# Patient Record
Sex: Female | Born: 1990 | Race: White | Hispanic: No | Marital: Married | Smoking: Current every day smoker
Health system: Southern US, Community
[De-identification: ages and names within clinical notes are randomized; demographics above are authoritative.]

## PROBLEM LIST (undated history)

## (undated) ENCOUNTER — Inpatient Hospital Stay: Payer: Self-pay

## (undated) DIAGNOSIS — F329 Major depressive disorder, single episode, unspecified: Secondary | ICD-10-CM

## (undated) DIAGNOSIS — E669 Obesity, unspecified: Secondary | ICD-10-CM

## (undated) DIAGNOSIS — F419 Anxiety disorder, unspecified: Secondary | ICD-10-CM

## (undated) DIAGNOSIS — E785 Hyperlipidemia, unspecified: Secondary | ICD-10-CM

## (undated) DIAGNOSIS — T8859XA Other complications of anesthesia, initial encounter: Secondary | ICD-10-CM

## (undated) DIAGNOSIS — F32A Depression, unspecified: Secondary | ICD-10-CM

## (undated) DIAGNOSIS — T4145XA Adverse effect of unspecified anesthetic, initial encounter: Secondary | ICD-10-CM

## (undated) HISTORY — DX: Depression, unspecified: F32.A

## (undated) HISTORY — DX: Obesity, unspecified: E66.9

## (undated) HISTORY — DX: Major depressive disorder, single episode, unspecified: F32.9

## (undated) HISTORY — DX: Hyperlipidemia, unspecified: E78.5

## (undated) HISTORY — DX: Anxiety disorder, unspecified: F41.9

## (undated) HISTORY — PX: ANKLE SURGERY: SHX546

## (undated) HISTORY — PX: TONSILLECTOMY: SUR1361

## (undated) HISTORY — PX: FRACTURE SURGERY: SHX138

---
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
## 2012-01-23 ENCOUNTER — Emergency Department: Payer: Self-pay | Admitting: Emergency Medicine

## 2012-01-23 LAB — CBC WITH DIFFERENTIAL/PLATELET
Basophil #: 0 10*3/uL (ref 0.0–0.1)
Eosinophil %: 1.7 %
HCT: 37 % (ref 35.0–47.0)
Lymphocyte #: 2.4 10*3/uL (ref 1.0–3.6)
Lymphocyte %: 28.6 %
Monocyte %: 5.2 %
Neutrophil %: 64.1 %
Platelet: 189 10*3/uL (ref 150–440)
RBC: 4.44 10*6/uL (ref 3.80–5.20)
RDW: 14 % (ref 11.5–14.5)
WBC: 8.4 10*3/uL (ref 3.6–11.0)

## 2012-01-23 LAB — URINALYSIS, COMPLETE
Bacteria: NONE SEEN
Bilirubin,UR: NEGATIVE
Blood: NEGATIVE
RBC,UR: 1 /HPF (ref 0–5)
Specific Gravity: 1.02 (ref 1.003–1.030)
Squamous Epithelial: 11

## 2012-01-23 LAB — BASIC METABOLIC PANEL
Anion Gap: 15 (ref 7–16)
BUN: 9 mg/dL (ref 7–18)
Calcium, Total: 9 mg/dL (ref 8.5–10.1)
Chloride: 103 mmol/L (ref 98–107)
Co2: 24 mmol/L (ref 21–32)
Creatinine: 0.67 mg/dL (ref 0.60–1.30)
EGFR (Non-African Amer.): >60
Glucose: 108 mg/dL — ABNORMAL HIGH (ref 65–99)
Osmolality: 282 (ref 275–301)

## 2012-01-23 LAB — TROPONIN I: Troponin-I: <0.02 ng/mL

## 2013-02-26 IMAGING — CT CT CHEST W/ CM
2 series · 15 of 31 positions shown, 19 images · IV contrast (APPLIED)
Comparison: none

REASON FOR EXAM: sudden onset pleuritiic cp in smoker on bcp
COMMENTS:

[Series 4: soft tissue · axial · 0.69mm/px · z∈[-325,-283]mm · 2 of 94 slices shown]
[im 8/94  mediastinal]
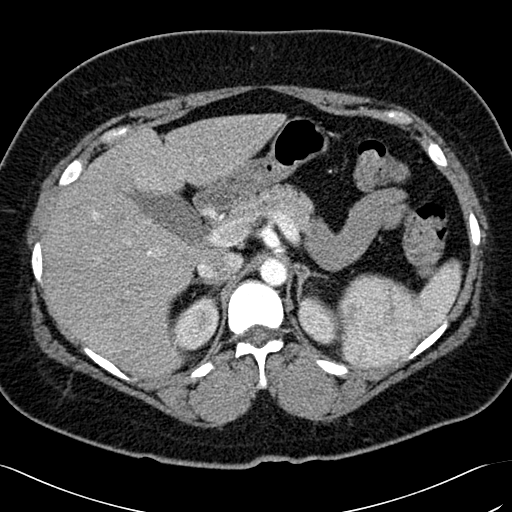
[im 22/94  mediastinal]
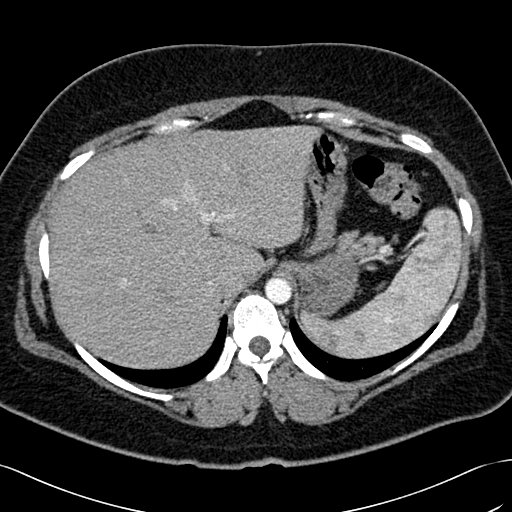

[Series 5: lung windows · axial · 0.69mm/px · z∈[-319,-88]mm · 13 of 92 slices shown, 17 images]
[im 8/92  mediastinal]
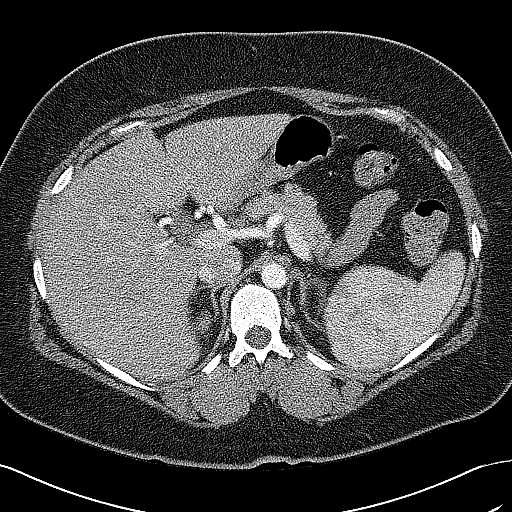
[im 8/92  lung]
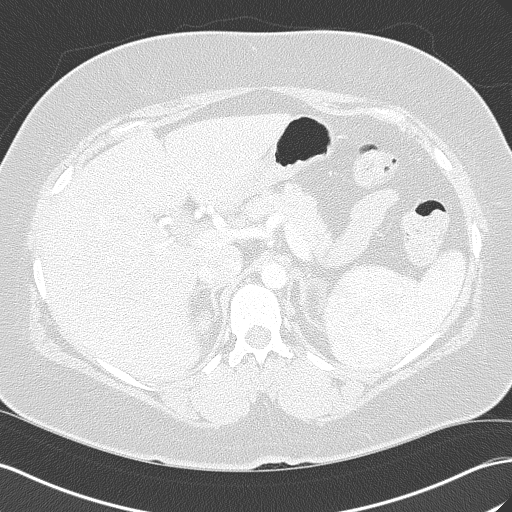
[im 15/92  lung]
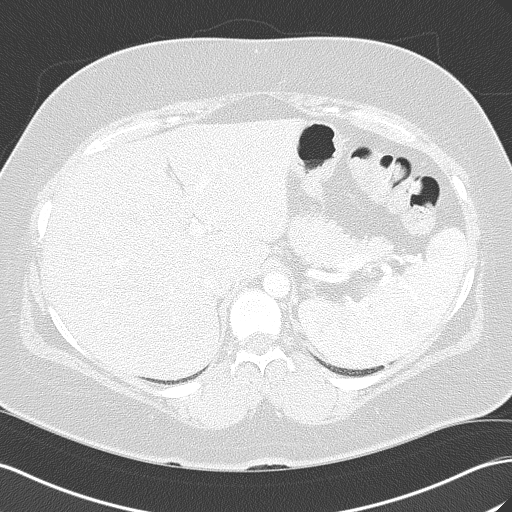
[im 22/92  lung]
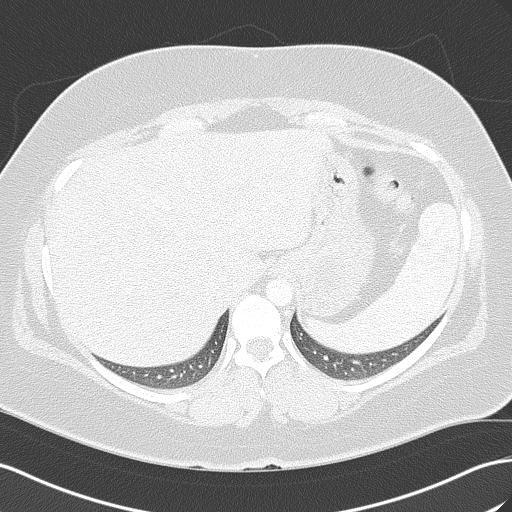
[im 29/92  lung]
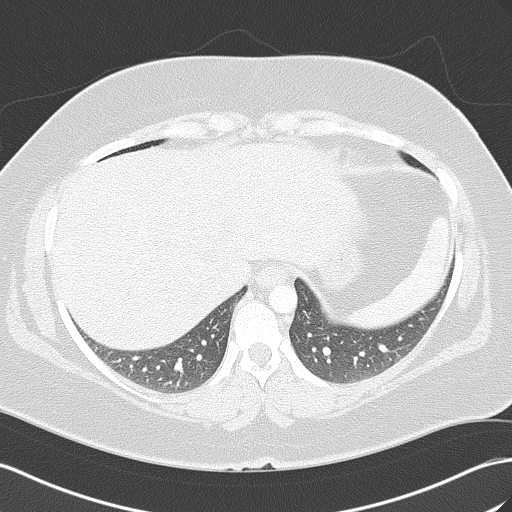
[im 36/92  mediastinal]
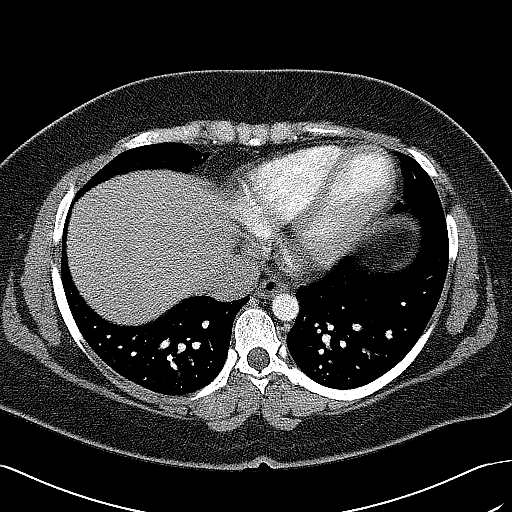
[im 36/92  lung]
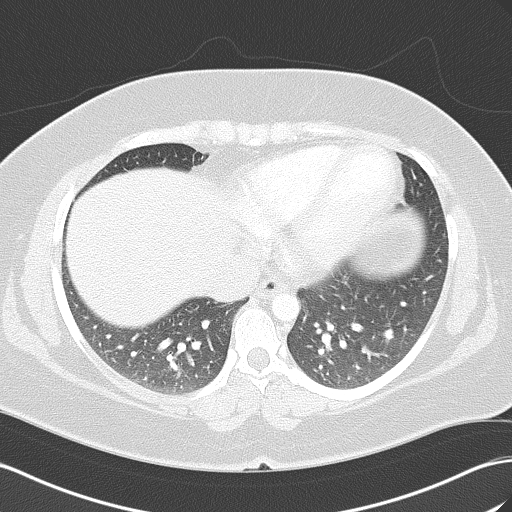
[im 43/92  lung]
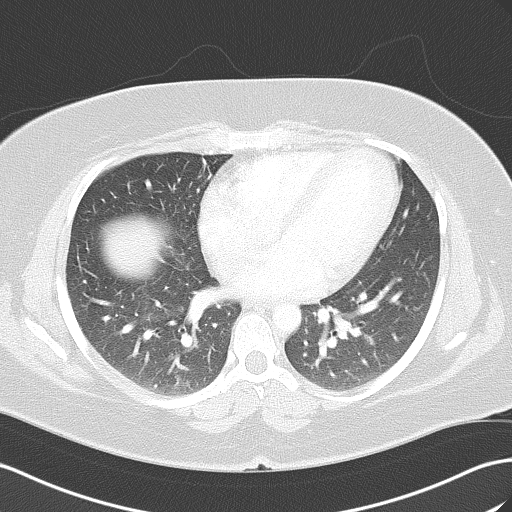
[im 46/92  lung]
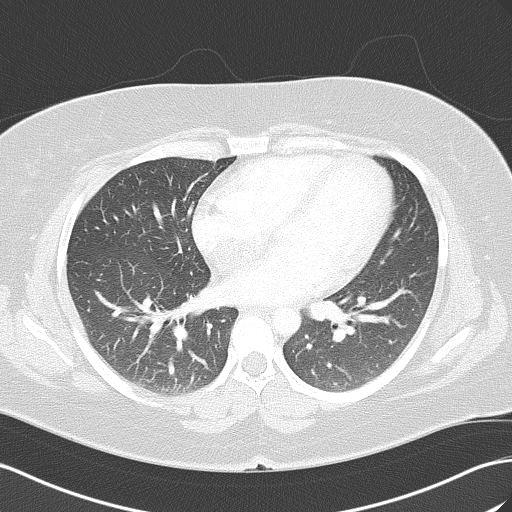
[im 50/92  lung]
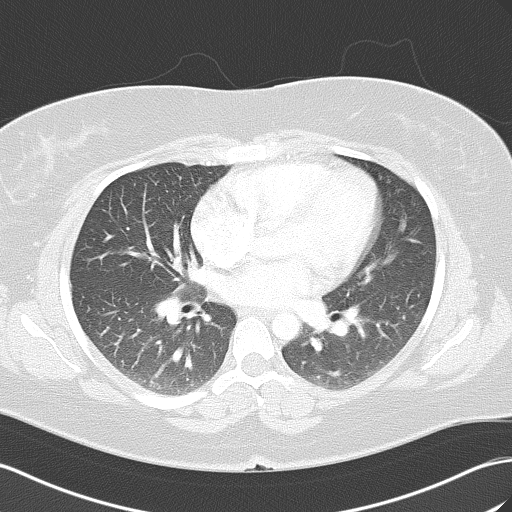
[im 57/92  mediastinal]
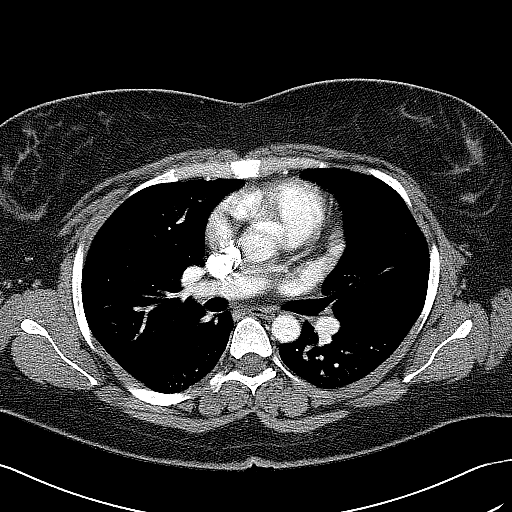
[im 57/92  lung]
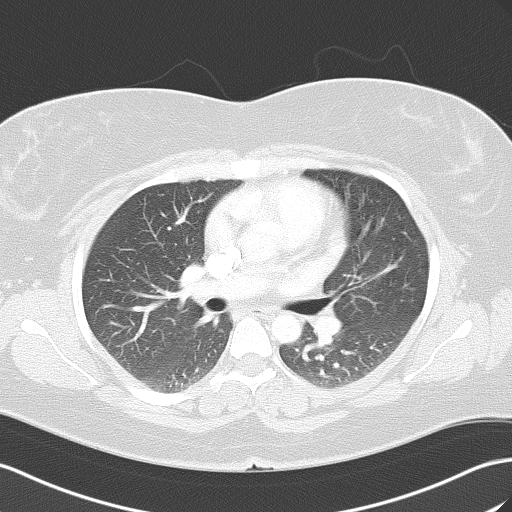
[im 64/92  lung]
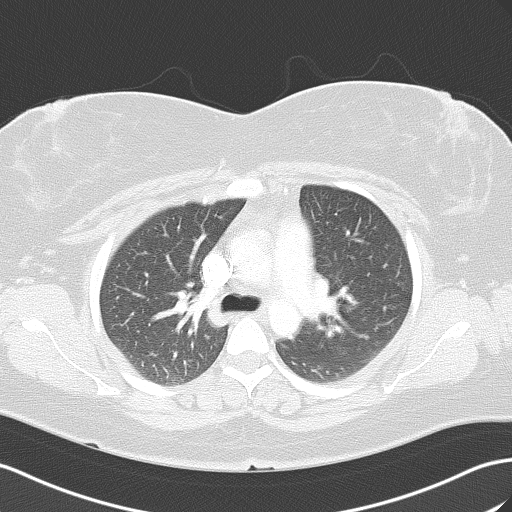
[im 71/92  lung]
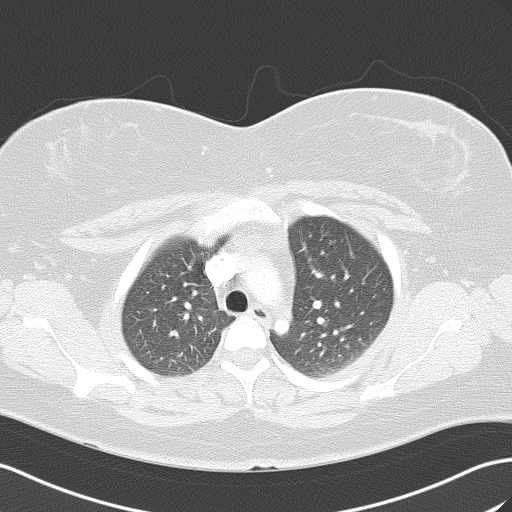
[im 78/92  lung]
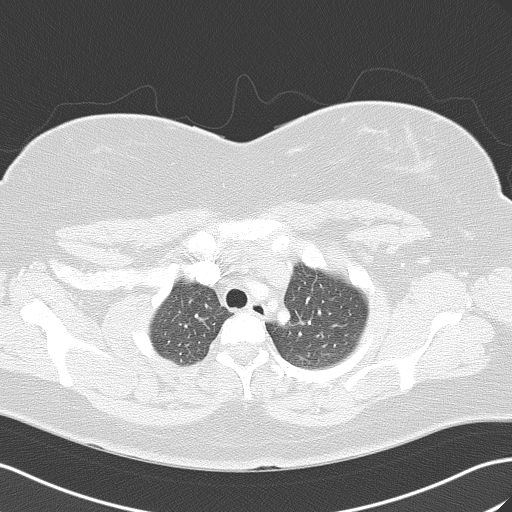
[im 85/92  mediastinal]
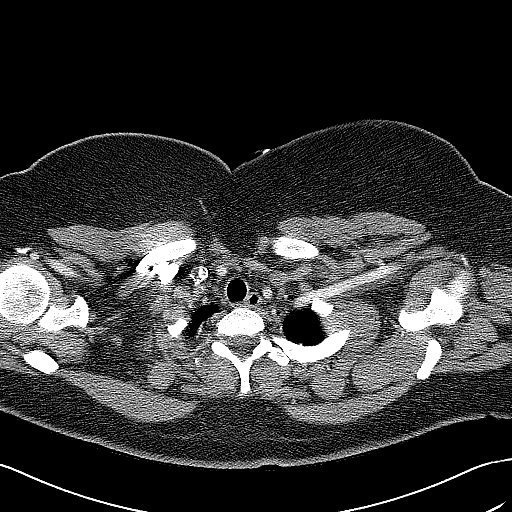
[im 85/92  lung]
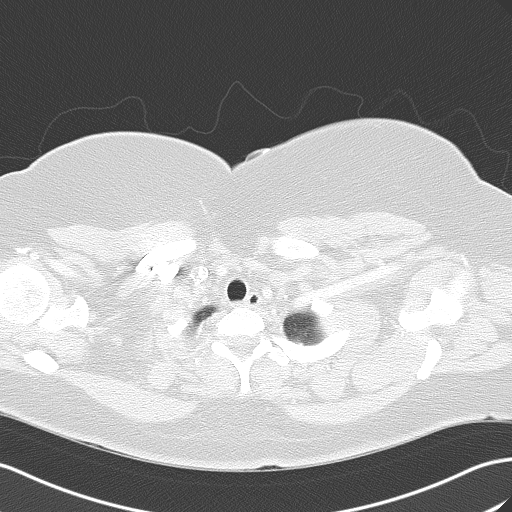

[15 of 31 positions shown; findings below may reference images not displayed]

PROCEDURE:     CT  - CT CHEST (FOR PE) W  - January 23, 2012  [DATE]

RESULT:     Axial CT scanning was performed through the chest with
reconstructions at 3 mm intervals and slice thicknesses following
intravenous administration of 100 cc of Jsovue-BH4. Review of multiplanar
reconstructed images separately on the VIA monitor.

The cardiac chambers are enlarged. The caliber of the thoracic aorta is
normal. Contrast within the pulmonary arterial tree is normal in appearance.
I see no pleural nor pericardial effusion. There is no pneumothorax nor
pneumomediastinum.

At lung window settings I see no interstitial nor alveolar infiltrate. There
is a 3 mm diameter subpleural nodule in the right upper lobe anteriorly on
image 34. The thoracic vertebral bodies are preserved in height. Within the
upper abdomen the observed portions of the liver and spleen are normal. I
see no adrenal masses.
IMPRESSION: 1. I do not see evidence of acute pulmonary embolism.
2. There is mild enlargement of the cardiac chambers without evidence of CHF.

A preliminary report was sent to the [HOSPITAL] the conclusion
of the study.

## 2015-08-03 ENCOUNTER — Ambulatory Visit (INDEPENDENT_AMBULATORY_CARE_PROVIDER_SITE_OTHER): Payer: 59 | Admitting: Primary Care

## 2015-08-03 ENCOUNTER — Encounter: Payer: Self-pay | Admitting: Primary Care

## 2015-08-03 ENCOUNTER — Encounter: Payer: Self-pay | Admitting: Radiology

## 2015-08-03 ENCOUNTER — Encounter (INDEPENDENT_AMBULATORY_CARE_PROVIDER_SITE_OTHER): Payer: Self-pay

## 2015-08-03 VITALS — BP 116/72 | HR 90 | Temp 97.7°F | Ht 68.0 in | Wt 244.8 lb

## 2015-08-03 DIAGNOSIS — R05 Cough: Secondary | ICD-10-CM

## 2015-08-03 DIAGNOSIS — F418 Other specified anxiety disorders: Secondary | ICD-10-CM

## 2015-08-03 DIAGNOSIS — F419 Anxiety disorder, unspecified: Secondary | ICD-10-CM

## 2015-08-03 DIAGNOSIS — R059 Cough, unspecified: Secondary | ICD-10-CM

## 2015-08-03 DIAGNOSIS — F329 Major depressive disorder, single episode, unspecified: Secondary | ICD-10-CM | POA: Insufficient documentation

## 2015-08-03 MED ORDER — SERTRALINE HCL 50 MG PO TABS: 50.0000 mg | ORAL_TABLET | Freq: Every day | ORAL | Status: DC

## 2015-08-03 MED ORDER — ALPRAZOLAM 0.5 MG PO TABS: 0.5000 mg | ORAL_TABLET | Freq: Two times a day (BID) | ORAL | Status: DC | PRN

## 2015-08-03 MED ORDER — BENZONATATE 200 MG PO CAPS: 200.0000 mg | ORAL_CAPSULE | Freq: Three times a day (TID) | ORAL | Status: DC | PRN

## 2015-08-03 NOTE — Patient Instructions (Addendum)
Start Sertraline (Zoloft) tablets for depression and anxiety. Take 1/2 tablet by mouth once daily for 6 days, then take 1 full tablet thereafter.  Use alprazolam sparingly as needed for anxiety. We will send this to mail order. The goal is to wean you back from using this medication.  You may take the Benzonatate capsules for cough. Take 1 capsule by mouth three times daily as needed for cough.  Follow up in 6 weeks for re-evaluation of depression and anxiety.  It was a pleasure to meet you today! Please don't hesitate to call me with any questions. Welcome to [facility]!

## 2015-08-03 NOTE — Assessment & Plan Note (Signed)
History of both.  Once treated on citalopram and wellbutrin with improvement, has not been on these meds in years. She is currently managed on alprazolam once to twice daily. The goal is to wean off alprazolam. Start Zoloft 50 mg for anxiety and depression. Refill provided for xanax, UDS and controlled substance contract obtained. Discussed potential side effects of Zoloft, She is to follow up in 6 weeks for re-evaluation.

## 2015-08-03 NOTE — Progress Notes (Signed)
Pre visit review using our clinic review tool, if applicable. No additional management support is needed unless otherwise documented below in the visit note. 

## 2015-08-03 NOTE — Progress Notes (Signed)
Subjective:    Patient ID: Terry Zhang, female    DOB: 1991/02/26, 24 y.o.   MRN: [id number]  HPI  Terry Zhang is a 24 year old female who presents today to establish care and discuss the problems mentioned below. Will obtain old records.  1) Generalized Anxiety Disorder: She has daily worry, difficulty controlling the worry, will get muscle tension, and difficulty sleeping at night. She will take alprazolam 0.5 mg once in the morning and again at night, but mostly will take it once daily. She was once managed on citalopram and wellbutrin with improvement in depression and anxiety. PHQ-9 score of 14 in office. Denies SI/HI  2) Cough: Started 3 weeks ago. Was seen at work clinic and prescribed Augmentin and prednisone. She's starting to feel better overall but continues to cough. She was not provided anything for cough during her prior visit at work clinic.  Review of Systems  Constitutional: Negative for fever and chills.  HENT: Negative for congestion, ear pain, sinus pressure and sore throat.   Respiratory: Positive for cough and chest tightness. Negative for shortness of breath.   Cardiovascular: Negative for chest pain.  Gastrointestinal: Negative for diarrhea and constipation.  Genitourinary: Negative for difficulty urinating.       Regular periods  Musculoskeletal: Negative for myalgias and arthralgias.  Skin: Negative for rash.  Neurological: Negative for dizziness and headaches.  Psychiatric/Behavioral: Negative for suicidal ideas. The patient is nervous/anxious.        See HPI       Past Medical History  Diagnosis Date  . Depression   . Hyperlipidemia     History   Social History  . Marital Status: Unknown    Spouse Name: N/A  . Number of Children: N/A  . Years of Education: N/A   Occupational History  . Not on file.   Social History Main Topics  . Smoking status: Never Smoker   . Smokeless tobacco: Not on file  . Alcohol Use: No  . Drug Use: Not  on file  . Sexual Activity: Not on file   Other Topics Concern  . Not on file   Social History Narrative   Married.   1 daughter.   Works as a Writer at [employer].   Once enjoyed playing sports, going to [employer], swimming.    Past Surgical History  Procedure Laterality Date  . Breast biopsy  2003  . Appendectomy  2008    Family History  Problem Relation Age of Onset  . Arthritis Mother   . Cancer Mother     ovary  . Hyperlipidemia Mother   . Mental illness Mother   . Diabetes Mother   . Hyperlipidemia Father   . Hypertension Father   . Mental illness Father   . Mental illness Maternal Grandfather   . Diabetes Maternal Grandfather     No Known Allergies  No current outpatient prescriptions on file prior to visit.   No current facility-administered medications on file prior to visit.    BP 116/72 mmHg  Pulse 90  Temp(Src) 97.7 F (36.5 C) (Oral)  Ht  (1.727 m)  Wt 244 lb 12.8 oz (111.041 kg)  BMI 37.23 kg/m2  SpO2 95%  LMP 07/29/2015    Objective:   Physical Exam  Constitutional: She is oriented to person, place, and time. She appears well-nourished.  Cough present on exam  HENT:  Right Ear: Tympanic membrane and ear canal normal.  Left Ear: Tympanic membrane  and ear canal normal.  Nose: Right sinus exhibits no maxillary sinus tenderness and no frontal sinus tenderness. Left sinus exhibits no maxillary sinus tenderness and no frontal sinus tenderness.  Mouth/Throat: Oropharynx is clear and moist.  Eyes: Conjunctivae are normal. Pupils are equal, round, and reactive to light.  Neck: Neck supple.  Cardiovascular: Normal rate and regular rhythm.   Pulmonary/Chest: Effort normal. She has rhonchi in the left upper field.  Lymphadenopathy:    She has no cervical adenopathy.  Neurological: She is alert and oriented to person, place, and time.  Skin: Skin is warm and dry.  Psychiatric: She has a normal mood and affect.            Assessment & Plan:  Cough:  Present for 3 weeks. Currently being treated with Augmentin and prednisone. Will send RX for tessalon pearls for daytime cough as it interferes with work. Push fluids. Follow up PRN

## 2015-09-15 ENCOUNTER — Ambulatory Visit: Payer: 59 | Admitting: Primary Care

## 2015-09-28 ENCOUNTER — Ambulatory Visit (INDEPENDENT_AMBULATORY_CARE_PROVIDER_SITE_OTHER): Payer: 59 | Admitting: Primary Care

## 2015-09-28 ENCOUNTER — Encounter (INDEPENDENT_AMBULATORY_CARE_PROVIDER_SITE_OTHER): Payer: Self-pay

## 2015-09-28 ENCOUNTER — Encounter: Payer: Self-pay | Admitting: Primary Care

## 2015-09-28 VITALS — BP 114/70 | HR 101 | Temp 98.0°F | Ht 68.0 in | Wt 247.1 lb

## 2015-09-28 DIAGNOSIS — F419 Anxiety disorder, unspecified: Principal | ICD-10-CM

## 2015-09-28 DIAGNOSIS — F329 Major depressive disorder, single episode, unspecified: Secondary | ICD-10-CM

## 2015-09-28 DIAGNOSIS — F418 Other specified anxiety disorders: Secondary | ICD-10-CM | POA: Diagnosis not present

## 2015-09-28 MED ORDER — SERTRALINE HCL 100 MG PO TABS: 100.0000 mg | ORAL_TABLET | Freq: Every day | ORAL | Status: DC

## 2015-09-28 NOTE — Patient Instructions (Signed)
Start taking Sertraline (Zoloft) 100 mg daily. You may take 2 of your 50 mg tablets until your bottle is empty.  Continue to use Xanax as needed with a goal of weaning off.  Please schedule a follow up appointment in 1 month.  It was a pleasure to see you today!

## 2015-09-28 NOTE — Progress Notes (Signed)
Pre visit review using our clinic review tool, if applicable. No additional management support is needed unless otherwise documented below in the visit note. 

## 2015-09-28 NOTE — Progress Notes (Signed)
Subjective:    Patient ID: Terry Zhang, female    DOB: Dec 28, 1991, 24 y.o.   MRN: [id number]  HPI  Terry Zhang is a 24 year old female who presents today for follow up of anxiety and depression. She was evaluated as a new patient in early August 2016 with reports of anxiety and sadness. She had a PHQ 9 score of 14 and also met criteria for GAD. She was initiated on Zoloft 50 mg with a refill provided for Xanax (previously taking).  Since her last visit she feels improved, but continues to feel anxious daily. She's engaging in more activities and is able to reduce anxiety regarding "the little things". She's communicating more with her husband regarding things that bother her. Denies headaches, GI upset or nausea. She is still requiring Xanax once to twice daily on average. Overall she's noticed improvement but doesn't feel 100% better and managed.   Review of Systems  Respiratory: Negative for shortness of breath.   Cardiovascular: Negative for chest pain.  Gastrointestinal: Negative for nausea and abdominal pain.  Neurological: Negative for headaches.  Psychiatric/Behavioral: Negative for suicidal ideas. The patient is nervous/anxious.        Past Medical History  Diagnosis Date  . Depression   . Hyperlipidemia     Social History   Social History  . Marital Status: Unknown    Spouse Name: N/A  . Number of Children: N/A  . Years of Education: N/A   Occupational History  . Not on file.   Social History Main Topics  . Smoking status: Never Smoker   . Smokeless tobacco: Not on file  . Alcohol Use: No  . Drug Use: Not on file  . Sexual Activity: Not on file   Other Topics Concern  . Not on file   Social History Narrative   Married.   1 daughter.   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   Once enjoyed playing sports, going to [employer], swimming.    Past Surgical History  Procedure Laterality Date  . Breast biopsy  2003  . Appendectomy  2008     Family History  Problem Relation Age of Onset  . Arthritis Mother   . Cancer Mother     ovary  . Hyperlipidemia Mother   . Mental illness Mother   . Diabetes Mother   . Hyperlipidemia Father   . Hypertension Father   . Mental illness Father   . Mental illness Maternal Grandfather   . Diabetes Maternal Grandfather     No Known Allergies  Current Outpatient Prescriptions on File Prior to Visit  Medication Sig Dispense Refill  . ALPRAZolam (XANAX) 0.5 MG tablet Take 1 tablet (0.5 mg total) by mouth 2 (two) times daily as needed for anxiety or sleep. 180 tablet 0  . benzonatate (TESSALON) 200 MG capsule Take 1 capsule (200 mg total) by mouth 3 (three) times daily as needed. 21 capsule 0  . ibuprofen (ADVIL,MOTRIN) 200 MG tablet Take 200 mg by mouth.    . norgestimate-ethinyl estradiol (PREVIFEM) 0.25-35 MG-MCG tablet daily.     No current facility-administered medications on file prior to visit.    BP 114/70 mmHg  Pulse 101  Temp(Src) 98 F (36.7 C) (Oral)  Ht '5\' 8"'  (1.727 m)  Wt 247 lb 1.9 oz (112.093 kg)  BMI 37.58 kg/m2  SpO2 97%  LMP 09/24/2015    Objective:   Physical Exam  Constitutional: She appears well-nourished.  Cardiovascular: Normal rate and  regular rhythm.   Pulmonary/Chest: Effort normal and breath sounds normal.  Skin: Skin is warm and dry.  Psychiatric: She has a normal mood and affect.          Assessment & Plan:

## 2015-09-28 NOTE — Assessment & Plan Note (Signed)
Improved with Zoloft, but not completely at goal. Still requiring xanax once to twice daily. Denies GI upset, headaches, nausea. Increase to 100 mg today. Explained the goal is to wean off xanax over time. Follow up in 1 month.

## 2015-10-11 ENCOUNTER — Other Ambulatory Visit: Payer: Self-pay | Admitting: Primary Care

## 2015-10-11 DIAGNOSIS — F32A Depression, unspecified: Secondary | ICD-10-CM

## 2015-10-11 DIAGNOSIS — F329 Major depressive disorder, single episode, unspecified: Secondary | ICD-10-CM

## 2015-10-11 DIAGNOSIS — F419 Anxiety disorder, unspecified: Principal | ICD-10-CM

## 2015-10-11 MED ORDER — ALPRAZOLAM 0.5 MG PO TABS: 0.5000 mg | ORAL_TABLET | Freq: Two times a day (BID) | ORAL | Status: DC | PRN

## 2015-10-11 NOTE — Telephone Encounter (Signed)
Faxed Rx to [facility] at [phone]

## 2015-10-11 NOTE — Telephone Encounter (Signed)
Received faxed refill request from [facility] for   Alprazolam (XANAX) 0.5 MG tablet 08/03/2015   Take 1 tablet (0.5 mg total) by mouth 2 (two) times daily as needed for anxiety or sleep.  Dispense 180  Refill  0  Last prescribed on 08/03/2015. Last seen on 09/28/15. Follow up on 10/27/15.

## 2015-10-27 ENCOUNTER — Ambulatory Visit: Payer: 59 | Admitting: Primary Care

## 2015-11-07 ENCOUNTER — Encounter: Payer: Self-pay | Admitting: Primary Care

## 2015-11-07 ENCOUNTER — Ambulatory Visit (INDEPENDENT_AMBULATORY_CARE_PROVIDER_SITE_OTHER): Payer: 59 | Admitting: Primary Care

## 2015-11-07 VITALS — BP 116/74 | HR 107 | Temp 98.3°F | Ht 68.0 in | Wt 251.8 lb

## 2015-11-07 DIAGNOSIS — F418 Other specified anxiety disorders: Secondary | ICD-10-CM

## 2015-11-07 DIAGNOSIS — F419 Anxiety disorder, unspecified: Principal | ICD-10-CM

## 2015-11-07 DIAGNOSIS — F329 Major depressive disorder, single episode, unspecified: Secondary | ICD-10-CM

## 2015-11-07 NOTE — Progress Notes (Signed)
Pre visit review using our clinic review tool, if applicable. No additional management support is needed unless otherwise documented below in the visit note. 

## 2015-11-07 NOTE — Assessment & Plan Note (Signed)
Improved since increase in Zoloft to 100 mg. Participating in family activities, improved focus and participation at work. Continue Zoloft 100 mg and Xanax PRN. Follow up in 3 months.

## 2015-11-07 NOTE — Patient Instructions (Signed)
Continue Zoloft 100 mg tablets. Continue Xanax as needed for anxiety.  Follow up in 3 months for re-evaluation.  It was a pleasure to see you today!

## 2015-11-07 NOTE — Progress Notes (Signed)
Subjective:    Patient ID: Terry Zhang, female    DOB: 1991/04/14, 24 y.o.   MRN: [id number]  HPI  [name] is a 24 year old female who presents today for follow up of anxiety and depression. She was re-evaluated in late September 2016 as she was initiated on Zoloft 50 mg in early August. Her dose last visit was increased to 100 mg as she had noticed improvement overall, but was still requiring frequent use of her Xanax.   Since her last visit she's feeling much improved. She's engaging in activites with her family and friends. She is feeling calmer at work and is able to participate in meetings. 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 Denies GI upset, nausea, headaches, SI/HI. She's requiring her Xanax only at bedtime, and very rarely during the day.   Review of Systems  Gastrointestinal: Negative for nausea and abdominal pain.  Neurological: Negative for headaches.  Psychiatric/Behavioral: Negative for suicidal ideas and sleep disturbance. The patient is not nervous/anxious.        Past Medical History  Diagnosis Date  . Depression   . Hyperlipidemia     Social History   Social History  . Marital Status: Unknown    Spouse Name: N/A  . Number of Children: N/A  . Years of Education: N/A   Occupational History  . Not on file.   Social History Main Topics  . Smoking status: Never Smoker   . Smokeless tobacco: Not on file  . Alcohol Use: No  . Drug Use: Not on file  . Sexual Activity: Not on file   Other Topics Concern  . Not on file   Social History Narrative   Married.   1 daughter.   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   Once enjoyed playing sports, going to [employer], swimming.    Past Surgical History  Procedure Laterality Date  . Breast biopsy  2003  . Appendectomy  2008    Family History  Problem Relation Age of Onset  . Arthritis Mother   . Cancer Mother     ovary  . Hyperlipidemia Mother   . Mental  illness Mother   . Diabetes Mother   . Hyperlipidemia Father   . Hypertension Father   . Mental illness Father   . Mental illness Maternal Grandfather   . Diabetes Maternal Grandfather     No Known Allergies  Current Outpatient Prescriptions on File Prior to Visit  Medication Sig Dispense Refill  . ALPRAZolam (XANAX) 0.5 MG tablet Take 1 tablet (0.5 mg total) by mouth 2 (two) times daily as needed for anxiety or sleep. 180 tablet 0  . ibuprofen (ADVIL,MOTRIN) 200 MG tablet Take 200 mg by mouth.    . norgestimate-ethinyl estradiol (PREVIFEM) 0.25-35 MG-MCG tablet daily.    . sertraline (ZOLOFT) 100 MG tablet Take 1 tablet (100 mg total) by mouth daily. 30 tablet 5   No current facility-administered medications on file prior to visit.    BP 116/74 mmHg  Pulse 107  Temp(Src) 98.3 F (36.8 C) (Oral)  Ht 5\' 8"  (1.727 m)  Wt 251 lb 12.8 oz (114.216 kg)  BMI 38.30 kg/m2  SpO2 97%  LMP 11/01/2015    Objective:   Physical Exam  Constitutional: She appears well-nourished.  Cardiovascular: Normal rate and regular rhythm.   Pulmonary/Chest: Effort normal and breath sounds normal.  Skin: Skin is warm and dry.  Psychiatric: She has a normal mood  and affect.          Assessment & Plan:

## 2015-12-08 ENCOUNTER — Telehealth: Payer: Self-pay | Admitting: Primary Care

## 2015-12-08 ENCOUNTER — Other Ambulatory Visit: Payer: Self-pay

## 2015-12-08 DIAGNOSIS — F329 Major depressive disorder, single episode, unspecified: Secondary | ICD-10-CM

## 2015-12-08 DIAGNOSIS — F419 Anxiety disorder, unspecified: Principal | ICD-10-CM

## 2015-12-08 MED ORDER — SERTRALINE HCL 100 MG PO TABS: 100.0000 mg | ORAL_TABLET | Freq: Every day | ORAL | Status: DC

## 2015-12-08 NOTE — Telephone Encounter (Signed)
Please notify Terry Zhang that I can authorize 1 month refill at twice daily. Once she's out we will decrease to once daily to help wean down. Should I send this into a local pharmacy since it will not be a 3 month supply? If so, then where?

## 2015-12-08 NOTE — Telephone Encounter (Signed)
Pt left v/m requesting refill alprazolam(last refilled # 180 on 10/11/15) ? Too early and sertraline(last refilled #30 x 5 on 09/28/15) to [facility]. Pt request cb. Please advise.last seen 11/07/15.

## 2015-12-08 NOTE — Telephone Encounter (Signed)
Called and notified patient of Kate's comments. Patient verbalized understanding. Patient stated that she is taking it BID and would like refill of the Xanax.

## 2015-12-11 ENCOUNTER — Telehealth: Payer: Self-pay | Admitting: Primary Care

## 2015-12-11 DIAGNOSIS — F419 Anxiety disorder, unspecified: Principal | ICD-10-CM

## 2015-12-11 DIAGNOSIS — F329 Major depressive disorder, single episode, unspecified: Secondary | ICD-10-CM

## 2015-12-11 MED ORDER — ALPRAZOLAM 0.5 MG PO TABS: 0.5000 mg | ORAL_TABLET | Freq: Two times a day (BID) | ORAL | Status: DC | PRN

## 2015-12-11 NOTE — Addendum Note (Signed)
Addended by: Tawnya CrookSAMBATH, Wayburn Shaler on: 12/11/2015 01:28 PM   Modules accepted: Orders

## 2015-12-11 NOTE — Telephone Encounter (Signed)
Called and notified patient of Kate's comments. Patient verbalized understanding. [facility] [place]

## 2015-12-11 NOTE — Telephone Encounter (Signed)
Printed the Rx. [facility]

## 2015-12-11 NOTE — Telephone Encounter (Signed)
I spoke with Ms. Brandow and will authorize [facility] supply [facility]. We will follow up in February as scheduled. Discussed how to wean down on medication. Johny DrillingChan, will you send in 3 month suppy [facility]? Same directions, no refills.  Thanks.

## 2015-12-11 NOTE — Telephone Encounter (Signed)
Faxed Rx to [facility].

## 2015-12-11 NOTE — Addendum Note (Signed)
Addended by: Tawnya CrookSAMBATH, Siomara Burkel on: 12/11/2015 02:31 PM   Modules accepted: Orders

## 2015-12-19 NOTE — Telephone Encounter (Signed)
Melissa with [facility] left v/m requesting supervising physician for Mayra ReelKate Clark NP. Ref # [id number]. Called and spoke with Savane at [facility] and Dr Daphine DeutscherBedsole's DEA and NPI given.

## 2016-02-07 ENCOUNTER — Encounter: Payer: Self-pay | Admitting: *Deleted

## 2016-02-07 ENCOUNTER — Ambulatory Visit (INDEPENDENT_AMBULATORY_CARE_PROVIDER_SITE_OTHER): Payer: 59 | Admitting: Primary Care

## 2016-02-07 ENCOUNTER — Encounter: Payer: Self-pay | Admitting: Primary Care

## 2016-02-07 VITALS — BP 116/70 | HR 91 | Temp 98.1°F | Ht 68.0 in | Wt 256.0 lb

## 2016-02-07 DIAGNOSIS — F418 Other specified anxiety disorders: Secondary | ICD-10-CM | POA: Diagnosis not present

## 2016-02-07 DIAGNOSIS — F32A Depression, unspecified: Secondary | ICD-10-CM

## 2016-02-07 DIAGNOSIS — F329 Major depressive disorder, single episode, unspecified: Secondary | ICD-10-CM

## 2016-02-07 DIAGNOSIS — F419 Anxiety disorder, unspecified: Principal | ICD-10-CM

## 2016-02-07 MED ORDER — SERTRALINE HCL 50 MG PO TABS: 50.0000 mg | ORAL_TABLET | Freq: Every day | ORAL | Status: DC

## 2016-02-07 NOTE — Patient Instructions (Addendum)
Start taking Zoloft 150 mg for anxiety and depression. Take both tablets once every morning.   Please call me in 4 weeks with an update.  Try to wean back off of your Alprazolam as discussed. Try taking Melatonin 1 hour prior to bedtime to help with sleep.  Please schedule a follow up appointment in 3 months.  It was a pleasure to see you today!

## 2016-02-07 NOTE — Progress Notes (Signed)
Pre visit review using our clinic review tool, if applicable. No additional management support is needed unless otherwise documented below in the visit note. 

## 2016-02-07 NOTE — Progress Notes (Signed)
Subjective:    Patient ID: Terry Zhang, female    DOB: Apr 03, 1991, 25 y.o.   MRN: [id number]  HPI  Terry Zhang is a 25 year old female who presents today for follow up of anxiety and depression. Initiated on Zoloft 50 mg and short term supply of Alprazolam in early August 2016. She is currently managed on Zoloft 100 mg and Alprazolam PRN. We discussed last visit that the goal would be to wean down/off of the Alprazolam.  Since her last visit she's taken on a new job and is adjusting. She is also going through stress with her young daughter who likely has a diagnosis of ADHD. She has been taking the Xanax three times weekly during the day, and at bedtime a few times weekly. She feels improved on the Zoloft, but doesn't feel quite at goal. Denies SI/HI.   Review of Systems  Gastrointestinal: Negative for nausea.  Neurological: Negative for headaches.  Psychiatric/Behavioral: Positive for sleep disturbance. Negative for suicidal ideas. The patient is nervous/anxious.        Past Medical History  Diagnosis Date  . Depression   . Hyperlipidemia     Social History   Social History  . Marital Status: Unknown    Spouse Name: N/A  . Number of Children: N/A  . Years of Education: N/A   Occupational History  . Not on file.   Social History Main Topics  . Smoking status: Never Smoker   . Smokeless tobacco: Not on file  . Alcohol Use: No  . Drug Use: Not on file  . Sexual Activity: Not on file   Other Topics Concern  . Not on file   Social History Narrative   Married.   1 daughter.   Works as a Writer at [employer].   Once enjoyed playing sports, going to [employer], swimming.    Past Surgical History  Procedure Laterality Date  . Breast biopsy  2003  . Appendectomy  2008    Family History  Problem Relation Age of Onset  . Arthritis Mother   . Cancer Mother     ovary  . Hyperlipidemia Mother   . Mental illness Mother   . Diabetes Mother   .  Hyperlipidemia Father   . Hypertension Father   . Mental illness Father   . Mental illness Maternal Grandfather   . Diabetes Maternal Grandfather     No Known Allergies  Current Outpatient Prescriptions on File Prior to Visit  Medication Sig Dispense Refill  . ALPRAZolam (XANAX) 0.5 MG tablet Take 1 tablet (0.5 mg total) by mouth 2 (two) times daily as needed for anxiety or sleep. 180 tablet 0  . ibuprofen (ADVIL,MOTRIN) 200 MG tablet Take 200 mg by mouth.    . norgestimate-ethinyl estradiol (PREVIFEM) 0.25-35 MG-MCG tablet daily.    . sertraline (ZOLOFT) 100 MG tablet Take 1 tablet (100 mg total) by mouth daily. 90 tablet 1   No current facility-administered medications on file prior to visit.    BP 116/70 mmHg  Pulse 91  Temp(Src) 98.1 F (36.7 C) (Oral)  Ht  (1.727 m)  Wt 256 lb (116.121 kg)  BMI 38.93 kg/m2  SpO2 98%  LMP 01/14/2016    Objective:   Physical Exam  Constitutional: She appears well-nourished.  Cardiovascular: Normal rate and regular rhythm.   Pulmonary/Chest: Effort normal and breath sounds normal.  Skin: Skin is warm and dry.  Psychiatric: She has a normal mood and affect.  Assessment & Plan:

## 2016-02-07 NOTE — Assessment & Plan Note (Addendum)
Improved on Zoloft 100 and is working to wean off alprazolam, but doesn't seem quite at goal. Will increase Zoloft to 150 mg daily, continue to wean down on alprazolam. Next RX will be for once daily PRN dosing. She is agreeable. She is to update me in 1 month regarding Zoloft dose. Follow up in 3 months in office. UDS due today.

## 2016-02-23 ENCOUNTER — Encounter: Payer: Self-pay | Admitting: Primary Care

## 2016-02-23 DIAGNOSIS — E785 Hyperlipidemia, unspecified: Secondary | ICD-10-CM

## 2016-02-23 HISTORY — DX: Hyperlipidemia, unspecified: E78.5

## 2016-03-06 ENCOUNTER — Telehealth: Payer: Self-pay | Admitting: Primary Care

## 2016-03-06 DIAGNOSIS — F329 Major depressive disorder, single episode, unspecified: Secondary | ICD-10-CM

## 2016-03-06 DIAGNOSIS — F419 Anxiety disorder, unspecified: Principal | ICD-10-CM

## 2016-03-06 NOTE — Telephone Encounter (Signed)
Will you check on Terry Zhang? We increased her Zoloft to 150 mg last visit. How's she doing?

## 2016-03-06 NOTE — Telephone Encounter (Signed)
Message left for patient to return my call.  

## 2016-03-07 MED ORDER — ALPRAZOLAM 0.5 MG PO TABS: 0.5000 mg | ORAL_TABLET | Freq: Every day | ORAL | Status: DC | PRN

## 2016-03-07 NOTE — Telephone Encounter (Signed)
Called and spoken to patient regarding her medication. Patient stated that she doing good right now. Also patient would like to get her Xanax refill and taking 1 tablet daily.

## 2016-03-07 NOTE — Addendum Note (Signed)
Addended by: Tawnya CrookSAMBATH, Mahreen Schewe on: 03/07/2016 11:33 AM   Modules accepted: Orders

## 2016-03-18 ENCOUNTER — Other Ambulatory Visit: Payer: Self-pay | Admitting: Primary Care

## 2016-03-18 DIAGNOSIS — F419 Anxiety disorder, unspecified: Principal | ICD-10-CM

## 2016-03-18 DIAGNOSIS — F329 Major depressive disorder, single episode, unspecified: Secondary | ICD-10-CM

## 2016-03-18 NOTE — Telephone Encounter (Signed)
Electronically refill request for   sertraline (ZOLOFT) 50 MG tablet   Take 1 tablet (50 mg total) by mouth daily.  Dispense: 90 tablet   Refills: 0     Last prescribed on 02/07/2016. Last seen on 02/07/2016. Follow up on 05/14/2016.

## 2016-03-22 NOTE — Telephone Encounter (Signed)
I don't see any documentation that this was phoned in.  [facility] was probably [facility].  Do you happen to know?

## 2016-03-22 NOTE — Telephone Encounter (Signed)
Pt left v/m requesting cb; pt wants to know if xanax was called in 03/07/16.

## 2016-03-22 NOTE — Telephone Encounter (Signed)
I did not see documentation that Rx had been phoned in, but I did see that it had been refilled. Phoned into [facility] and patient notified.

## 2016-04-25 ENCOUNTER — Emergency Department: Payer: 59

## 2016-04-25 ENCOUNTER — Encounter: Payer: Self-pay | Admitting: Emergency Medicine

## 2016-04-25 ENCOUNTER — Emergency Department
Admission: EM | Admit: 2016-04-25 | Discharge: 2016-04-25 | Disposition: A | Discharge status: Home / Self Care | Payer: 59 | Attending: Emergency Medicine | Admitting: Emergency Medicine

## 2016-04-25 DIAGNOSIS — E785 Hyperlipidemia, unspecified: Secondary | ICD-10-CM | POA: Insufficient documentation

## 2016-04-25 DIAGNOSIS — N83209 Unspecified ovarian cyst, unspecified side: Secondary | ICD-10-CM

## 2016-04-25 DIAGNOSIS — R102 Pelvic and perineal pain: Secondary | ICD-10-CM

## 2016-04-25 DIAGNOSIS — R1030 Lower abdominal pain, unspecified: Secondary | ICD-10-CM | POA: Insufficient documentation

## 2016-04-25 DIAGNOSIS — F329 Major depressive disorder, single episode, unspecified: Secondary | ICD-10-CM | POA: Insufficient documentation

## 2016-04-25 LAB — URINALYSIS COMPLETE WITH MICROSCOPIC (ARMC ONLY)
BILIRUBIN URINE: NEGATIVE
GLUCOSE, UA: NEGATIVE mg/dL
HGB URINE DIPSTICK: NEGATIVE
KETONES UR: NEGATIVE mg/dL
NITRITE: NEGATIVE
Protein, ur: NEGATIVE mg/dL
Specific Gravity, Urine: 1.014 (ref 1.005–1.030)
pH: 5 (ref 5.0–8.0)

## 2016-04-25 LAB — BASIC METABOLIC PANEL
ANION GAP: 12 (ref 5–15)
BUN: 13 mg/dL (ref 6–20)
CHLORIDE: 107 mmol/L (ref 101–111)
CO2: 20 mmol/L — AB (ref 22–32)
CREATININE: 0.68 mg/dL (ref 0.44–1.00)
Calcium: 9.3 mg/dL (ref 8.9–10.3)
GFR calc non Af Amer: >60 mL/min (ref >60)
GLUCOSE: 114 mg/dL — AB (ref 65–99)
Potassium: 4 mmol/L (ref 3.5–5.1)
Sodium: 139 mmol/L (ref 135–145)

## 2016-04-25 LAB — CBC
HCT: 38.6 % (ref 35.0–47.0)
Hemoglobin: 13.3 g/dL (ref 12.0–16.0)
MCH: 29.1 pg (ref 26.0–34.0)
MCHC: 34.3 g/dL (ref 32.0–36.0)
MCV: 84.6 fL (ref 80.0–100.0)
PLATELETS: 217 10*3/uL (ref 150–440)
RBC: 4.56 MIL/uL (ref 3.80–5.20)
RDW: 13.3 % (ref 11.5–14.5)
WBC: 8.4 10*3/uL (ref 3.6–11.0)

## 2016-04-25 LAB — POCT PREGNANCY, URINE: PREG TEST UR: NEGATIVE

## 2016-04-25 MED ORDER — KETOROLAC TROMETHAMINE 30 MG/ML IJ SOLN
15.0000 mg | Freq: Once | INTRAMUSCULAR | Status: AC
  Administered 2016-04-25: 15 mg via INTRAVENOUS
  Filled 2016-04-25: qty 1

## 2016-04-25 MED ORDER — HYDROCODONE-ACETAMINOPHEN 5-325 MG PO TABS
1.0000 | ORAL_TABLET | ORAL | Status: AC
  Administered 2016-04-25: 1 via ORAL
  Filled 2016-04-25: qty 1

## 2016-04-25 MED ORDER — ONDANSETRON HCL 4 MG/2ML IJ SOLN
4.0000 mg | Freq: Once | INTRAMUSCULAR | Status: AC
  Administered 2016-04-25: 4 mg via INTRAVENOUS
  Filled 2016-04-25: qty 2

## 2016-04-25 MED ORDER — MORPHINE SULFATE (PF) 4 MG/ML IV SOLN
4.0000 mg | Freq: Once | INTRAVENOUS | Status: AC
Start: 2016-04-25 — End: 2016-04-25
  Administered 2016-04-25: 4 mg via INTRAVENOUS
  Filled 2016-04-25: qty 1

## 2016-04-25 MED ORDER — HYDROCODONE-ACETAMINOPHEN 5-325 MG PO TABS: 1.0000 | ORAL_TABLET | Freq: Four times a day (QID) | ORAL | Status: DC | PRN

## 2016-04-25 NOTE — Discharge Instructions (Signed)
You were seen in the emergency room for abdominal pain, and we suspect this is from a ovarian cyst on the right. It is important that you follow up closely with your primary care doctor or OBGYN in the next couple of days.  Please return to the emergency room right away if you are to develop a fever, severe nausea, your pain becomes severe or worsens, you are unable to keep food down, begin vomiting any dark or bloody fluid, you develop any dark or bloody stools, feel dehydrated, or other new concerns or symptoms arise.

## 2016-04-25 NOTE — ED Notes (Signed)
Patient ambulatory to triage with steady gait, without difficulty, appears uncomfortable; st awoke PTA with "cramping" pain to lower pain and pelvic region, no accomp symptoms

## 2016-04-25 NOTE — ED Notes (Signed)
Patient  Transported to [facility] 

## 2016-04-25 NOTE — ED Notes (Signed)
Pt reports an abrupt wake at 0400 this AM with severe lower pelvic pain radiating down in to genitalia region. Pt states she has had ovarian cyst in the past that have ruptured but the symptoms are not the same.

## 2016-04-25 NOTE — ED Provider Notes (Signed)
[facility] Emergency Department Provider Note  ____________________________________________  Time seen: Approximately 5:58 AM  I have reviewed the triage vital signs and the nursing notes.   HISTORY  Chief Complaint Back Pain and Abdominal Pain    HPI Terry Zhang is a 25 y.o. female with a past medical history of reportedly large ovarian cysts that have ruptured in the past presents with acute onset about 2 hours ago of severe lower abdominal/pelvic pain.  It awoke her from sleep and she states it is in the center of her lower abdomen and radiates into her genital region.  It does not feel similar to her prior ovarian cysts.  The pain is currently better than it was when it first started.  She describes the pain as sharp, aching, and cramping.She has had nausea but no vomiting.  She denies fever/chills, chest pain, shortness of breath, dysuria, hematuria, vaginal bleeding.  Pain is severe and nothing made it better or worse but it got better on its own over time.  She has some pain in her right flank as well.   Past Medical History  Diagnosis Date  . Depression   . Hyperlipidemia     Patient Active Problem List   Diagnosis Date Noted  . Anxiety and depression 08/03/2015    Past Surgical History  Procedure Laterality Date  . Breast biopsy  2003  . Appendectomy  2008  . Ankle surgery      Current Outpatient Rx  Name  Route  Sig  Dispense  Refill  . ALPRAZolam (XANAX) 0.5 MG tablet   Oral   Take 1 tablet (0.5 mg total) by mouth daily as needed for anxiety.   90 tablet   0   . ibuprofen (ADVIL,MOTRIN) 200 MG tablet   Oral   Take 200 mg by mouth.         . norgestimate-ethinyl estradiol (PREVIFEM) 0.25-35 MG-MCG tablet      daily.         . sertraline (ZOLOFT) 100 MG tablet   Oral   Take 1 tablet (100 mg total) by mouth daily.   90 tablet   1   . sertraline (ZOLOFT) 50 MG tablet      Take 1 tablet by mouth  daily   90  tablet   1     Allergies Review of patient's allergies indicates no known allergies.  Family History  Problem Relation Age of Onset  . Arthritis Mother   . Cancer Mother     ovary  . Hyperlipidemia Mother   . Mental illness Mother   . Diabetes Mother   . Hyperlipidemia Father   . Hypertension Father   . Mental illness Father   . Mental illness Maternal Grandfather   . Diabetes Maternal Grandfather     Social History Social History  Substance Use Topics  . Smoking status: Never Smoker   . Smokeless tobacco: None  . Alcohol Use: No    Review of Systems Constitutional: No fever/chills Eyes: No visual changes. ENT: No sore throat. Cardiovascular: Denies chest pain. Respiratory: Denies shortness of breath. Gastrointestinal: Lower abd vs pelvic pain (central).  Nausea, no vomiting.  Denies diarrhea. Genitourinary: Negative for dysuria, hematuria, vaginal bleeding. Musculoskeletal: some mild R flank pain Skin: Negative for rash. Neurological: Negative for headaches, focal weakness or numbness.  10-point ROS otherwise negative.  ____________________________________________   PHYSICAL EXAM:  VITAL SIGNS: ED Triage Vitals  Enc Vitals Group     BP  04/25/16 0524 122/76 mmHg     Pulse Rate 04/25/16 0524 113     Resp 04/25/16 0524 20     Temp 04/25/16 0524 97.7 F (36.5 C)     Temp Source 04/25/16 0524 Oral     SpO2 04/25/16 0524 98 %     Weight 04/25/16 0524 260 lb (117.935 kg)     Height 04/25/16 0524  (1.727 m)     Head Cir --      Peak Flow --      Pain Score 04/25/16 0523 10     Pain Loc --      Pain Edu? --      Excl. in GC? --     Constitutional: Alert and oriented. Well appearing but does appear uncomfortable Eyes: Conjunctivae are normal. PERRL. EOMI. Head: Atraumatic. Nose: No congestion/rhinnorhea. Mouth/Throat: Mucous membranes are moist.  Oropharynx non-erythematous. Neck: No stridor.  No meningeal signs.   Cardiovascular: Normal rate,  regular rhythm. Good peripheral circulation. Grossly normal heart sounds.   Respiratory: Normal respiratory effort.  No retractions. Lungs CTAB. Gastrointestinal: Soft with mild to moderate tenderness of lower abdomen without focal tenderness Genitourinary: Deferred in favor of emergent transvaginal U/S Musculoskeletal: No lower extremity tenderness nor edema. No gross deformities of extremities. Neurologic:  Normal speech and language. No gross focal neurologic deficits are appreciated.  Skin:  Skin is warm, dry and intact. No rash noted. Psychiatric: Mood and affect are normal. Speech and behavior are normal.  ____________________________________________   LABS (all labs ordered are listed, but only abnormal results are displayed)  Labs Reviewed  URINALYSIS COMPLETEWITH MICROSCOPIC ([facility] ONLY) - Abnormal; Notable for the following:    Color, Urine YELLOW (*)    APPearance CLOUDY (*)    Leukocytes, UA TRACE (*)    Bacteria, UA RARE (*)    Squamous Epithelial / LPF 6-30 (*)    All other components within normal limits  BASIC METABOLIC PANEL - Abnormal; Notable for the following:    CO2 20 (*)    Glucose, Bld 114 (*)    All other components within normal limits  URINE CULTURE  CBC  POCT PREGNANCY, URINE   ____________________________________________  EKG  None ____________________________________________  RADIOLOGY   No results found.  ____________________________________________   PROCEDURES  Procedure(s) performed: None  Critical Care performed: No ____________________________________________   INITIAL IMPRESSION / ASSESSMENT AND PLAN / ED COURSE  Pertinent labs & imaging results that were available during my care of the patient were reviewed by me and considered in my medical decision making (see chart for details).  Differential is broad but my main 2 items are kidney stone/renal colic versus ovarian torsion.  Her urinalysis came back while I was talking  to the patient and it is unremarkable except for some white blood cells and squamous cells.  I sent the urine to culture and him proceeding with an ultrasound evaluation of her ovaries including Dopplers.  Very low suspicion for PID, cervicitis, etc, given acute onset; minimal benefit to pelvic exam.  ----------------------------------------- 7:22 AM on 04/25/2016 -----------------------------------------  Transferring ED care to Dr. Fanny Bien to follow up ultrasound and reassess.  ____________________________________________  FINAL CLINICAL IMPRESSION(S) / ED DIAGNOSES  Final diagnoses:  Severe acute lower abdominal pain      NEW MEDICATIONS STARTED DURING THIS VISIT:  New Prescriptions   No medications on file      Note:  This document was prepared using Dragon voice recognition software and may include unintentional dictation  errors.   Loleta Roseory Dresean Beckel, MD 04/25/16 [id number]

## 2016-04-25 NOTE — ED Provider Notes (Signed)
-----------------------------------------   8:53 AM on 04/25/2016 -----------------------------------------  Patient reports her pain is improved, just starting to come back slightly.  She is awake alert no distress. On reexam she has minimal tenderness suprapubically and slightly in the right lower abdomen. No rebound or guarding. Labs reviewed, patient afebrile, and her symptoms as I discussed do not to be consistent with cause such as appendicitis but tender at present are rather abrupt onset of right lower pelvic pain.  Her transvaginal ultrasound demonstrates normal flow and no evidence of torsion to the right or left ovary.  Final result by Rad Results In Interface (04/25/16 07:47:49)   Narrative:   CLINICAL DATA: Pelvic pain for 1 day  EXAM: TRANSABDOMINAL ULTRASOUND OF PELVIS  DOPPLER ULTRASOUND OF OVARIES  TECHNIQUE: Transabdominal ultrasound examination of the pelvis was performed including evaluation of the uterus, ovaries, adnexal regions, and pelvic cul-de-sac.  Color and duplex Doppler ultrasound was utilized to evaluate blood flow to the ovaries.  COMPARISON: None.  FINDINGS: Uterus  Measurements: 8 x 3.6 x 4.4 cm. No fibroids or other mass visualized.  Endometrium  Thickness: 3.8 mm. No focal abnormality visualized.  Right ovary  Measurements: 5.4 x 2.9 x 4.2 cm. 3.9 x 2.6 x 3.5 cm avascular hypoechoic right ovarian mass with thin internal septations likely representing a hemorrhagic cyst.  Left ovary  Measurements: 3.2 x 1.7 x 2.3 cm. Normal appearance/no adnexal mass.  Pulsed Doppler evaluation demonstrates normal low-resistance arterial and venous waveforms in both ovaries.  Small amount of pelvic free fluid.  IMPRESSION: 1. No ovarian torsion. 2. 3.9 x 2.6 x 3.5 cm hypoechoic right ovarian mass with thin internal septations likely representing a hemorrhagic cyst.   Electronically Signed By: Elige KoHetal Patel On: 04/25/2016 07:47    Discussed with the patient, comfortable being discharged with careful return precautions and follow-up with her doctor and gynecologist. Discuss careful return precautions, as well as safe use of hydrocodone, no driving today, or while using this medication.  Patient and husband both very agreeable.  Return precautions and treatment recommendations and follow-up discussed with the patient who is agreeable with the plan.    Sharyn CreamerMark Fiorella Hanahan, MD 04/25/16 [id number]

## 2016-04-27 LAB — URINE CULTURE: SPECIAL REQUESTS: NORMAL

## 2016-05-06 ENCOUNTER — Telehealth: Payer: Self-pay

## 2016-05-06 NOTE — Telephone Encounter (Signed)
Pt request refill zoloft 100 mg to [facility]. Advised pt should have med thru mid June. Pt will ck with pharmacy and cb if needed.

## 2016-05-07 ENCOUNTER — Ambulatory Visit: Payer: 59 | Admitting: Primary Care

## 2016-05-14 ENCOUNTER — Ambulatory Visit (INDEPENDENT_AMBULATORY_CARE_PROVIDER_SITE_OTHER): Payer: 59 | Admitting: Primary Care

## 2016-05-14 ENCOUNTER — Encounter: Payer: Self-pay | Admitting: Primary Care

## 2016-05-14 VITALS — BP 110/68 | HR 103 | Temp 98.1°F | Ht 68.0 in | Wt 257.8 lb

## 2016-05-14 DIAGNOSIS — F418 Other specified anxiety disorders: Secondary | ICD-10-CM

## 2016-05-14 DIAGNOSIS — F419 Anxiety disorder, unspecified: Principal | ICD-10-CM

## 2016-05-14 DIAGNOSIS — F32A Depression, unspecified: Secondary | ICD-10-CM

## 2016-05-14 DIAGNOSIS — F329 Major depressive disorder, single episode, unspecified: Secondary | ICD-10-CM

## 2016-05-14 MED ORDER — SERTRALINE HCL 100 MG PO TABS: 100.0000 mg | ORAL_TABLET | Freq: Every day | ORAL | Status: DC

## 2016-05-14 NOTE — Assessment & Plan Note (Signed)
Improved with increase in Zoloft to 150 mg. Using alprazolam 1/2 tablet daily. Discussed importance to use PRN and goal will still be to wean off. Next refill will be for 0.25 mg PRN once daily. She verbalized understanding. Denies SI/HI. Follow up in 6 months.

## 2016-05-14 NOTE — Progress Notes (Signed)
   Subjective:    Patient ID: Terry Zhang, female    DOB: [birth year], 25 y.o.   MRN: [id number]  HPI  [name] is a 25 year old female who presents today for follow up of anxiety and depression. She is currently managed on Zoloft 150 mg and alprazolam 0.5 mg PRN. The goal is to wean her off of the alprazolam gradually.   Since her last visit she's feeling improved. She believes the increase in Zoloft to 150 mg has been beneficial. She's been working to reduce her alprazolam as she's been taking 1/2 tablet once daily, sometimes requiring 1 full tablet. She's also sleeping better as she's started taking Melatonin. Denies SI/HI, headaches, GI upset.  Review of Systems  Respiratory: Negative for shortness of breath.   Cardiovascular: Negative for chest pain.  Neurological: Negative for headaches.  Psychiatric/Behavioral: Negative for suicidal ideas and sleep disturbance. The patient is not nervous/anxious.        Past Medical History  Diagnosis Date  . Depression   . Hyperlipidemia      Social History   Social History  . Marital Status: Single    Spouse Name: N/A  . Number of Children: N/A  . Years of Education: N/A   Occupational History  . Not on file.   Social History Main Topics  . Smoking status: Never Smoker   . Smokeless tobacco: Not on file  . Alcohol Use: No  . Drug Use: Not on file  . Sexual Activity: Not on file   Other Topics Concern  . Not on file   Social History Narrative   Married.   1 daughter.   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   Once enjoyed playing sports, going to [employer] [place], swimming.    Past Surgical History  Procedure Laterality Date  . Breast biopsy  2003  . Appendectomy  2008  . Ankle surgery      Family History  Problem Relation Age of Onset  . Arthritis Mother   . Cancer Mother     ovary  . Hyperlipidemia Mother   . Mental illness Mother   . Diabetes Mother   . Hyperlipidemia Father   . Hypertension Father    . Mental illness Father   . Mental illness Maternal Grandfather   . Diabetes Maternal Grandfather     No Known Allergies  Current Outpatient Prescriptions on File Prior to Visit  Medication Sig Dispense Refill  . ALPRAZolam (XANAX) 0.5 MG tablet Take 1 tablet (0.5 mg total) by mouth daily as needed for anxiety. 90 tablet 0  . ibuprofen (ADVIL,MOTRIN) 200 MG tablet Take 200 mg by mouth.    . norgestimate-ethinyl estradiol (PREVIFEM) 0.25-35 MG-MCG tablet daily.    . sertraline (ZOLOFT) 50 MG tablet Take 1 tablet by mouth  daily 90 tablet 1   No current facility-administered medications on file prior to visit.    BP 110/68 mmHg  Pulse 103  Temp(Src) 98.1 F (36.7 C) (Oral)  Ht 5\' 8"  (1.727 m)  Wt 257 lb 12.8 oz (116.937 kg)  BMI 39.21 kg/m2  SpO2 97%  LMP 05/01/2016    Objective:   Physical Exam  Constitutional: She appears well-nourished.  Cardiovascular: Normal rate and regular rhythm.   Pulmonary/Chest: Effort normal and breath sounds normal.  Skin: Skin is warm and dry.  Psychiatric: She has a normal mood and affect.          Assessment & Plan:

## 2016-05-14 NOTE — Progress Notes (Signed)
Pre visit review using our clinic review tool, if applicable. No additional management support is needed unless otherwise documented below in the visit note. 

## 2016-05-14 NOTE — Patient Instructions (Signed)
I've sent in refills of Zoloft 100 mg to [facility]. Please e-mail me if you have an problems with future refills.  Continue to wean down on your alprazolam as discussed. Take this only as needed for anxiety to prevent addiction.  Follow up in 6 months for re-evaluation.  It was a pleasure to see you today!

## 2016-06-17 ENCOUNTER — Other Ambulatory Visit: Payer: Self-pay | Admitting: Primary Care

## 2016-06-17 DIAGNOSIS — F419 Anxiety disorder, unspecified: Principal | ICD-10-CM

## 2016-06-17 DIAGNOSIS — F32A Depression, unspecified: Secondary | ICD-10-CM

## 2016-06-17 DIAGNOSIS — F329 Major depressive disorder, single episode, unspecified: Secondary | ICD-10-CM

## 2016-06-17 NOTE — Telephone Encounter (Signed)
Electronically refill request for   ALPRAZolam (XANAX) 0.5 MG tablet   Take 1 tablet (0.5 mg total) by mouth daily as needed for anxiety.  Dispense: 90 tablet   Refills: 0     Last prescribed on 03/07/2016. Last seen on 05/14/2016. Follow up on 11/14/2016.

## 2016-06-18 ENCOUNTER — Telehealth: Payer: Self-pay | Admitting: Primary Care

## 2016-06-18 MED ORDER — ALPRAZOLAM 0.25 MG PO TABS: 0.2500 mg | ORAL_TABLET | Freq: Every day | ORAL | Status: DC | PRN

## 2016-06-18 NOTE — Telephone Encounter (Signed)
Please call in alprazolam 0.25 mg tablets to [facility]. Take 1 tablet by mouth once daily only as needed for breakthrough anxiety. Quantity 90, 0 refills. Please remind patient this is not to be used daily as discussed in May.

## 2016-06-18 NOTE — Telephone Encounter (Signed)
Called in [facility]anax to [facility] 

## 2016-06-19 NOTE — Telephone Encounter (Signed)
Pt called to ck on status of xanax refill; advised called in on 06/18/16. Pt will ck with pharmacy.

## 2016-06-19 NOTE — Telephone Encounter (Signed)
Per DPR, left detail message for patient of Kate's comments. 

## 2016-09-13 ENCOUNTER — Other Ambulatory Visit: Payer: Self-pay | Admitting: Primary Care

## 2016-09-13 DIAGNOSIS — F329 Major depressive disorder, single episode, unspecified: Secondary | ICD-10-CM

## 2016-09-13 DIAGNOSIS — F419 Anxiety disorder, unspecified: Principal | ICD-10-CM

## 2016-09-13 NOTE — Telephone Encounter (Signed)
Ok to refill? Electronically refill request for   sertraline (ZOLOFT) 100 MG tablet  Last prescribed and seen on 05/14/2016.

## 2016-11-14 ENCOUNTER — Telehealth: Payer: Self-pay | Admitting: Primary Care

## 2016-11-14 ENCOUNTER — Encounter: Payer: Self-pay | Admitting: Primary Care

## 2016-11-14 ENCOUNTER — Ambulatory Visit: Payer: 59 | Admitting: Primary Care

## 2016-11-14 NOTE — Telephone Encounter (Signed)
I'll have Johny Drillinghan call and check on patient. Johny Drillinghan, please check on patient, how's her anxiety? Is she still using Xanax? If so, then how often?

## 2016-11-14 NOTE — Telephone Encounter (Signed)
Spoken to patient and she stated that [place] recently [place]. She is going [place] new [place] in [place] now and she forgot about this appointment.

## 2016-11-14 NOTE — Telephone Encounter (Signed)
Patient did not come in for their appointment todayfor 6 month follow up  Please let me know if patient needs to be contacted immediately for follow up or no follow up needed. °

## 2016-11-14 NOTE — Telephone Encounter (Signed)
Noted. Please take her off of the schedule.

## 2017-01-10 ENCOUNTER — Other Ambulatory Visit: Payer: Self-pay | Admitting: Primary Care

## 2017-01-10 DIAGNOSIS — F419 Anxiety disorder, unspecified: Principal | ICD-10-CM

## 2017-01-10 DIAGNOSIS — F329 Major depressive disorder, single episode, unspecified: Secondary | ICD-10-CM

## 2017-01-10 DIAGNOSIS — F32A Depression, unspecified: Secondary | ICD-10-CM

## 2017-03-05 ENCOUNTER — Encounter: Payer: Self-pay | Admitting: Obstetrics and Gynecology

## 2017-03-05 ENCOUNTER — Ambulatory Visit (INDEPENDENT_AMBULATORY_CARE_PROVIDER_SITE_OTHER): Payer: 59 | Admitting: Obstetrics and Gynecology

## 2017-03-05 VITALS — BP 126/90 | HR 78 | Ht 68.0 in | Wt 264.0 lb

## 2017-03-05 DIAGNOSIS — Z3041 Encounter for surveillance of contraceptive pills: Secondary | ICD-10-CM

## 2017-03-05 DIAGNOSIS — Z124 Encounter for screening for malignant neoplasm of cervix: Secondary | ICD-10-CM | POA: Diagnosis not present

## 2017-03-05 DIAGNOSIS — N898 Other specified noninflammatory disorders of vagina: Secondary | ICD-10-CM | POA: Diagnosis not present

## 2017-03-05 DIAGNOSIS — Z01419 Encounter for gynecological examination (general) (routine) without abnormal findings: Secondary | ICD-10-CM

## 2017-03-05 MED ORDER — NORGESTIMATE-ETH ESTRADIOL 0.25-35 MG-MCG PO TABS: 1.0000 | ORAL_TABLET | Freq: Every day | ORAL | 12 refills | Status: DC

## 2017-03-05 NOTE — Progress Notes (Addendum)
HPI:      [name] is a 26 y.o. No obstetric history on file. who LMP was Patient's last menstrual period was 02/23/2017., presents today for her annual examination.  Her menses are regular every 28-30 days, lasting 5 day(s). Dysmenorrhea mild, she takes NSAIDs with relief.   She is sexually active. She uses OCPs. Last Pap: 02/23/16 Results were: no abnormalities   Hx of STDs: none She complains of vaginal pain after sex about 5 days ago. They used warming lubricant that has never caused problems before. Pt complains of vaginal burning now, even hurts to sit. No increased d/c or odor.  There is no FH of breast or ovarian cancer. The patient does/ not do self-breast exams.  Tobacco use: The patient currently smokes 1/4  packs of cigarettes per day for the past few years. Alcohol use: none Exercise: moderately active  She does get adequate calcium and Vitamin D in her diet.  She had elevated lipids last yr. She is being followed by PCP. Lipids improved recently and PCP is monitoring.  Active Ambulatory Problems    Diagnosis Date Noted  . Anxiety and depression 08/03/2015  . Hyperlipidemia 02/23/2016   Resolved Ambulatory Problems    Diagnosis Date Noted  . No Resolved Ambulatory Problems   Past Medical History:  Diagnosis Date  . Depression   . Hyperlipidemia 02/23/2016      Past Surgical History:  Procedure Laterality Date  . ANKLE SURGERY    . APPENDECTOMY  2008  . BREAST BIOPSY  2003    Family History  Problem Relation Age of Onset  . Arthritis Mother   . Cancer Mother [id number]    cervical  . Hyperlipidemia Mother   . Mental illness Mother   . Diabetes Mother   . Hyperlipidemia Father   . Hypertension Father   . Mental illness Father   . Mental illness Maternal Grandfather   . Diabetes Maternal Grandfather   . Pancreatic cancer Maternal Grandmother 73     ROS:  Review of Systems  Constitutional: Negative for fever, malaise/fatigue and weight  loss.  HENT: Negative for congestion, ear pain and sinus pain.   Respiratory: Negative for cough, shortness of breath and wheezing.   Cardiovascular: Negative for chest pain, orthopnea and leg swelling.  Gastrointestinal: Negative for constipation, diarrhea, nausea and vomiting.  Genitourinary: Negative for dysuria, frequency, hematuria and urgency.       Breast ROS: negative   Musculoskeletal: Negative for back pain, joint pain and myalgias.  Skin: Negative for itching and rash.  Neurological: Negative for dizziness, tingling, focal weakness and headaches.  Endo/Heme/Allergies: Negative for environmental allergies. Does not bruise/bleed easily.  Psychiatric/Behavioral: Negative for depression and suicidal ideas. The patient is not nervous/anxious and does not have insomnia.     Objective: BP 126/90 (Patient Position: Sitting)   Pulse 78   Ht 5\' 8"  (1.727 m)   Wt 264 lb (119.7 kg)   LMP 02/23/2017   BMI 40.14 kg/m    Physical Exam  Constitutional: She is oriented to person, place, and time. She appears well-developed and well-nourished.  Genitourinary: Vagina normal and uterus normal.  There is tenderness and lesion on the right labia. No erythema or tenderness in the vagina. No vaginal discharge found. Right adnexum does not display mass and does not display tenderness. Left adnexum does not display mass and does not display tenderness. Cervix does not exhibit motion tenderness or polyp. Uterus is not enlarged or tender.  Neck: Normal range of motion. No thyromegaly present.  Cardiovascular: Normal rate, regular rhythm and normal heart sounds.   No murmur heard. Pulmonary/Chest: Effort normal and breath sounds normal. Right breast exhibits no mass, no nipple discharge, no skin change and no tenderness. Left breast exhibits no mass, no nipple discharge, no skin change and no tenderness.  Abdominal: Soft. There is no tenderness. There is no guarding.  Musculoskeletal: Normal range of  motion.  Neurological: She is alert and oriented to person, place, and time. No cranial nerve deficit.  Psychiatric: She has a normal mood and affect. Her behavior is normal.  Vitals reviewed.    Assessment:  Encounter for annual routine gynecological examination - Plan: IGP, rfx Aptima HPV ASCU  Encounter for surveillance of contraceptive pills - OCP RF.   Screening for cervical cancer - Plan: IGP, rfx Aptima HPV ASCU  Vaginal lesion - Question HSV. One Swab culture done. Will call pt with results. Discussed HSV etiology, sx, partner testing. Too late to start valtrex. Sitz baths/NSAIDs prn.  - Plan: Other/Misc lab test     Plan:            GYN counsel use and side effects of OCP's, adequate intake of calcium and vitamin D, tobacco cessation.     F/U  Return in about 1 year (around 03/05/2018).  Terry Feldmeier B. Pauline Trainer, PA-C 03/05/2017 10:51 AM

## 2017-03-06 ENCOUNTER — Encounter: Payer: Self-pay | Admitting: Obstetrics and Gynecology

## 2017-03-07 LAB — IGP, RFX APTIMA HPV ASCU: PAP Smear Comment: 0

## 2017-03-13 ENCOUNTER — Other Ambulatory Visit: Payer: Self-pay | Admitting: Obstetrics and Gynecology

## 2017-03-13 DIAGNOSIS — N898 Other specified noninflammatory disorders of vagina: Secondary | ICD-10-CM

## 2017-03-13 NOTE — Progress Notes (Signed)
Pt aware of neg HSV culture results. Will check HSV 2 IgG to verify vag lesions were not type 2 herpes. Pt's sx have also since resolved.

## 2018-05-04 ENCOUNTER — Encounter: Payer: Self-pay | Admitting: Obstetrics & Gynecology

## 2018-05-06 ENCOUNTER — Encounter: Payer: Self-pay | Admitting: Obstetrics & Gynecology

## 2018-05-06 ENCOUNTER — Ambulatory Visit (INDEPENDENT_AMBULATORY_CARE_PROVIDER_SITE_OTHER): Payer: Managed Care, Other (non HMO) | Admitting: Obstetrics & Gynecology

## 2018-05-06 VITALS — BP 138/80 | Wt 278.0 lb

## 2018-05-06 DIAGNOSIS — Z3491 Encounter for supervision of normal pregnancy, unspecified, first trimester: Secondary | ICD-10-CM

## 2018-05-06 DIAGNOSIS — O0990 Supervision of high risk pregnancy, unspecified, unspecified trimester: Secondary | ICD-10-CM

## 2018-05-06 DIAGNOSIS — Z3A01 Less than 8 weeks gestation of pregnancy: Secondary | ICD-10-CM

## 2018-05-06 HISTORY — DX: Supervision of high risk pregnancy, unspecified, unspecified trimester: O09.90

## 2018-05-06 NOTE — Progress Notes (Signed)
05/06/2018   Chief Complaint: Missed period  Transfer of Care Patient: no  History of Present Illness: Terry Zhang is a 27 y.o. G2P0101 [redacted]w[redacted]d based on Patient's last menstrual period was 03/14/2018 (exact date). with an Estimated Date of Delivery: 12/19/18, with the above CC.   Her periods were: regular periods every 28 days She was using no method when she conceived.  She has Positive signs or symptoms of nausea/vomiting of pregnancy. She has Negative signs or symptoms of miscarriage or preterm labor She identifies Negative Zika risk factors for her and her partner On any different medications around the time she conceived/early pregnancy: Yes - Cymbalta and Xanax (now tapered off) History of varicella: Yes   ROS: A 12-point review of systems was performed and negative, except as stated in the above HPI.  OBGYN History: As per HPI. OB History  Gravida Para Term Preterm AB Living  2 1 0 1   1  SAB TAB Ectopic Multiple Live Births          1    # Outcome Date GA Lbr Len/2nd Weight Sex Delivery Anes PTL Lv  2 Current           1 Preterm 06/15/09 [redacted]w[redacted]d  6 lb 5 oz (2.863 kg) F Vag-Vacuum  Y LIV    Any issues with any prior pregnancies: PTD 36 4/7 weeks, active and stressful pregnancy 9 years ago Any prior children are healthy, doing well, without any problems or issues: yes History of pap smears: Yes. Last pap smear ubk. Abnormal: no  History of STIs: No   Past Medical History: Past Medical History:  Diagnosis Date  . Anxiety   . Depression   . Hyperlipidemia 02/23/2016  . Obesity     Past Surgical History: Past Surgical History:  Procedure Laterality Date  . ANKLE SURGERY    . APPENDECTOMY  2008  . BREAST BIOPSY  2003  . TONSILLECTOMY      Family History:  Family History  Problem Relation Age of Onset  . Arthritis Mother   . Cancer Mother 39       cervical  . Hyperlipidemia Mother   . Mental illness Mother   . Diabetes Mother   . Hyperlipidemia Father   .  Hypertension Father   . Mental illness Father   . Mental illness Maternal Grandfather   . Diabetes Maternal Grandfather   . Pancreatic cancer Maternal Grandmother 20   She denies any female cancers, bleeding or blood clotting disorders.  She denies any history of mental retardation, birth defects or genetic disorders in her or the FOB's history  Social History:  Social History   Socioeconomic History  . Marital status: Married    Spouse name: Not on file  . Number of children: Not on file  . Years of education: Not on file  . Highest education level: Not on file  Occupational History  . Not on file  Social Needs  . Financial resource strain: Not on file  . Food insecurity:    Worry: Not on file    Inability: Not on file  . Transportation needs:    Medical: Not on file    Non-medical: Not on file  Tobacco Use  . Smoking status: Current Every Day Smoker    Packs/day: 0.25    Types: Cigarettes  . Smokeless tobacco: Never Used  Substance and Sexual Activity  . Alcohol use: No    Alcohol/week: 0.0 oz  . Drug use: No  .  Sexual activity: Not on file  Lifestyle  . Physical activity:    Days per week: Not on file    Minutes per session: Not on file  . Stress: Not on file  Relationships  . Social connections:    Talks on phone: Not on file    Gets together: Not on file    Attends religious service: Not on file    Active member of club or organization: Not on file    Attends meetings of clubs or organizations: Not on file    Relationship status: Not on file  . Intimate partner violence:    Fear of current or ex partner: Not on file    Emotionally abused: Not on file    Physically abused: Not on file    Forced sexual activity: Not on file  Other Topics Concern  . Not on file  Social History Narrative   Married.   1 daughter.   Works as a Writer at [employer].   Once enjoyed playing sports, going to [employer], swimming.   Any pets in the household:  no  Allergy: No Known Allergies  Current Outpatient Medications:  Current Outpatient Medications:  .  ALPRAZolam (XANAX) 0.25 MG tablet, Take 1 tablet (0.25 mg total) by mouth daily as needed for anxiety. (Patient not taking: Reported on 05/06/2018), Disp: 90 tablet, Rfl: 0 .  DULoxetine (CYMBALTA) 20 MG capsule, Take 20 mg by mouth 2 (two) times daily., Disp: , Rfl:  .  ibuprofen (ADVIL,MOTRIN) 200 MG tablet, Take 200 mg by mouth., Disp: , Rfl:    Physical Exam:   BP 138/80   Wt 278 lb (126.1 kg)   LMP 03/14/2018 (Exact Date)   BMI 42.27 kg/m  Body mass index is 42.27 kg/m. Constitutional: Well nourished, well developed female in no acute distress.  Neck:  Supple, normal appearance, and no thyromegaly  Cardiovascular: S1, S2 normal, no murmur, rub or gallop, regular rate and rhythm Respiratory:  Clear to auscultation bilateral. Normal respiratory effort Abdomen: positive bowel sounds and no masses, hernias; diffusely non tender to palpation, non distended Breasts: breasts appear normal, no suspicious masses, no skin or nipple changes or axillary nodes. Neuro/Psych:  Normal mood and affect.  Skin:  Warm and dry.  Lymphatic:  No inguinal lymphadenopathy.   Pelvic exam: is not limited by body habitus EGBUS: within normal limits, Vagina: within normal limits and with no blood in the vault, Cervix: normal appearing cervix without discharge or lesions, closed/long/high, Uterus:  enlarged: 6 weeks, and Adnexa:  normal adnexa  Assessment: Terry Zhang is a 27 y.o. G2P0101 [redacted]w[redacted]d based on Patient's last menstrual period was 03/14/2018 (exact date). with an Estimated Date of Delivery: 12/19/18,  for prenatal care.  Plan:  1) Avoid alcoholic beverages. 2) Patient encouraged not to smoke.  3) Discontinue the use of all non-medicinal drugs and chemicals.  4) Take prenatal vitamins daily.  5) Seatbelt use advised 6) Nutrition, food safety (fish, cheese advisories, and high nitrite foods)  and exercise discussed. 7) Hospital and practice style delivering at [facility] discussed  8) Patient is asked about travel to areas at risk for the Zika virus, and counseled to avoid travel and exposure to mosquitoes or sexual partners who may have themselves been exposed to the virus. Testing is discussed, and will be ordered as appropriate.  9) Childbirth classes at [facility] advised 10) Genetic Screening, such as with 1st Trimester Screening, cell free fetal DNA, AFP testing, and Ultrasound, as well as  with amniocentesis and CVS as appropriate, is discussed with patient. She plans to have genetic testing this pregnancy. 11) Depression discussed. No meds now. Consider Zoloft if needed or at 36 weeks for preventioon of PPD 12) Obesity RF discussed, baby ASA, early Glucola 12 weeks 13) [place] soon  Problem list reviewed and updated.  Annamarie Major, MD, [facility], [facility] 05/06/2018  2:30 PM

## 2018-05-06 NOTE — Patient Instructions (Signed)
First Trimester of Pregnancy The first trimester of pregnancy is from week 1 until the end of week 13 (months 1 through 3). A week after a sperm fertilizes an egg, the egg will implant on the wall of the uterus. This embryo will begin to develop into a baby. Genes from you and your partner will form the baby. The female genes will determine whether the baby will be a boy or a girl. At 6-8 weeks, the eyes and face will be formed, and the heartbeat can be seen on ultrasound. At the end of 12 weeks, all the baby's organs will be formed. Now that you are pregnant, you will want to do everything you can to have a healthy baby. Two of the most important things are to get good prenatal care and to follow your health care provider's instructions. Prenatal care is all the medical care you receive before the baby's birth. This care will help prevent, find, and treat any problems during the pregnancy and childbirth. Body changes during your first trimester Your body goes through many changes during pregnancy. The changes vary from woman to woman.  You may gain or lose a couple of pounds at first.  You may feel sick to your stomach (nauseous) and you may throw up (vomit). If the vomiting is uncontrollable, call your health care provider.  You may tire easily.  You may develop headaches that can be relieved by medicines. All medicines should be approved by your health care provider.  You may urinate more often. Painful urination may mean you have a bladder infection.  You may develop heartburn as a result of your pregnancy.  You may develop constipation because certain hormones are causing the muscles that push stool through your intestines to slow down.  You may develop hemorrhoids or swollen veins (varicose veins).  Your breasts may begin to grow larger and become tender. Your nipples may stick out more, and the tissue that surrounds them (areola) may become darker.  Your gums may bleed and may be  sensitive to brushing and flossing.  Dark spots or blotches (chloasma, mask of pregnancy) may develop on your face. This will likely fade after the baby is born.  Your menstrual periods will stop.  You may have a loss of appetite.  You may develop cravings for certain kinds of food.  You may have changes in your emotions from day to day, such as being excited to be pregnant or being concerned that something may go wrong with the pregnancy and baby.  You may have more vivid and strange dreams.  You may have changes in your hair. These can include thickening of your hair, rapid growth, and changes in texture. Some women also have hair loss during or after pregnancy, or hair that feels dry or thin. Your hair will most likely return to normal after your baby is born.  What to expect at prenatal visits During a routine prenatal visit:  You will be weighed to make sure you and the baby are growing normally.  Your blood pressure will be taken.  Your abdomen will be measured to track your baby's growth.  The fetal heartbeat will be listened to between weeks 10 and 14 of your pregnancy.  Test results from any previous visits will be discussed.  Your health care provider may ask you:  How you are feeling.  If you are feeling the baby move.  If you have had any abnormal symptoms, such as leaking fluid, bleeding, severe headaches,   or abdominal cramping.  If you are using any tobacco products, including cigarettes, chewing tobacco, and electronic cigarettes.  If you have any questions.  Other tests that may be performed during your first trimester include:  Blood tests to find your blood type and to check for the presence of any previous infections. The tests will also be used to check for low iron levels (anemia) and protein on red blood cells (Rh antibodies). Depending on your risk factors, or if you previously had diabetes during pregnancy, you may have tests to check for high blood  sugar that affects pregnant women (gestational diabetes).  Urine tests to check for infections, diabetes, or protein in the urine.  An ultrasound to confirm the proper growth and development of the baby.  Fetal screens for spinal cord problems (spina bifida) and Down syndrome.  HIV (human immunodeficiency virus) testing. Routine prenatal testing includes screening for HIV, unless you choose not to have this test.  You may need other tests to make sure you and the baby are doing well.  Follow these instructions at home: Medicines  Follow your health care provider's instructions regarding medicine use. Specific medicines may be either safe or unsafe to take during pregnancy.  Take a prenatal vitamin that contains at least 600 micrograms (mcg) of folic acid.  If you develop constipation, try taking a stool softener if your health care provider approves. Eating and drinking  Eat a balanced diet that includes fresh fruits and vegetables, whole grains, good sources of protein such as meat, eggs, or tofu, and low-fat dairy. Your health care provider will help you determine the amount of weight gain that is right for you.  Avoid raw meat and uncooked cheese. These carry germs that can cause birth defects in the baby.  Eating four or five small meals rather than three large meals a day may help relieve nausea and vomiting. If you start to feel nauseous, eating a few soda crackers can be helpful. Drinking liquids between meals, instead of during meals, also seems to help ease nausea and vomiting.  Limit foods that are high in fat and processed sugars, such as fried and sweet foods.  To prevent constipation: ? Eat foods that are high in fiber, such as fresh fruits and vegetables, whole grains, and beans. ? Drink enough fluid to keep your urine clear or pale yellow. Activity  Exercise only as directed by your health care provider. Most women can continue their usual exercise routine during  pregnancy. Try to exercise for 30 minutes at least 5 days a week. Exercising will help you: ? Control your weight. ? Stay in shape. ? Be prepared for labor and delivery.  Experiencing pain or cramping in the lower abdomen or lower back is a good sign that you should stop exercising. Check with your health care provider before continuing with normal exercises.  Try to avoid standing for long periods of time. Move your legs often if you must stand in one place for a long time.  Avoid heavy lifting.  Wear low-heeled shoes and practice good posture.  You may continue to have sex unless your health care provider tells you not to. Relieving pain and discomfort  Wear a good support bra to relieve breast tenderness.  Take warm sitz baths to soothe any pain or discomfort caused by hemorrhoids. Use hemorrhoid cream if your health care provider approves.  Rest with your legs elevated if you have leg cramps or low back pain.  If you develop   varicose veins in your legs, wear support hose. Elevate your feet for 15 minutes, 3-4 times a day. Limit salt in your diet. Prenatal care  Schedule your prenatal visits by the twelfth week of pregnancy. They are usually scheduled monthly at first, then more often in the last 2 months before delivery.  Write down your questions. Take them to your prenatal visits.  Keep all your prenatal visits as told by your health care provider. This is important. Safety  Wear your seat belt at all times when driving.  Make a list of emergency phone numbers, including numbers for family, friends, the hospital, and police and fire departments. General instructions  Ask your health care provider for a referral to a local prenatal education class. Begin classes no later than the beginning of month 6 of your pregnancy.  Ask for help if you have counseling or nutritional needs during pregnancy. Your health care provider can offer advice or refer you to specialists for help  with various needs.  Do not use hot tubs, steam rooms, or saunas.  Do not douche or use tampons or scented sanitary pads.  Do not cross your legs for long periods of time.  Avoid cat litter boxes and soil used by cats. These carry germs that can cause birth defects in the baby and possibly loss of the fetus by miscarriage or stillbirth.  Avoid all smoking, herbs, alcohol, and medicines not prescribed by your health care provider. Chemicals in these products affect the formation and growth of the baby.  Do not use any products that contain nicotine or tobacco, such as cigarettes and e-cigarettes. If you need help quitting, ask your health care provider. You may receive counseling support and other resources to help you quit.  Schedule a dentist appointment. At home, brush your teeth with a soft toothbrush and be gentle when you floss. Contact a health care provider if:  You have dizziness.  You have mild pelvic cramps, pelvic pressure, or nagging pain in the abdominal area.  You have persistent nausea, vomiting, or diarrhea.  You have a bad smelling vaginal discharge.  You have pain when you urinate.  You notice increased swelling in your face, hands, legs, or ankles.  You are exposed to fifth disease or chickenpox.  You are exposed to German measles (rubella) and have never had it. Get help right away if:  You have a fever.  You are leaking fluid from your vagina.  You have spotting or bleeding from your vagina.  You have severe abdominal cramping or pain.  You have rapid weight gain or loss.  You vomit blood or material that looks like coffee grounds.  You develop a severe headache.  You have shortness of breath.  You have any kind of trauma, such as from a fall or a car accident. Summary  The first trimester of pregnancy is from week 1 until the end of week 13 (months 1 through 3).  Your body goes through many changes during pregnancy. The changes vary from  woman to woman.  You will have routine prenatal visits. During those visits, your health care provider will examine you, discuss any test results you may have, and talk with you about how you are feeling. This information is not intended to replace advice given to you by your health care provider. Make sure you discuss any questions you have with your health care provider. Document Released: 12/10/2001 Document Revised: 11/27/2016 Document Reviewed: 11/27/2016 Elsevier Interactive Patient Education  2018 Elsevier   Inc.  

## 2018-05-07 LAB — RPR+RH+ABO+RUB AB+AB SCR+CB...
Antibody Screen: NEGATIVE
HIV SCREEN 4TH GENERATION: NONREACTIVE
Hematocrit: 39.4 % (ref 34.0–46.6)
Hemoglobin: 13.4 g/dL (ref 11.1–15.9)
Hepatitis B Surface Ag: NEGATIVE
MCH: 28.9 pg (ref 26.6–33.0)
MCHC: 34 g/dL (ref 31.5–35.7)
MCV: 85 fL (ref 79–97)
PLATELETS: 273 10*3/uL (ref 150–379)
RBC: 4.63 x10E6/uL (ref 3.77–5.28)
RDW: 13.8 % (ref 12.3–15.4)
RPR: NONREACTIVE
RUBELLA: 1.36 {index} (ref >0.99)
Rh Factor: POSITIVE
Varicella zoster IgG: 657 index (ref >165)
WBC: 9.6 10*3/uL (ref 3.4–10.8)

## 2018-05-08 LAB — URINE CULTURE: Organism ID, Bacteria: NO GROWTH

## 2018-05-09 LAB — IGP,CTNGTV,RFX APTIMA HPV ASCU
Chlamydia, Nuc. Acid Amp: NEGATIVE
GONOCOCCUS, NUC. ACID AMP: NEGATIVE
PAP Smear Comment: 0
TRICH VAG BY NAA: NEGATIVE

## 2018-05-12 ENCOUNTER — Encounter: Payer: Self-pay | Admitting: Certified Nurse Midwife

## 2018-05-12 ENCOUNTER — Ambulatory Visit (INDEPENDENT_AMBULATORY_CARE_PROVIDER_SITE_OTHER): Payer: Managed Care, Other (non HMO)

## 2018-05-12 ENCOUNTER — Ambulatory Visit (INDEPENDENT_AMBULATORY_CARE_PROVIDER_SITE_OTHER): Payer: Managed Care, Other (non HMO) | Admitting: Certified Nurse Midwife

## 2018-05-12 VITALS — BP 118/68 | Wt 281.0 lb

## 2018-05-12 DIAGNOSIS — O09211 Supervision of pregnancy with history of pre-term labor, first trimester: Secondary | ICD-10-CM | POA: Diagnosis not present

## 2018-05-12 DIAGNOSIS — Z3A01 Less than 8 weeks gestation of pregnancy: Secondary | ICD-10-CM | POA: Diagnosis not present

## 2018-05-12 DIAGNOSIS — O0991 Supervision of high risk pregnancy, unspecified, first trimester: Secondary | ICD-10-CM

## 2018-05-12 DIAGNOSIS — Z3491 Encounter for supervision of normal pregnancy, unspecified, first trimester: Secondary | ICD-10-CM

## 2018-05-12 DIAGNOSIS — Z6841 Body Mass Index (BMI) 40.0 and over, adult: Secondary | ICD-10-CM

## 2018-05-12 DIAGNOSIS — O99210 Obesity complicating pregnancy, unspecified trimester: Secondary | ICD-10-CM

## 2018-05-12 DIAGNOSIS — O99211 Obesity complicating pregnancy, first trimester: Secondary | ICD-10-CM | POA: Diagnosis not present

## 2018-05-12 DIAGNOSIS — O0990 Supervision of high risk pregnancy, unspecified, unspecified trimester: Secondary | ICD-10-CM

## 2018-05-12 DIAGNOSIS — O09899 Supervision of other high risk pregnancies, unspecified trimester: Secondary | ICD-10-CM

## 2018-05-12 DIAGNOSIS — O09219 Supervision of pregnancy with history of pre-term labor, unspecified trimester: Secondary | ICD-10-CM

## 2018-05-12 HISTORY — DX: Obesity complicating pregnancy, unspecified trimester: O99.210

## 2018-05-12 NOTE — Progress Notes (Signed)
HROB and dating scan: Doing well. No VB SIUP with CRL c/w 6wk1d changing EDC to 01/04/2019, FCA 117BPM History of preterm labor and delivery at 36wk 4days-offered 17P to reduce risk of preterm birth-is considering, but leaning against Discussed genetic testing-desires Materniti 21-will get with 1 hour GTT (is [employer] employee) ROB in 4 weeks

## 2018-05-12 NOTE — Progress Notes (Signed)
Pt reports no problems. Dating scan today.  

## 2018-06-09 ENCOUNTER — Ambulatory Visit (INDEPENDENT_AMBULATORY_CARE_PROVIDER_SITE_OTHER): Payer: Managed Care, Other (non HMO) | Admitting: Obstetrics and Gynecology

## 2018-06-09 VITALS — BP 110/78 | Wt 287.0 lb

## 2018-06-09 DIAGNOSIS — Z3A10 10 weeks gestation of pregnancy: Secondary | ICD-10-CM

## 2018-06-09 DIAGNOSIS — Z131 Encounter for screening for diabetes mellitus: Secondary | ICD-10-CM

## 2018-06-09 DIAGNOSIS — O09219 Supervision of pregnancy with history of pre-term labor, unspecified trimester: Secondary | ICD-10-CM

## 2018-06-09 DIAGNOSIS — O0990 Supervision of high risk pregnancy, unspecified, unspecified trimester: Secondary | ICD-10-CM

## 2018-06-09 DIAGNOSIS — O09899 Supervision of other high risk pregnancies, unspecified trimester: Secondary | ICD-10-CM

## 2018-06-09 NOTE — Progress Notes (Signed)
ROB

## 2018-06-09 NOTE — Progress Notes (Signed)
    Routine Prenatal Care Visit  Subjective  Terry Zhang is a 27 y.o. G2P0101 at 4614w2d being seen today for ongoing prenatal care.  She is currently monitored for the following issues for this high-risk pregnancy and has Anxiety and depression; Hyperlipidemia; Supervision of high risk pregnancy, antepartum; Obesity in pregnancy; BMI 40.0-44.9, adult (HCC); History of preterm delivery, currently pregnant; and Screening for diabetes mellitus on their problem list.  ----------------------------------------------------------------------------------- Patient reports no complaints.   Contractions: Not present. Vag. Bleeding: None.  Movement: Absent. Denies leaking of fluid.  ----------------------------------------------------------------------------------- The following portions of the patient's history were reviewed and updated as appropriate: allergies, current medications, past family history, past medical history, past social history, past surgical history and problem list. Problem list updated.   Objective  Blood pressure 110/78, weight 287 lb (130.2 kg), last menstrual period 03/14/2018. Pregravid weight 272 lb (123.4 kg) Total Weight Gain 15 lb (6.804 kg) Urinalysis: Urine Protein: Negative Urine Glucose: Negative  Fetal Status: Fetal Heart Rate (bpm): 159   Movement: Absent    (FHT via US)  General:  Alert, oriented and cooperative. Patient is in no acute distress.  Skin: Skin is warm and dry. No rash noted.   Cardiovascular: Normal heart rate noted  Respiratory: Normal respiratory effort, no problems with respiration noted  Abdomen: Soft, gravid, appropriate for gestational age. Pain/Pressure: Absent     Pelvic:  Cervical exam deferred        Extremities: Normal range of motion.     ental Status: Normal mood and affect. Normal behavior. Normal judgment and thought content.     Assessment   27 y.o. G2P0101 at 4914w2d by  01/04/2019, by Ultrasound presenting for routine  prenatal visit  Plan   pregnancy 2  Problems (from 03/14/18 to present)    Problem Noted Resolved   History of preterm delivery, currently pregnant 05/12/2018 by Farrel ConnersGutierrez, Colleen, CNM No   Overview Signed 05/12/2018  3:38 PM by Farrel ConnersGutierrez, Colleen, CNM    Spontaneous labor and delivery of G1. Offered 217 P          Gestational age appropriate obstetric precautions including but not limited to vaginal bleeding, contractions, leaking of fluid and fetal movement were reviewed in detail with the patient.   - genetic testing at 12 weeks Maternity 21 AND Inheritest\  Return in about 2 weeks (around 06/23/2018) for ROB and early 1-hr.  Vena AustriaAndreas Derry Arbogast, MD, Evern CoreFACOG [facility], [facility] 06/10/2018, 1:29 PM

## 2018-06-10 DIAGNOSIS — Z131 Encounter for screening for diabetes mellitus: Secondary | ICD-10-CM | POA: Insufficient documentation

## 2018-06-11 ENCOUNTER — Telehealth: Payer: Self-pay

## 2018-06-11 NOTE — Telephone Encounter (Signed)
Pt is 442w4d & thinks she has aggravated her sciatic nerve. She is having sharp pain running from left buttocks down her leg. Pt inquiring what she can do/take since she is pregnant. (heat/meds etc) [phone]

## 2018-06-11 NOTE — Telephone Encounter (Signed)
Spoke w/pt. Advised not to use NSAIDS (Advil, Mortin, Ibuprofen etc) can use Tylenol for pain. Can use Ice/Heat as tolerated. Rest & back stretching exercises should help. Pt will call back if s&s worsen or do not improve with these suggestions.

## 2018-06-23 ENCOUNTER — Ambulatory Visit (INDEPENDENT_AMBULATORY_CARE_PROVIDER_SITE_OTHER): Payer: Managed Care, Other (non HMO) | Admitting: Obstetrics and Gynecology

## 2018-06-23 ENCOUNTER — Other Ambulatory Visit: Payer: Managed Care, Other (non HMO)

## 2018-06-23 VITALS — BP 124/84 | Wt 288.0 lb

## 2018-06-23 DIAGNOSIS — O99211 Obesity complicating pregnancy, first trimester: Secondary | ICD-10-CM

## 2018-06-23 DIAGNOSIS — Z31438 Encounter for other genetic testing of female for procreative management: Secondary | ICD-10-CM

## 2018-06-23 DIAGNOSIS — Z3A12 12 weeks gestation of pregnancy: Secondary | ICD-10-CM

## 2018-06-23 DIAGNOSIS — O09211 Supervision of pregnancy with history of pre-term labor, first trimester: Secondary | ICD-10-CM

## 2018-06-23 DIAGNOSIS — O99341 Other mental disorders complicating pregnancy, first trimester: Secondary | ICD-10-CM

## 2018-06-23 DIAGNOSIS — Z6841 Body Mass Index (BMI) 40.0 and over, adult: Secondary | ICD-10-CM

## 2018-06-23 DIAGNOSIS — O09219 Supervision of pregnancy with history of pre-term labor, unspecified trimester: Secondary | ICD-10-CM

## 2018-06-23 DIAGNOSIS — O09899 Supervision of other high risk pregnancies, unspecified trimester: Secondary | ICD-10-CM

## 2018-06-23 DIAGNOSIS — O99210 Obesity complicating pregnancy, unspecified trimester: Secondary | ICD-10-CM

## 2018-06-23 DIAGNOSIS — F418 Other specified anxiety disorders: Secondary | ICD-10-CM

## 2018-06-23 DIAGNOSIS — O0990 Supervision of high risk pregnancy, unspecified, unspecified trimester: Secondary | ICD-10-CM

## 2018-06-23 DIAGNOSIS — Z1379 Encounter for other screening for genetic and chromosomal anomalies: Secondary | ICD-10-CM

## 2018-06-23 DIAGNOSIS — Z131 Encounter for screening for diabetes mellitus: Secondary | ICD-10-CM

## 2018-06-23 NOTE — Progress Notes (Signed)
    Routine Prenatal Care Visit  Subjective  Terry Zhang is a 27 y.o. G2P0101 at 8756w1d being seen today for ongoing prenatal care.  She is currently monitored for the following issues for this low-risk pregnancy and has Anxiety and depression; Hyperlipidemia; Supervision of high risk pregnancy, antepartum; Obesity in pregnancy; BMI 40.0-44.9, adult (HCC); History of preterm delivery, currently pregnant; and Screening for diabetes mellitus on their problem list.  ----------------------------------------------------------------------------------- Patient reports no complaints.   Contractions: Not present. Vag. Bleeding: None.  Movement: Absent. Denies leaking of fluid.  ----------------------------------------------------------------------------------- The following portions of the patient's history were reviewed and updated as appropriate: allergies, current medications, past family history, past medical history, past social history, past surgical history and problem list. Problem list updated.   Objective  Blood pressure 124/84, weight 288 lb (130.6 kg), last menstrual period 03/14/2018. Pregravid weight 272 lb (123.4 kg) Total Weight Gain 16 lb (7.258 kg) Urinalysis: Urine Protein: Negative Urine Glucose: Negative  Fetal Status: Fetal Heart Rate (bpm): 150   Movement: Absent     General:  Alert, oriented and cooperative. Patient is in no acute distress.  Skin: Skin is warm and dry. No rash noted.   Cardiovascular: Normal heart rate noted  Respiratory: Normal respiratory effort, no problems with respiration noted  Abdomen: Soft, gravid, appropriate for gestational age. Pain/Pressure: Absent     Pelvic:  Cervical exam deferred        Extremities: Normal range of motion.     ental Status: Normal mood and affect. Normal behavior. Normal judgment and thought content.     Assessment   27 y.o. G2P0101 at 6356w1d by  01/04/2019, by Ultrasound presenting for routine prenatal  visit  Plan   pregnancy 2  Problems (from 03/14/18 to present)    Problem Noted Resolved   History of preterm delivery, currently pregnant 05/12/2018 by Farrel ConnersGutierrez, Colleen, CNM No   Overview Signed 05/12/2018  3:38 PM by Farrel ConnersGutierrez, Colleen, CNM    Spontaneous labor and delivery of G1. Offered 4317 P          Gestational age appropriate obstetric precautions including but not limited to vaginal bleeding, contractions, leaking of fluid and fetal movement were reviewed in detail with the patient.    Return in about 1 month (around 07/21/2018) for ROB and cervical length ultrasound.  Vena AustriaAndreas Djuan Talton, MD, Evern CoreFACOG [facility], [facility] 06/23/2018, 10:35 AM

## 2018-06-23 NOTE — Progress Notes (Signed)
ROB °Early GTT today °

## 2018-06-23 NOTE — Patient Instructions (Signed)
Start low dose ASA at >[redacted] weeks gestation as per USPTF recommendation "Low-Dose Aspirin Use for the Prevention of Morbidity and Mortality From Preeclampsia: Preventive Medicine"  furthermore endorsed by ACOG, WHO, and NIH based on evidence level B for the prevention of preeclampsia  In women deemed high risk  (diabetes, renal disease, chronic hypertension, history of preeclampsia in prior gestation, autoimmune diseases, or multifetal gestations).  ACOG Committee Opinion 743 "Low-Dose Asprin Use During Pregnancy" June 25th 2018  High Risk (Start if 1 or more present) History of preeclampsia Multifetal Gestation Chronic HTN Type I or II DM Renal Disease Autoimmune Disease (SLE, Antiphospholipid antibody syndrome)  Moderate Risk (consider starting if more than one present) Nulliparity Obesity (BMI >30) Family history of Preeclampsia (Mother or sister) Socioeconomic characteristics (African American, low socieeconomic status) Age 35 years or older Personal history factors (low birthweight of SGA, previous adverse pregnancy outcome, more than 10 year pregnancy interval)  

## 2018-06-26 ENCOUNTER — Encounter: Payer: Self-pay | Admitting: Obstetrics and Gynecology

## 2018-06-27 ENCOUNTER — Other Ambulatory Visit: Payer: Self-pay | Admitting: Obstetrics and Gynecology

## 2018-06-27 DIAGNOSIS — O99342 Other mental disorders complicating pregnancy, second trimester: Principal | ICD-10-CM

## 2018-06-27 DIAGNOSIS — F419 Anxiety disorder, unspecified: Secondary | ICD-10-CM

## 2018-06-27 LAB — MATERNIT 21 PLUS CORE, BLOOD
Chromosome 13: NEGATIVE
Chromosome 18: NEGATIVE
Chromosome 21: NEGATIVE
Y Chromosome: NOT DETECTED

## 2018-06-27 MED ORDER — ALPRAZOLAM 0.5 MG PO TABS: 0.5000 mg | ORAL_TABLET | Freq: Two times a day (BID) | ORAL | 3 refills | Status: DC | PRN

## 2018-06-27 NOTE — Progress Notes (Signed)
Patient called the nurse line complaining of panic attacks and requesting refill prescription fro anxiety medication. She was on xanax in the past. Prescription sent to the pharmacy for xanax. Adelene Idlerhristanna Schuman MD [facility], [facility] 06/27/18 9:51 AM

## 2018-06-29 ENCOUNTER — Emergency Department: Payer: Managed Care, Other (non HMO)

## 2018-06-29 ENCOUNTER — Emergency Department
Admission: EM | Admit: 2018-06-29 | Discharge: 2018-06-29 | Disposition: A | Discharge status: Home / Self Care | Payer: Managed Care, Other (non HMO) | Attending: Student in an Organized Health Care Education/Training Program | Admitting: Student in an Organized Health Care Education/Training Program

## 2018-06-29 ENCOUNTER — Other Ambulatory Visit: Payer: Self-pay

## 2018-06-29 DIAGNOSIS — O26812 Pregnancy related exhaustion and fatigue, second trimester: Secondary | ICD-10-CM | POA: Insufficient documentation

## 2018-06-29 DIAGNOSIS — O9989 Other specified diseases and conditions complicating pregnancy, childbirth and the puerperium: Secondary | ICD-10-CM | POA: Insufficient documentation

## 2018-06-29 DIAGNOSIS — Z3A13 13 weeks gestation of pregnancy: Secondary | ICD-10-CM | POA: Diagnosis not present

## 2018-06-29 DIAGNOSIS — R0602 Shortness of breath: Secondary | ICD-10-CM | POA: Insufficient documentation

## 2018-06-29 DIAGNOSIS — F17210 Nicotine dependence, cigarettes, uncomplicated: Secondary | ICD-10-CM | POA: Diagnosis not present

## 2018-06-29 DIAGNOSIS — O2512 Malnutrition in pregnancy, second trimester: Secondary | ICD-10-CM | POA: Insufficient documentation

## 2018-06-29 DIAGNOSIS — Z79899 Other long term (current) drug therapy: Secondary | ICD-10-CM | POA: Diagnosis not present

## 2018-06-29 DIAGNOSIS — R002 Palpitations: Secondary | ICD-10-CM | POA: Diagnosis not present

## 2018-06-29 LAB — URINALYSIS, COMPLETE (UACMP) WITH MICROSCOPIC
Bilirubin Urine: NEGATIVE
GLUCOSE, UA: NEGATIVE mg/dL
Hgb urine dipstick: NEGATIVE
Ketones, ur: 20 mg/dL — AB
Leukocytes, UA: NEGATIVE
NITRITE: NEGATIVE
PH: 6 (ref 5.0–8.0)
Protein, ur: NEGATIVE mg/dL
Specific Gravity, Urine: 1.008 (ref 1.005–1.030)

## 2018-06-29 LAB — CBC
HCT: 36.6 % (ref 35.0–47.0)
HEMOGLOBIN: 13 g/dL (ref 12.0–16.0)
MCH: 30 pg (ref 26.0–34.0)
MCHC: 35.6 g/dL (ref 32.0–36.0)
MCV: 84.3 fL (ref 80.0–100.0)
Platelets: 218 10*3/uL (ref 150–440)
RBC: 4.35 MIL/uL (ref 3.80–5.20)
RDW: 13.1 % (ref 11.5–14.5)
WBC: 8.9 10*3/uL (ref 3.6–11.0)

## 2018-06-29 LAB — BASIC METABOLIC PANEL
Anion gap: 10 (ref 5–15)
BUN: 6 mg/dL (ref 6–20)
CALCIUM: 8.8 mg/dL — AB (ref 8.9–10.3)
CO2: 19 mmol/L — ABNORMAL LOW (ref 22–32)
Chloride: 105 mmol/L (ref 98–111)
Creatinine, Ser: 0.34 mg/dL — ABNORMAL LOW (ref 0.44–1.00)
GLUCOSE: 119 mg/dL — AB (ref 70–99)
Potassium: 3.3 mmol/L — ABNORMAL LOW (ref 3.5–5.1)
SODIUM: 134 mmol/L — AB (ref 135–145)

## 2018-06-29 LAB — FIBRIN DERIVATIVES D-DIMER (ARMC ONLY): Fibrin derivatives D-dimer (ARMC): 363.92 ng/mL (FEU) (ref 0.00–499.00)

## 2018-06-29 LAB — TROPONIN I

## 2018-06-29 MED ORDER — SODIUM CHLORIDE 0.9 % IV BOLUS
1000.0000 mL | Freq: Once | INTRAVENOUS | Status: AC
  Administered 2018-06-29: 1000 mL via INTRAVENOUS

## 2018-06-29 MED ORDER — ALBUTEROL SULFATE (2.5 MG/3ML) 0.083% IN NEBU
2.5000 mg | INHALATION_SOLUTION | Freq: Once | RESPIRATORY_TRACT | Status: AC
  Administered 2018-06-29: 2.5 mg via RESPIRATORY_TRACT
  Filled 2018-06-29: qty 3

## 2018-06-29 MED ORDER — CEPHALEXIN 500 MG PO CAPS
500.0000 mg | ORAL_CAPSULE | Freq: Once | ORAL | Status: AC
  Administered 2018-06-29: 500 mg via ORAL
  Filled 2018-06-29: qty 1

## 2018-06-29 MED ORDER — CEPHALEXIN 500 MG PO CAPS: 500.0000 mg | ORAL_CAPSULE | Freq: Three times a day (TID) | ORAL | 0 refills | Status: AC

## 2018-06-29 NOTE — ED Triage Notes (Signed)
To ER via POV c/o palpitations and SOB since Wednesday. Pt hx of anxiety but states that it does not last this long. Pt reports mild CP and feeling "tired". Pt approx [redacted] weeks pregnant.

## 2018-06-29 NOTE — ED Notes (Addendum)
First Nurse Note: Patient states she is [redacted] weeks pregnant and experienced rapid HR this AM.  Apical Pulse 124 and regular.  To Triage 3 for EKG.  Alert and oriented, skin warm and dry, color good.  Denies known HX of tachycardia.

## 2018-06-29 NOTE — ED Provider Notes (Signed)
[facility] Emergency Department Provider Note    First MD Initiated Contact with Patient 06/29/18 1105     (approximate)  I have reviewed the triage vital signs and the nursing notes.   HISTORY  Chief Complaint Palpitations    HPI Terry Zhang is a 27 y.o. female with a history of anxiety depression who is roughly [redacted] weeks pregnant presents to the ER with 5 days of palpitations and shortness of breath.  States that she was worried that this was a panic attack initially Wednesday but is been constant.  She was called in a prescription of Xanax but she has not wanted to take this due to concern for interaction on the baby.  She denies any vaginal bleeding or discharge.  Does feel some vague shortness of breath and fatigue.  Has had decreased oral intake.  Denies any history of dysrhythmia.  No history of blood clots.  Denies any numbness or tingling.    Past Medical History:  Diagnosis Date  . Anxiety   . Depression   . Hyperlipidemia 02/23/2016  . Obesity    Family History  Problem Relation Age of Onset  . Arthritis Mother   . Cancer Mother [id number]       cervical  . Hyperlipidemia Mother   . Mental illness Mother   . Diabetes Mother   . Hyperlipidemia Father   . Hypertension Father   . Mental illness Father   . Mental illness Maternal Grandfather   . Diabetes Maternal Grandfather   . Pancreatic cancer Maternal Grandmother [id number]   Past Surgical History:  Procedure Laterality Date  . ANKLE SURGERY    . APPENDECTOMY  2008  . BREAST BIOPSY  2003  . TONSILLECTOMY     Patient Active Problem List   Diagnosis Date Noted  . Screening for diabetes mellitus 06/10/2018  . Obesity in pregnancy 05/12/2018  . BMI 40.0-44.9, adult (HCC) 05/12/2018  . History of preterm delivery, currently pregnant 05/12/2018  . Supervision of high risk pregnancy, antepartum 05/06/2018  . Hyperlipidemia 02/23/2016  . Anxiety and depression 08/03/2015      Prior to  Admission medications   Medication Sig Start Date End Date Taking? Authorizing Provider  ALPRAZolam Prudy Feeler(XANAX) 0.5 MG tablet Take 1 tablet (0.5 mg total) by mouth 2 (two) times daily as needed for anxiety. 06/27/18   Schuman, Jaquelyn Bitterhristanna R, MD  cephALEXin (KEFLEX) 500 MG capsule Take 1 capsule (500 mg total) by mouth 3 (three) times daily for 7 days. 06/29/18 07/06/18  Willy Eddyobinson, Gicela Schwarting, MD  Prenatal Vit-Fe Fumarate-FA (MULTIVITAMIN-PRENATAL) 27-0.8 MG TABS tablet Take 1 tablet by mouth daily at 12 noon.    [provider]    Allergies Patient has no known allergies.    Social History Social History   Tobacco Use  . Smoking status: Current Every Day Smoker    Packs/day: 0.25    Types: Cigarettes  . Smokeless tobacco: Never Used  Substance Use Topics  . Alcohol use: No    Alcohol/week: 0.0 oz  . Drug use: No    Review of Systems Patient denies headaches, rhinorrhea, blurry vision, numbness, shortness of breath, chest pain, edema, cough, abdominal pain, nausea, vomiting, diarrhea, dysuria, fevers, rashes or hallucinations unless otherwise stated above in HPI. ____________________________________________   PHYSICAL EXAM:  VITAL SIGNS: Vitals:   06/29/18 1115 06/29/18 1300  BP: 122/74 112/68  Pulse: (!) 106 89  Resp: 13 15  Temp:    SpO2: 99% 97%  Constitutional: Alert and oriented.  Eyes: Conjunctivae are normal.  Head: Atraumatic. Nose: No congestion/rhinnorhea. Mouth/Throat: Mucous membranes are moist.   Neck: No stridor. Painless ROM.  Cardiovascular: tachycardic but regular rhythm. Grossly normal heart sounds.  Good peripheral circulation. Respiratory: Normal respiratory effort.  No retractions. Lungs CTAB. Gastrointestinal: Soft and nontender. No distention. No abdominal bruits. No CVA tenderness. Genitourinary: deferred Musculoskeletal: No lower extremity tenderness nor edema.  No joint effusions. Neurologic:  Normal speech and language. No gross focal  neurologic deficits are appreciated. No facial droop Skin:  Skin is warm, dry and intact. No rash noted. Psychiatric: Mood and affect are normal. Speech and behavior are normal.  ____________________________________________   LABS (all labs ordered are listed, but only abnormal results are displayed)  Results for orders placed or performed during the hospital encounter of 06/29/18 (from the past 24 hour(s))  Basic metabolic panel     Status: Abnormal   Collection Time: 06/29/18 11:20 AM  Result Value Ref Range   Sodium 134 (L) 135 - 145 mmol/L   Potassium 3.3 (L) 3.5 - 5.1 mmol/L   Chloride 105 98 - 111 mmol/L   CO2 19 (L) 22 - 32 mmol/L   Glucose, Bld 119 (H) 70 - 99 mg/dL   BUN 6 6 - 20 mg/dL   Creatinine, Ser 1.61 (L) 0.44 - 1.00 mg/dL   Calcium 8.8 (L) 8.9 - 10.3 mg/dL   GFR calc non Af Amer >60 >60 mL/min   GFR calc Af Amer >60 >60 mL/min   Anion gap 10 5 - 15  CBC     Status: None   Collection Time: 06/29/18 11:20 AM  Result Value Ref Range   WBC 8.9 3.6 - 11.0 K/uL   RBC 4.35 3.80 - 5.20 MIL/uL   Hemoglobin 13.0 12.0 - 16.0 g/dL   HCT 09.6 04.5 - 40.9 %   MCV 84.3 80.0 - 100.0 fL   MCH 30.0 26.0 - 34.0 pg   MCHC 35.6 32.0 - 36.0 g/dL   RDW 81.1 91.4 - 78.2 %   Platelets 218 150 - 440 K/uL  Troponin I     Status: None   Collection Time: 06/29/18 11:20 AM  Result Value Ref Range   Troponin I <0.03 <0.03 ng/mL  Fibrin derivatives D-Dimer ([facility] only)     Status: None   Collection Time: 06/29/18 12:00 PM  Result Value Ref Range   Fibrin derivatives D-dimer ([facility]) 363.92 0.00 - 499.00 ng/mL (FEU)  Urinalysis, Complete w Microscopic     Status: Abnormal   Collection Time: 06/29/18  2:19 PM  Result Value Ref Range   Color, Urine YELLOW (A) YELLOW   APPearance HAZY (A) CLEAR   Specific Gravity, Urine 1.008 1.005 - 1.030   pH 6.0 5.0 - 8.0   Glucose, UA NEGATIVE NEGATIVE mg/dL   Hgb urine dipstick NEGATIVE NEGATIVE   Bilirubin Urine NEGATIVE NEGATIVE   Ketones, ur  20 (A) NEGATIVE mg/dL   Protein, ur NEGATIVE NEGATIVE mg/dL   Nitrite NEGATIVE NEGATIVE   Leukocytes, UA NEGATIVE NEGATIVE   RBC / HPF 0-5 0 - 5 RBC/hpf   WBC, UA 0-5 0 - 5 WBC/hpf   Bacteria, UA MANY (A) NONE SEEN   Squamous Epithelial / LPF 0-5 0 - 5   Mucus PRESENT    ____________________________________________  EKG My review and personal interpretation at Time: 10:53   Indication: palpitations  Rate: 140  Rhythm: sinus Axis: normal Other: normal intervals, no wpw or brugada noted  ____________________________________________  RADIOLOGY  I personally reviewed all radiographic images ordered to evaluate for the above acute complaints and reviewed radiology reports and findings.  These findings were personally discussed with the patient.  Please see medical record for radiology report.  ____________________________________________   PROCEDURES  Procedure(s) performed:  Procedures    Critical Care performed: no ____________________________________________   INITIAL IMPRESSION / ASSESSMENT AND PLAN / ED COURSE  Pertinent labs & imaging results that were available during my care of the patient were reviewed by me and considered in my medical decision making (see chart for details).   DDX: Asthma, copd, CHF, pna, ptx, malignancy, Pe, anemia   Terry Zhang is a 27 y.o. who presents to the ED with symptoms as described above.  Patient is not hypoxic but is markedly tachycardic.  No evidence of preexcitation syndrome.  Normal intervals.  Not clinically consistent with ACS congestive heart failure.  Blood work will be sent for the above differential.  Given her symptoms pregnant state will order d-dimer to further risk stratify for PE.  Do have a suspicion for underlying anxiety but symptoms are longer in duration and do warrant further evaluation.  Will give IV fluids as well as keep patient on monitor.  Clinical Course as of Jun 29 1442  Mon Jun 29, 2018  1441  Reassessed.  Fetal heart tones reassuring no change in symptoms after nebulizer treatment.  Not clinically consistent with bronchitis.  Not clinically consistent with PE.  Patient otherwise well-appearing at this point.  Urinalysis does show evidence of bacteria will treat for cystitis and pregnancy with antibiotics.  Discussed signs and symptoms for which she should return immediately to the hospital.  Have discussed with the patient and available family all diagnostics and treatments performed thus far and all questions were answered to the best of my ability. The patient demonstrates understanding and agreement with plan.    [PR]    Clinical Course User Index [PR] Willy Eddy, MD     As part of my medical decision making, I reviewed the following data within the electronic MEDICAL RECORD NUMBER Nursing notes reviewed and incorporated, Labs reviewed, notes from prior ED visits.   ____________________________________________   FINAL CLINICAL IMPRESSION(S) / ED DIAGNOSES  Final diagnoses:  Palpitations  [redacted] weeks gestation of pregnancy      NEW MEDICATIONS STARTED DURING THIS VISIT:  New Prescriptions   CEPHALEXIN (KEFLEX) 500 MG CAPSULE    Take 1 capsule (500 mg total) by mouth 3 (three) times daily for 7 days.     Note:  This document was prepared using Dragon voice recognition software and may include unintentional dictation errors.    Willy Eddy, MD 06/29/18 1444

## 2018-06-29 NOTE — ED Notes (Signed)
Pt arrives for palpitations and SOB for 5 days. Hx anxiety and usually takes Xanax, took one yesterday and no relief. Pt is [redacted] weeks pregnant, G2P1. +mild chest pain with deep inspiration. [facility]. A & Ox4. Denies any abdominal cramping or vaginal bleeding.

## 2018-07-01 LAB — INHERITEST CORE(CF97,SMA,FRAX)

## 2018-07-01 LAB — GLUCOSE TOLERANCE, 1 HOUR: GLUCOSE, 1HR PP: 93 mg/dL (ref 65–199)

## 2018-07-08 ENCOUNTER — Ambulatory Visit (INDEPENDENT_AMBULATORY_CARE_PROVIDER_SITE_OTHER): Payer: Managed Care, Other (non HMO) | Admitting: Obstetrics and Gynecology

## 2018-07-08 VITALS — BP 112/74 | HR 109 | Wt 290.0 lb

## 2018-07-08 DIAGNOSIS — O99342 Other mental disorders complicating pregnancy, second trimester: Secondary | ICD-10-CM

## 2018-07-08 DIAGNOSIS — O09212 Supervision of pregnancy with history of pre-term labor, second trimester: Secondary | ICD-10-CM

## 2018-07-08 DIAGNOSIS — F418 Other specified anxiety disorders: Secondary | ICD-10-CM

## 2018-07-08 DIAGNOSIS — Z6841 Body Mass Index (BMI) 40.0 and over, adult: Secondary | ICD-10-CM

## 2018-07-08 DIAGNOSIS — O0990 Supervision of high risk pregnancy, unspecified, unspecified trimester: Secondary | ICD-10-CM

## 2018-07-08 DIAGNOSIS — F329 Major depressive disorder, single episode, unspecified: Secondary | ICD-10-CM

## 2018-07-08 DIAGNOSIS — O09219 Supervision of pregnancy with history of pre-term labor, unspecified trimester: Secondary | ICD-10-CM

## 2018-07-08 DIAGNOSIS — O99210 Obesity complicating pregnancy, unspecified trimester: Secondary | ICD-10-CM

## 2018-07-08 DIAGNOSIS — O09899 Supervision of other high risk pregnancies, unspecified trimester: Secondary | ICD-10-CM

## 2018-07-08 DIAGNOSIS — O99212 Obesity complicating pregnancy, second trimester: Secondary | ICD-10-CM

## 2018-07-08 DIAGNOSIS — Z3A14 14 weeks gestation of pregnancy: Secondary | ICD-10-CM

## 2018-07-08 DIAGNOSIS — O9989 Other specified diseases and conditions complicating pregnancy, childbirth and the puerperium: Secondary | ICD-10-CM

## 2018-07-08 DIAGNOSIS — F32A Depression, unspecified: Secondary | ICD-10-CM

## 2018-07-08 DIAGNOSIS — R000 Tachycardia, unspecified: Secondary | ICD-10-CM

## 2018-07-08 DIAGNOSIS — F419 Anxiety disorder, unspecified: Secondary | ICD-10-CM

## 2018-07-09 ENCOUNTER — Encounter: Payer: Self-pay | Admitting: Obstetrics and Gynecology

## 2018-07-09 DIAGNOSIS — R002 Palpitations: Secondary | ICD-10-CM | POA: Insufficient documentation

## 2018-07-09 LAB — TSH+FREE T4
Free T4: 1.2 ng/dL (ref 0.82–1.77)
TSH: 1.18 u[IU]/mL (ref 0.450–4.500)

## 2018-07-09 NOTE — Progress Notes (Incomplete)
  Routine Prenatal Care Visit  Subjective  Terry Zhang is a 27 y.o. G2P0101 at 7022w3d being seen today for ongoing prenatal care.  She is currently monitored for the following issues for this {Blank single:19197::"high-risk","low-risk"} pregnancy and has Anxiety and depression; Hyperlipidemia; Supervision of high risk pregnancy, antepartum; Obesity in pregnancy; BMI 40.0-44.9, adult (HCC); History of preterm delivery, currently pregnant; Screening for diabetes mellitus; and Tachycardia on their problem list.  ----------------------------------------------------------------------------------- Patient reports {sx:14538}.   Contractions: Not present. Vag. Bleeding: None.  Movement: Absent. Denies leaking of fluid.  ----------------------------------------------------------------------------------- The following portions of the patient's history were reviewed and updated as appropriate: allergies, current medications, past family history, past medical history, past social history, past surgical history and problem list. Problem list updated.   Objective  Blood pressure 112/74, pulse (!) 109, weight 290 lb (131.5 kg), last menstrual period 03/14/2018. Pregravid weight 272 lb (123.4 kg) Total Weight Gain 18 lb (8.165 kg) Urinalysis: Urine Protein: Negative Urine Glucose: Negative  Fetal Status: Fetal Heart Rate (bpm): Present   Movement: Absent     General:  Alert, oriented and cooperative. Patient is in no acute distress.  Skin: Skin is warm and dry. No rash noted.   Cardiovascular: Normal heart rate noted  Respiratory: Normal respiratory effort, no problems with respiration noted  Abdomen: Soft, gravid, appropriate for gestational age. Pain/Pressure: Absent     Pelvic:  {Blank single:19197::"Cervical exam performed","Cervical exam deferred"}        Extremities: Normal range of motion.     Mental Status: Normal mood and affect. Normal behavior. Normal judgment and thought content.    Assessment   27 y.o. G2P0101 at 822w3d by  01/04/2019, by Ultrasound presenting for {Blank single:19197::"routine","work-in"} prenatal visit  Plan   pregnancy 2  Problems (from 03/14/18 to present)    Problem Noted Resolved   Tachycardia 07/08/2018 by Conard NovakJackson, Mylissa Lambe D, MD No   History of preterm delivery, currently pregnant 05/12/2018 by Farrel ConnersGutierrez, Colleen, CNM No   Overview Signed 05/12/2018  3:38 PM by Farrel ConnersGutierrez, Colleen, CNM    Spontaneous labor and delivery of G1. Offered 17 P          {Blank single:19197::"Term","Preterm"} labor symptoms and general obstetric precautions including but not limited to vaginal bleeding, contractions, leaking of fluid and fetal movement were reviewed in detail with the patient. Please refer to After Visit Summary for other counseling recommendations.   Return in about 2 weeks (around 07/22/2018) for Routine Prenatal Appointment.  Thomasene MohairStephen Delonna Ney, MD, Merlinda FrederickFACOG [facility], [facility] 07/09/2018 4:47 PM

## 2018-07-09 NOTE — Progress Notes (Signed)
Cardiology Office Note  Date:  07/10/2018   ID:  Terry Zhang, DOB 01-30-1991, MRN [id number]  PCP:  Soundra PilonBrake, Andrew R, FNP   Chief Complaint  Patient presents with  . New Patient (Initial Visit)    Referred by OB for fast heart rate. Meds reviewed verbally with patient.     HPI:  Terry Zhang is a 27 y.o. female with a history of  Anxiety/depression  [redacted] weeks pregnant  Who presents by referral from Dr. Thomasene MohairStephen Jackson for evaluation of her tachycardia  She reports that after she learned she was pregnant she stopped her Cymbalta, Xanax and stop smoking Reports she had been doing well until episode recently And of June, was relaxing, nothing stressful when she developed fullness in the chest and tachycardia. Took a Xanax eventually and went to sleep Woke up with similar symptoms tachycardia shortness of breath Symptoms seemed to progress and eventually she presented to the ER 06/29/2018   concerned it was a panic attack Reports heart rates up to 140-160, Even 1 Measurement of heart rate up to 200. Does when she decided to go to the emergency room  In the emergency room was given IV fluids and a breathing treatment Blood pressure came back to her normal heart rate normalized and sent home He was felt her UA was positive and she was given antibiotics Baby was monitored and felt to be behaving appropriately Lab work normal  Since then she has felt fine with no further episodes though feels that she needs to start Zoloft Previously was taking Zoloft 25 twice a day. She has some leftover medication at home  Prior to the stent is doing well with no shortness of breath near-syncope syncope palpitations or leg edema. No PND or orthopnea  EKG personally reviewed by myself on todays visit Shows normal sinus rhythm rate 90 bpm no significant ST or T-wave changes   PMH:   has a past medical history of Anxiety, Depression, Hyperlipidemia (02/23/2016), and Obesity.  PSH:     Past Surgical History:  Procedure Laterality Date  . ANKLE SURGERY    . APPENDECTOMY  2008  . BREAST BIOPSY  2003  . TONSILLECTOMY      Current Outpatient Medications  Medication Sig Dispense Refill  . ALPRAZolam (XANAX) 0.5 MG tablet Take 1 tablet (0.5 mg total) by mouth 2 (two) times daily as needed for anxiety. 30 tablet 3  . Prenatal Vit-Fe Fumarate-FA (MULTIVITAMIN-PRENATAL) 27-0.8 MG TABS tablet Take 1 tablet by mouth daily at 12 noon.     No current facility-administered medications for this visit.      Allergies:   Patient has no known allergies.   Social History:  The patient  reports that she has been smoking cigarettes.  She has been smoking about 0.25 packs per day. She has never used smokeless tobacco. She reports that she does not drink alcohol or use drugs.   Family History:   family history includes Arthritis in her mother; Cancer (age of onset: 4554) in her mother; Diabetes in her maternal grandfather and mother; Hyperlipidemia in her father and mother; Hypertension in her father; Mental illness in her father, maternal grandfather, and mother; Pancreatic cancer (age of onset: 3073) in her maternal grandmother.    Review of Systems: Review of Systems  Constitutional: Negative.   Respiratory: Negative.   Cardiovascular: Positive for palpitations.       Tachycardia  Gastrointestinal: Negative.   Musculoskeletal: Negative.   Neurological: Negative.   Psychiatric/Behavioral:  Negative.   All other systems reviewed and are negative.    PHYSICAL EXAM: VS:  BP 136/83 (BP Location: Right Arm, Patient Position: Sitting, Cuff Size: Large)   Pulse 90   Ht 5' 7.5" (1.715 m)   Wt 289 lb (131.1 kg)   LMP 03/14/2018 (Exact Date)   BMI 44.60 kg/m  , BMI Body mass index is 44.6 kg/m. GEN: Well nourished, well developed, in no acute distress . obese HEENT: normal  Neck: no JVD, carotid bruits, or masses Cardiac: RRR; no murmurs, rubs, or gallops,no edema  Respiratory:   clear to auscultation bilaterally, normal work of breathing GI: soft, nontender, nondistended, + BS MS: no deformity or atrophy  Skin: warm and dry, no rash Neuro:  Strength and sensation are intact Psych: euthymic mood, full affect   Recent Labs: 06/29/2018: BUN 6; Creatinine, Ser 0.34; Hemoglobin 13.0; Platelets 218; Potassium 3.3; Sodium 134 07/08/2018: TSH 1.180    Lipid Panel No results found for: CHOL, HDL, LDLCALC, TRIG    Wt Readings from Last 3 Encounters:  07/10/18 289 lb (131.1 kg)  07/08/18 290 lb (131.5 kg)  06/29/18 287 lb (130.2 kg)       ASSESSMENT AND PLAN:  Tachycardia - Plan: EKG 12-Lead Sinus tachycardia appreciated on EKG She does report heart rate up to 200 bpm when measured by her friend prior to going to the emergency room This was not documented on telemetry or EKG Unable to exclude short run of SVT Talked about beta blockers for recurrent symptoms Suspect episode likely secondary to anxiety or panic attack, less likely from primary arrhythmia event Would consider use of labetalol upper propranolol for recurrent symptoms ( on as-needed basis) -She has indicated she will be restarting her Zoloft, and she does have her Xanax I suspect between Zoloft and Xanax this should help control her symptoms No further cardiac workup needed at this time Normal EKG and clinical exam  BMI 40.0-44.9, adult (HCC) - Plan: EKG 12-Lead We have encouraged continued exercise, careful diet management in an effort to lose weight.  Mixed hyperlipidemia Listed in her chart, no numbers available  Anxiety and depression Previously on Cymbalta. Has some Zoloft left at home which she has indicated she would like to restart. She has indicated she has discussed this with OB and will check in with them again next week  Disposition:   F/U  As needed   Total encounter time more than 60 minutes  Greater than 50% was spent in counseling and coordination of care with the  patient  Patient was seen in consultation for Dr. Thomasene Mohair and will be referred back to his care for ongoing management of the she's detailed above    Orders Placed This Encounter  Procedures  . EKG 12-Lead     Signed, Dossie Arbour, M.D., Ph.D. 07/10/2018  [facility] [place], [place] [phone]

## 2018-07-09 NOTE — Progress Notes (Signed)
Routine Prenatal Care Visit  Subjective  Terry Zhang is a 27 y.o. G2P0101 at 7816w3d being seen today for ongoing prenatal care.  She is currently monitored for the following issues for this high-risk pregnancy and has Anxiety and depression; Hyperlipidemia; Supervision of high risk pregnancy, antepartum; Obesity in pregnancy; BMI 40.0-44.9, adult (HCC); History of preterm delivery, currently pregnant; Screening for diabetes mellitus; and Tachycardia on their problem list.  ----------------------------------------------------------------------------------- Patient reports going to the ER last week for an episode of what felt like a panic attack.  She felt like her heart was racing and that she was short of breath. She was evaluated and cleared in the ER.  She continues to feel short of breath. This is unrelieved by albuterol (this did make her heart rate faster).  She was noted to have a heart rate to the 140s during her ER visit.  She has tired a single dose of xanax which did not relieve her symptoms (thinking she was having anxiety attack).  She is worried  Something else is wrong. In the ER she had a chest x-ray that showed bronchitis and she took a course of keflex for this, which improved her symptoms while she was taking it.  However, now her symptoms have returned. She has no cardiac history. No history of DVT/PE.  She was cleared from a PE standpoint in the ER, but did not have duplex doppler ultrasound in her extremities nor CT scan. She did have a normal d-dimer in the ER.  She has had a little swelling in her hands and feet, but no asymmetric swelling in any extremity.  Contractions: Not present. Vag. Bleeding: None.  Movement: Absent. Denies leaking of fluid.  ----------------------------------------------------------------------------------- The following portions of the patient's history were reviewed and updated as appropriate: allergies, current medications, past family history,  past medical history, past social history, past surgical history and pro;blem list. Problem list updated.   Objective  Blood pressure 112/74, pulse (!) 109, weight 290 lb (131.5 kg), last menstrual period 03/14/2018. Pregravid weight 272 lb (123.4 kg) Total Weight Gain 18 lb (8.165 kg) Urinalysis: Urine Protein: Negative Urine Glucose: Negative  Fetal Status: Fetal Heart Rate (bpm): Present   Movement: Absent     General:  Alert, oriented and cooperative. Patient is in no acute distress.  Skin: Skin is warm and dry. No rash noted.   Cardiovascular: Normal heart rate noted  Respiratory: She appears to work to breath a little, but her lungs are clear and she is using no accessory muscles.    Abdomen: Soft, gravid, appropriate for gestational age. Pain/Pressure: Absent     Pelvic:  Cervical exam deferred        Extremities: Normal range of motion.   trace edema in her extremities which is symmetric.  Mental Status: Normal mood and affect. Normal behavior. Normal judgment and thought content.   Assessment   27 y.o. G2P0101 at 7016w3d by  01/04/2019, by Ultrasound presenting for routine prenatal visit  Plan   pregnancy 2  Problems (from 03/14/18 to present)    Problem Noted Resolved   Tachycardia 07/08/2018 by Conard NovakJackson, Nathen Balaban D, MD No   History of preterm delivery, currently pregnant 05/12/2018 by Farrel ConnersGutierrez, Colleen, CNM No   Overview Signed 05/12/2018  3:38 PM by Farrel ConnersGutierrez, Colleen, CNM    Spontaneous labor and delivery of G1. Offered 17 P          Preterm labor symptoms and general obstetric precautions including but not limited to vaginal  bleeding, contractions, leaking of fluid and fetal movement were reviewed in detail with the patient. Please refer to After Visit Summary for other counseling recommendations.   -for her tachycardia: referral to cardiology.  - will consider CTPA to assess for PE, if symptoms worsen, though she did have a normal d-dimer at the hospital.  -  precautions to return to ER ASAP should her symptoms worsen.  - Thyroid function tests to assess for thyroid issues.   Return in about 2 weeks (around 07/22/2018) for Routine Prenatal Appointment.  Thomasene Mohair, MD, [facility], [facility] 07/09/2018 4:47 PM

## 2018-07-10 ENCOUNTER — Ambulatory Visit: Payer: Managed Care, Other (non HMO) | Admitting: Cardiovascular Disease

## 2018-07-10 ENCOUNTER — Encounter: Payer: Self-pay | Admitting: Cardiovascular Disease

## 2018-07-10 VITALS — BP 136/83 | HR 90 | Ht 67.5 in | Wt 289.0 lb

## 2018-07-10 DIAGNOSIS — E782 Mixed hyperlipidemia: Secondary | ICD-10-CM | POA: Diagnosis not present

## 2018-07-10 DIAGNOSIS — Z6841 Body Mass Index (BMI) 40.0 and over, adult: Secondary | ICD-10-CM

## 2018-07-10 DIAGNOSIS — F329 Major depressive disorder, single episode, unspecified: Secondary | ICD-10-CM

## 2018-07-10 DIAGNOSIS — I479 Paroxysmal tachycardia, unspecified: Secondary | ICD-10-CM | POA: Diagnosis not present

## 2018-07-10 DIAGNOSIS — R002 Palpitations: Secondary | ICD-10-CM

## 2018-07-10 DIAGNOSIS — F419 Anxiety disorder, unspecified: Secondary | ICD-10-CM

## 2018-07-10 NOTE — Patient Instructions (Signed)

## 2018-07-21 ENCOUNTER — Encounter (INDEPENDENT_AMBULATORY_CARE_PROVIDER_SITE_OTHER): Payer: Self-pay

## 2018-07-21 ENCOUNTER — Ambulatory Visit (INDEPENDENT_AMBULATORY_CARE_PROVIDER_SITE_OTHER): Payer: Managed Care, Other (non HMO)

## 2018-07-21 ENCOUNTER — Ambulatory Visit (INDEPENDENT_AMBULATORY_CARE_PROVIDER_SITE_OTHER): Payer: Managed Care, Other (non HMO) | Admitting: Obstetrics and Gynecology

## 2018-07-21 VITALS — BP 120/72 | Wt 290.0 lb

## 2018-07-21 DIAGNOSIS — O09212 Supervision of pregnancy with history of pre-term labor, second trimester: Secondary | ICD-10-CM

## 2018-07-21 DIAGNOSIS — O99212 Obesity complicating pregnancy, second trimester: Secondary | ICD-10-CM

## 2018-07-21 DIAGNOSIS — O99342 Other mental disorders complicating pregnancy, second trimester: Secondary | ICD-10-CM

## 2018-07-21 DIAGNOSIS — O09899 Supervision of other high risk pregnancies, unspecified trimester: Secondary | ICD-10-CM

## 2018-07-21 DIAGNOSIS — O09219 Supervision of pregnancy with history of pre-term labor, unspecified trimester: Secondary | ICD-10-CM

## 2018-07-21 DIAGNOSIS — I479 Paroxysmal tachycardia, unspecified: Secondary | ICD-10-CM

## 2018-07-21 DIAGNOSIS — Z6841 Body Mass Index (BMI) 40.0 and over, adult: Secondary | ICD-10-CM

## 2018-07-21 DIAGNOSIS — O0990 Supervision of high risk pregnancy, unspecified, unspecified trimester: Secondary | ICD-10-CM

## 2018-07-21 DIAGNOSIS — Z3A16 16 weeks gestation of pregnancy: Secondary | ICD-10-CM

## 2018-07-21 DIAGNOSIS — O99210 Obesity complicating pregnancy, unspecified trimester: Secondary | ICD-10-CM

## 2018-07-21 DIAGNOSIS — F419 Anxiety disorder, unspecified: Secondary | ICD-10-CM

## 2018-07-21 DIAGNOSIS — Z363 Encounter for antenatal screening for malformations: Secondary | ICD-10-CM

## 2018-07-21 DIAGNOSIS — F329 Major depressive disorder, single episode, unspecified: Secondary | ICD-10-CM

## 2018-07-21 MED ORDER — SERTRALINE HCL 50 MG PO TABS: 50.0000 mg | ORAL_TABLET | Freq: Every day | ORAL | 6 refills | Status: DC

## 2018-07-21 NOTE — Progress Notes (Signed)
Routine Prenatal Care Visit  Subjective  Terry Zhang is a 27 y.o. G2P0101 at [redacted]w[redacted]d being seen today for ongoing prenatal care.  She is currently monitored for the following issues for this high-risk pregnancy and has Anxiety and depression; Hyperlipidemia; Supervision of high risk pregnancy, antepartum; Obesity in pregnancy; BMI 40.0-44.9, adult (HCC); History of preterm delivery, currently pregnant; and Paroxysmal tachycardia (HCC) on their problem list.  ----------------------------------------------------------------------------------- Patient reports no complaints.   Contractions: Not present. Vag. Bleeding: None.  Movement: Present. Denies leaking of fluid.  ----------------------------------------------------------------------------------- The following portions of the patient's history were reviewed and updated as appropriate: allergies, current medications, past family history, past medical history, past social history, past surgical history and problem list. Problem list updated.   Objective  Blood pressure 120/72, weight 290 lb (131.5 kg), last menstrual period 03/14/2018. Pregravid weight 272 lb (123.4 kg) Total Weight Gain 18 lb (8.165 kg) Urinalysis:      Fetal Status: Fetal Heart Rate (bpm): 140   Movement: Present     General:  Alert, oriented and cooperative. Patient is in no acute distress.  Skin: Skin is warm and dry. No rash noted.   Cardiovascular: Normal heart rate noted  Respiratory: Normal respiratory effort, no problems with respiration noted  Abdomen: Soft, gravid, appropriate for gestational age. Pain/Pressure: Absent     Pelvic:  Cervical exam deferred        Extremities: Normal range of motion.     ental Status: Normal mood and affect. Normal behavior. Normal judgment and thought content.   Dg Chest 2 View  Result Date: 06/29/2018 CLINICAL DATA:  Palpitations and shortness of breath for 5 days, [redacted] weeks pregnant EXAM: CHEST - 2 VIEW COMPARISON:   01/23/2012 FINDINGS: Normal heart size, mediastinal contours, and pulmonary vascularity. Minimal chronic peribronchial thickening. Lungs clear. No infiltrate, pleural effusion or pneumothorax. Bones unremarkable. IMPRESSION: Minimal chronic bronchitic changes without infiltrate. Electronically Signed   By: Ulyses Southward M.D.   On: 06/29/2018 11:44   US Ob Transvaginal  Result Date: 07/21/2018 ULTRASOUND REPORT Patient Name: Terry Zhang DOB: [birth year] MRN: [id number] Location: [facility] Date of Service: 07/21/2018 Indications:Previous preterm delivery Findings: Mason Jim intrauterine pregnancy is visualized. FHR: 141 bpm Stomach:  visualized Kidneys: visualized Bladder: visualized Transvaginal cervical length performed. Cervical length measures  4.12 cm in the shortest dimension. There is no change with fundal pressure. No funneling is present. Impression: 1. [redacted]w[redacted]d viable Singleton Intrauterine pregnancy by previously established criteria. 2. Cervical length is 4.12 cm. Recommendations: 1.Clinical correlation with the patient's History and Physical Exam. Willette Alma, RDMS, RVT Normal 16 week cervical length on scan today without evidence of dynamic changes or funneling. Vena Austria, MD, Evern Core [facility], [facility] 07/21/2018, 2:17 PM     Assessment   27 y.o. G2P0101 at [redacted]w[redacted]d by  01/04/2019, by Ultrasound presenting for routine prenatal visit  Plan   pregnancy 2  Problems (from 03/14/18 to present)    Problem Noted Resolved   Obesity in pregnancy 05/12/2018 by Farrel Conners, CNM No   Overview Addendum 07/21/2018  2:56 PM by Vena Austria, MD    BMI >=40 [X]  early 1h gtt - 93 [ ]  bASA (>12 weeks) [ ]  Growth u/s 28 [ ] , 32 [ ] , 36 weeks [ ]  [ ]  NST/AFI weekly 36+ weeks (36[] , 37[] , 38[] , 39[] , 40[] ) [ ]  IOL by 41 weeks (scheduled, prn [] )       History of preterm delivery, currently pregnant 05/12/2018 by  Farrel ConnersGutierrez, Colleen, CNM No   Overview  Addendum 07/21/2018  2:52 PM by Vena AustriaStaebler, Icy Fuhrmann, MD    Spontaneous labor and delivery of G1. Offered 17 P Baseline cervical length 4.12cm 07/21/2018      Supervision of high risk pregnancy, antepartum 05/06/2018 by Nadara MustardHarris, Robert P, MD No   Overview Addendum 07/02/2018  8:27 AM by Vena AustriaStaebler, Marquett Bertoli, MD    Clinic [facility] Prenatal Labs  Dating 6wk1d ultrasound Blood type: A/Positive/-- (05/08 1452)   Genetic Screen NIPS: Normal XX, Inheritest negative Antibody:Negative (05/08 1452)  Anatomic US  Rubella: 1.36 (05/08 1452) Varicella: immune  GTT Early:93 Third trimester:  RPR: Non Reactive (05/08 1452)   Rhogam  HBsAg: Negative (05/08 1452)   TDaP vaccine                       Flu Shot: HIV: Non Reactive (05/08 1452)   Baby Food                                GBS:   Contraception  Pap: 05/06/2018 NIL  CBB   Pelvis tested to 6lbs 5oz  CS/VBAC N/A   Support Person            Anxiety and depression 08/03/2015 by Doreene Nestlark, Katherine K, NP No   Overview Signed 07/21/2018  2:55 PM by Vena AustriaStaebler, Juwann Sherk, MD    Zoloft Rx 50mg  daily 07/21/2018      Tachycardia 07/08/2018 by Conard NovakJackson, Stephen D, MD 07/21/2018 by Vena AustriaStaebler, Kunta Hilleary, MD       Gestational age appropriate obstetric precautions including but not limited to vaginal bleeding, contractions, leaking of fluid and fetal movement were reviewed in detail with the patient.   - normal cervical length, declines Makena at present unless shortening on follow up - cervical length at 18 weeks and 20 weeks (anatomy scan)  Start low dose ASA at >[redacted] weeks gestation as per USPTF recommendation "Low-Dose Aspirin Use for the Prevention of Morbidity and Mortality From Preeclampsia: Preventive Medicine"  furthermore endorsed by ACOG, WHO, and NIH based on evidence level B for the prevention of preeclampsia  In women deemed high risk  (diabetes, renal disease, chronic hypertension, history of preeclampsia in prior gestation, autoimmune diseases, or multifetal  gestations).  ACOG Committee Opinion 743 "Low-Dose Asprin Use During Pregnancy" June 25th 2018  High Risk (Start if 1 or more present) History of preeclampsia Multifetal Gestation Chronic HTN Type I or II DM Renal Disease Autoimmune Disease (SLE, Antiphospholipid antibody syndrome)  Moderate Risk (consider starting if more than one present) Nulliparity Obesity (BMI >30) Family history of Preeclampsia (Mother or sister) Socioeconomic characteristics (African American, low socieeconomic status) Age 29 years or older Personal history factors (low birthweight of SGA, previous adverse pregnancy outcome, more than 10 year pregnancy interval)   Return in about 2 weeks (around 08/04/2018) for ROB and cervical length 2 weeks, 4 weeks ROB and anatomy scan.  Vena AustriaAndreas Faylynn Stamos, MD, [facility]inda FrederickFACOG [facility], [facility] 07/21/2018, 2:53 PM

## 2018-07-21 NOTE — Progress Notes (Signed)
Rob Cervical length U/S

## 2018-08-04 ENCOUNTER — Ambulatory Visit (INDEPENDENT_AMBULATORY_CARE_PROVIDER_SITE_OTHER): Payer: Managed Care, Other (non HMO) | Admitting: Obstetrics and Gynecology

## 2018-08-04 ENCOUNTER — Other Ambulatory Visit: Payer: Managed Care, Other (non HMO)

## 2018-08-04 ENCOUNTER — Ambulatory Visit (INDEPENDENT_AMBULATORY_CARE_PROVIDER_SITE_OTHER): Payer: Managed Care, Other (non HMO)

## 2018-08-04 ENCOUNTER — Encounter: Payer: Managed Care, Other (non HMO) | Admitting: Obstetrics and Gynecology

## 2018-08-04 VITALS — BP 110/82 | Wt 291.0 lb

## 2018-08-04 DIAGNOSIS — O99210 Obesity complicating pregnancy, unspecified trimester: Secondary | ICD-10-CM

## 2018-08-04 DIAGNOSIS — O09219 Supervision of pregnancy with history of pre-term labor, unspecified trimester: Secondary | ICD-10-CM

## 2018-08-04 DIAGNOSIS — O99212 Obesity complicating pregnancy, second trimester: Secondary | ICD-10-CM

## 2018-08-04 DIAGNOSIS — Z363 Encounter for antenatal screening for malformations: Secondary | ICD-10-CM

## 2018-08-04 DIAGNOSIS — F418 Other specified anxiety disorders: Secondary | ICD-10-CM

## 2018-08-04 DIAGNOSIS — O0990 Supervision of high risk pregnancy, unspecified, unspecified trimester: Secondary | ICD-10-CM

## 2018-08-04 DIAGNOSIS — O09212 Supervision of pregnancy with history of pre-term labor, second trimester: Secondary | ICD-10-CM | POA: Diagnosis not present

## 2018-08-04 DIAGNOSIS — O09899 Supervision of other high risk pregnancies, unspecified trimester: Secondary | ICD-10-CM

## 2018-08-04 DIAGNOSIS — Z3A18 18 weeks gestation of pregnancy: Secondary | ICD-10-CM

## 2018-08-04 DIAGNOSIS — O99342 Other mental disorders complicating pregnancy, second trimester: Secondary | ICD-10-CM

## 2018-08-04 NOTE — Progress Notes (Signed)
ROB Cervical length U/S 

## 2018-08-04 NOTE — Progress Notes (Signed)
Routine Prenatal Care Visit  Subjective  Terry Zhang is a 27 y.o. G2P0101 at [redacted]w[redacted]d being seen today for ongoing prenatal care.  She is currently monitored for the following issues for this low-risk pregnancy and has Anxiety and depression; Hyperlipidemia; Supervision of high risk pregnancy, antepartum; Obesity in pregnancy; History of preterm delivery, currently pregnant; and Paroxysmal tachycardia (HCC) on their problem list.  ----------------------------------------------------------------------------------- Patient reports no complaints.   Contractions: Not present. Vag. Bleeding: None.  Movement: Present. Denies leaking of fluid.  ----------------------------------------------------------------------------------- The following portions of the patient's history were reviewed and updated as appropriate: allergies, current medications, past family history, past medical history, past social history, past surgical history and problem list. Problem list updated.   Objective  Blood pressure 110/82, weight 291 lb (132 kg), last menstrual period 03/14/2018. Pregravid weight 272 lb (123.4 kg) Total Weight Gain 19 lb (8.618 kg) Urinalysis: Urine Protein: Negative Urine Glucose: Negative  Fetal Status: Fetal Heart Rate (bpm): 135   Movement: Present     General:  Alert, oriented and cooperative. Patient is in no acute distress.  Skin: Skin is warm and dry. No rash noted.   Cardiovascular: Normal heart rate noted  Respiratory: Normal respiratory effort, no problems with respiration noted  Abdomen: Soft, gravid, appropriate for gestational age. Pain/Pressure: Absent     Pelvic:  Cervical exam deferred        Extremities: Normal range of motion.     ental Status: Normal mood and affect. Normal behavior. Normal judgment and thought content.   US Ob Transvaginal  Result Date: 08/04/2018 ULTRASOUND REPORT Location: [facility] Date of Service: 08/04/2018 Indications:Previous preterm  delivery Findings: Mason Jim intrauterine pregnancy is visualized. FHR: 143 bpm Stomach:  visualized Kidneys: visualized Bladder: visualized Transvaginal cervical length performed. Cervical length measures  4.53 cm in the shortest dimension. There is no change with fundal pressure. No funneling is present. Impression: 1. [redacted]w[redacted]d viable Singleton Intrauterine pregnancy by previously established criteria. 2. Cervical length is 4.53 cm. Recommendations: 1.Clinical correlation with the patient's History and Physical Exam. Mital bahen P Patel, RDMS Normal cervical length ultrasound. Vena Austria, MD, [facility], [facility] 08/04/2018, 2:37 PM   US Ob Transvaginal  Result Date: 07/21/2018 ULTRASOUND REPORT Patient Name: Terry Zhang DOB: [birth year] MRN: [id number] Location: [facility] Date of Service: 07/21/2018 Indications:Previous preterm delivery Findings: Mason Jim intrauterine pregnancy is visualized. FHR: 141 bpm Stomach:  visualized Kidneys: visualized Bladder: visualized Transvaginal cervical length performed. Cervical length measures  4.12 cm in the shortest dimension. There is no change with fundal pressure. No funneling is present. Impression: 1. [redacted]w[redacted]d viable Singleton Intrauterine pregnancy by previously established criteria. 2. Cervical length is 4.12 cm. Recommendations: 1.Clinical correlation with the patient's History and Physical Exam. Willette Alma, RDMS, RVT Normal 16 week cervical length on scan today without evidence of dynamic changes or funneling. Vena Austria, MD, Evern Core [facility], [facility] 07/21/2018, 2:17 PM      Assessment   27 y.o. G2P0101 at [redacted]w[redacted]d by  01/04/2019, by Ultrasound presenting for routine prenatal visit  Plan   pregnancy 2  Problems (from 03/14/18 to present)    Problem Noted Resolved   Obesity in pregnancy 05/12/2018 by Farrel Conners, CNM No   Overview Addendum 07/21/2018  2:56 PM by Vena Austria, MD    BMI >=40 [X]  early 1h gtt - 93 [ ]  bASA (>12 weeks) [ ]  Growth u/s 28 [ ] , 32 [ ] , 36 weeks [ ]  [ ]   NST/AFI weekly 36+ weeks (36[] , 37[] , 38[] , 39[] , 40[] ) [ ]  IOL by 41 weeks (scheduled, prn [] )       History of preterm delivery, currently pregnant 05/12/2018 by Farrel ConnersGutierrez, Colleen, CNM No   Overview Addendum 07/21/2018  2:52 PM by Vena AustriaStaebler, Betina Puckett, MD    Spontaneous labor and delivery of G1. Offered 17 P Baseline cervical length 4.12cm 07/21/2018      Supervision of high risk pregnancy, antepartum 05/06/2018 by Nadara MustardHarris, Robert P, MD No   Overview Addendum 07/21/2018  3:01 PM by Vena AustriaStaebler, Sharyn Brilliant, MD    Clinic [facility] Prenatal Labs  Dating 6wk1d ultrasound Blood type: A/Positive/-- (05/08 1452)   Genetic Screen NIPS: Normal XX, Inheritest negative Antibody:Negative (05/08 1452)  Anatomic US  Rubella: 1.36 (05/08 1452) Varicella: immune  GTT Early:93 Third trimester:  RPR: Non Reactive (05/08 1452)   Rhogam  HBsAg: Negative (05/08 1452)   TDaP vaccine                       Flu Shot: HIV: Non Reactive (05/08 1452)   Baby Food                                GBS:   Contraception  Pap: 05/06/2017 NIL  CBB   Pelvis tested to 6lbs 5oz  CS/VBAC    Support Person            Anxiety and depression 08/03/2015 by Doreene Nestlark, Katherine K, NP No   Overview Signed 07/21/2018  2:55 PM by Vena AustriaStaebler, Shanda Cadotte, MD    Zoloft Rx 50mg  daily 07/21/2018      Tachycardia 07/08/2018 by Conard NovakJackson, Stephen D, MD 07/21/2018 by Vena AustriaStaebler, Burnell Matlin, MD       Gestational age appropriate obstetric precautions including but not limited to vaginal bleeding, contractions, leaking of fluid and fetal movement were reviewed in detail with the patient.   - cervical length stable, declines Makena - Anatomy scan next visit  Return in about 2 weeks (around 08/18/2018) for ROB and anatomy scan.  Vena AustriaAndreas Houa Nie, MD, Evern CoreFACOG [facility], [facility] 08/04/2018, 2:44 PM

## 2018-08-18 ENCOUNTER — Other Ambulatory Visit: Payer: Managed Care, Other (non HMO)

## 2018-08-18 ENCOUNTER — Ambulatory Visit (INDEPENDENT_AMBULATORY_CARE_PROVIDER_SITE_OTHER): Payer: Managed Care, Other (non HMO) | Admitting: Obstetrics and Gynecology

## 2018-08-18 ENCOUNTER — Ambulatory Visit (INDEPENDENT_AMBULATORY_CARE_PROVIDER_SITE_OTHER): Payer: Managed Care, Other (non HMO)

## 2018-08-18 VITALS — BP 126/78 | Wt 292.0 lb

## 2018-08-18 DIAGNOSIS — O0990 Supervision of high risk pregnancy, unspecified, unspecified trimester: Secondary | ICD-10-CM

## 2018-08-18 DIAGNOSIS — O09899 Supervision of other high risk pregnancies, unspecified trimester: Secondary | ICD-10-CM

## 2018-08-18 DIAGNOSIS — Z363 Encounter for antenatal screening for malformations: Secondary | ICD-10-CM

## 2018-08-18 DIAGNOSIS — Z3A20 20 weeks gestation of pregnancy: Secondary | ICD-10-CM

## 2018-08-18 DIAGNOSIS — O09212 Supervision of pregnancy with history of pre-term labor, second trimester: Secondary | ICD-10-CM

## 2018-08-18 DIAGNOSIS — Z0489 Encounter for examination and observation for other specified reasons: Secondary | ICD-10-CM

## 2018-08-18 DIAGNOSIS — IMO0002 Reserved for concepts with insufficient information to code with codable children: Secondary | ICD-10-CM

## 2018-08-18 DIAGNOSIS — O09219 Supervision of pregnancy with history of pre-term labor, unspecified trimester: Principal | ICD-10-CM

## 2018-08-18 LAB — POCT URINALYSIS DIPSTICK OB
Glucose, UA: NEGATIVE — AB
POC,PROTEIN,UA: NEGATIVE

## 2018-08-18 MED ORDER — SERTRALINE HCL 50 MG PO TABS: 50.0000 mg | ORAL_TABLET | Freq: Every day | ORAL | 3 refills | Status: DC

## 2018-08-18 NOTE — Progress Notes (Signed)
ROB Anatomy scan today It is a GIRL 

## 2018-08-18 NOTE — Progress Notes (Signed)
Routine Prenatal Care Visit  Subjective  Terry Zhang is a 27 y.o. G2P0101 at 4139w1d being seen today for ongoing prenatal care.  She is currently monitored for the following issues for this high-risk pregnancy and has Anxiety and depression; Hyperlipidemia; Supervision of high risk pregnancy, antepartum; Obesity in pregnancy; History of preterm delivery, currently pregnant; and Paroxysmal tachycardia (HCC) on their problem list.  ----------------------------------------------------------------------------------- Patient reports no complaints.   Contractions: Not present. Vag. Bleeding: None.  Movement: Present. Denies leaking of fluid.  ----------------------------------------------------------------------------------- The following portions of the patient's history were reviewed and updated as appropriate: allergies, current medications, past family history, past medical history, past social history, past surgical history and problem list. Problem list updated.   Objective  Blood pressure 126/78, weight 292 lb (132.5 kg), last menstrual period 03/14/2018. Pregravid weight 272 lb (123.4 kg) Total Weight Gain 20 lb (9.072 kg) Urinalysis:      Fetal Status: Fetal Heart Rate (bpm): 140   Movement: Present     General:  Alert, oriented and cooperative. Patient is in no acute distress.  Skin: Skin is warm and dry. No rash noted.   Cardiovascular: Normal heart rate noted  Respiratory: Normal respiratory effort, no problems with respiration noted  Abdomen: Soft, gravid, appropriate for gestational age. Pain/Pressure: Absent     Pelvic:  Cervical exam deferred        Extremities: Normal range of motion.     ental Status: Normal mood and affect. Normal behavior. Normal judgment and thought content.   [place] Ob Comp + 14 Wk  Result Date: 08/18/2018 ULTRASOUND REPORT Patient Name: Terry BaltimoreBrittany N Zhang DOB: 1991-05-20 MRN: [id number] Location: [facility] Date of Service: 08/18/2018  Indications:Anatomy Ultrasound Findings: Mason JimSingleton intrauterine pregnancy is visualized with FHR at 148 BPM. Biometrics give an (U/S) Gestational age of 7339w1d and an (U/S) EDD of 01/04/2019; this correlates with the clinically established Estimated Date of Delivery: 01/04/19 Fetal presentation is Cephalic. EFW: 13 oz/ 372g. Placenta: Anterior, grade 0. AFI: subjectively normal. Anatomic survey is incomplete for cardiac and posterior fossa of brain due to fetal position and maternal body habitus and normal; Gender - female.  Right Ovary is normal in appearance. Left Ovary is normal appearance. Survey of the adnexa demonstrates no adnexal masses. There is no free peritoneal fluid in the cul de sac. Impression: 1. 6239w1d Viable Singleton Intrauterine pregnancy by U/S. 2. (U/S) EDD is consistent with Clinically established Estimated Date of Delivery: 01/04/19 . 3. Normal Anatomy Scan Recommendations: 1.Clinical correlation with the patient's History and Physical Exam.  There is a singleton gestation with subjectively normal amniotic fluid volume. The fetal biometry correlates with established dating. Detailed evaluation of the fetal anatomy was performed.The fetal anatomical survey appears within normal limits within the resolution of ultrasound as described above.  Posterior fossa views were suboptimally imaged.  Cervical length remains stable at 4.63cm.  It must be noted that a normal ultrasound is unable to rule out fetal aneuploidy.  Vena AustriaAndreas Hinata Diener, MD, Merlinda FrederickFACOG [facility], [facility] 08/18/2018, 5:08 PM Vena AustriaAndreas Waleed Dettman, MD, Merlinda FrederickFACOG [facility], [facility] 08/18/2018, 5:08 PM Willette AlmaKristen Priestley, RDMS, RVT  [place] Ob Transvaginal  Result Date: 08/04/2018 ULTRASOUND REPORT Location: [facility] Date of Service: 08/04/2018 Indications:Previous preterm delivery Findings: Mason JimSingleton intrauterine pregnancy is visualized. FHR: 143 bpm Stomach:  visualized Kidneys: visualized Bladder: visualized  Transvaginal cervical length performed. Cervical length measures  4.53 cm in the shortest dimension. There is no change with fundal pressure. No  funneling is present. Impression: 1. [redacted]w[redacted]d viable Singleton Intrauterine pregnancy by previously established criteria. 2. Cervical length is 4.53 cm. Recommendations: 1.Clinical correlation with the patient's History and Physical Exam. Mital bahen P Patel, RDMS Normal cervical length ultrasound. Vena Austria, MD, [facility], [facility] 08/04/2018, 2:37 PM   US Ob Transvaginal  Result Date: 07/21/2018 ULTRASOUND REPORT Patient Name: Terry Zhang DOB: [birth year] MRN: [id number] Location: [facility] Date of Service: 07/21/2018 Indications:Previous preterm delivery Findings: Mason Jim intrauterine pregnancy is visualized. FHR: 141 bpm Stomach:  visualized Kidneys: visualized Bladder: visualized Transvaginal cervical length performed. Cervical length measures  4.12 cm in the shortest dimension. There is no change with fundal pressure. No funneling is present. Impression: 1. [redacted]w[redacted]d viable Singleton Intrauterine pregnancy by previously established criteria. 2. Cervical length is 4.12 cm. Recommendations: 1.Clinical correlation with the patient's History and Physical Exam. Willette Alma, RDMS, RVT Normal 16 week cervical length on scan today without evidence of dynamic changes or funneling. Vena Austria, MD, Evern Core [facility], [facility] 07/21/2018, 2:17 PM     Assessment   27 y.o. G2P0101 at [redacted]w[redacted]d by  01/04/2019, by Ultrasound presenting for routine prenatal visit  Plan   pregnancy 2  Problems (from 03/14/18 to present)    Problem Noted Resolved   Obesity in pregnancy 05/12/2018 by Farrel Conners, CNM No   Overview Addendum 07/21/2018  2:56 PM by Vena Austria, MD    BMI >=40 [X]  early 1h gtt - 93 [ ]  bASA (>12 weeks) [ ]  Growth u/s 28 [ ] , 32 [ ] , 36 weeks [ ]  [ ]  NST/AFI weekly 36+ weeks  (36[] , 37[] , 38[] , 39[] , 40[] ) [ ]  IOL by 41 weeks (scheduled, prn [] )       History of preterm delivery, currently pregnant 05/12/2018 by Farrel Conners, CNM No   Overview Addendum 07/21/2018  2:52 PM by Vena Austria, MD    Spontaneous labor and delivery of G1. Offered 17 P Baseline cervical length 4.12cm 07/21/2018      Supervision of high risk pregnancy, antepartum 05/06/2018 by Nadara Mustard, MD No   Overview Addendum 08/18/2018  5:09 PM by Vena Austria, MD    Clinic [facility] Prenatal Labs  Dating 6wk1d ultrasound Blood type: A/Positive/-- (05/08 1452)   Genetic Screen NIPS: Normal XX, Inheritest negative Antibody:Negative (05/08 1452)  Anatomic [place] Incomplete posterior fossa views [ ]  24 week follow up Rubella: 1.36 (05/08 1452) Varicella: immune  GTT Early:93 Third trimester:  RPR: Non Reactive (05/08 1452)   Rhogam N/A HBsAg: Negative (05/08 1452)   TDaP vaccine                       Flu Shot: HIV: Non Reactive (05/08 1452)   Baby Food                                GBS:   Contraception  Pap: 05/06/2017 NIL  CBB   Pelvis tested to 6lbs 5oz  CS/VBAC    Support Person            Anxiety and depression 08/03/2015 by Doreene Nest, NP No   Overview Signed 07/21/2018  2:55 PM by Vena Austria, MD    Zoloft Rx 50mg  daily 07/21/2018      Tachycardia 07/08/2018 by Conard Novak, MD 07/21/2018 by Vena Austria, MD       Gestational age appropriate obstetric precautions  including but not limited to vaginal bleeding, contractions, leaking of fluid and fetal movement were reviewed in detail with the patient.   - anatomy scan incomplete posterior fossa follow up in 4 weeks   Return in about 4 weeks (around 09/15/2018) for ROB and follow up anatomy scan.  Vena AustriaAndreas Alynn Ellithorpe, MD, Merlinda FrederickFACOG [facility], [facility] 08/18/2018, 8:46 PM

## 2018-09-15 ENCOUNTER — Ambulatory Visit (INDEPENDENT_AMBULATORY_CARE_PROVIDER_SITE_OTHER): Payer: Managed Care, Other (non HMO) | Admitting: Obstetrics and Gynecology

## 2018-09-15 ENCOUNTER — Ambulatory Visit (INDEPENDENT_AMBULATORY_CARE_PROVIDER_SITE_OTHER): Payer: Managed Care, Other (non HMO)

## 2018-09-15 VITALS — BP 118/82 | Wt 298.0 lb

## 2018-09-15 DIAGNOSIS — O09899 Supervision of other high risk pregnancies, unspecified trimester: Secondary | ICD-10-CM

## 2018-09-15 DIAGNOSIS — Z3A24 24 weeks gestation of pregnancy: Secondary | ICD-10-CM | POA: Diagnosis not present

## 2018-09-15 DIAGNOSIS — Z0489 Encounter for examination and observation for other specified reasons: Secondary | ICD-10-CM

## 2018-09-15 DIAGNOSIS — O0990 Supervision of high risk pregnancy, unspecified, unspecified trimester: Secondary | ICD-10-CM

## 2018-09-15 DIAGNOSIS — O99212 Obesity complicating pregnancy, second trimester: Secondary | ICD-10-CM

## 2018-09-15 DIAGNOSIS — IMO0002 Reserved for concepts with insufficient information to code with codable children: Secondary | ICD-10-CM

## 2018-09-15 DIAGNOSIS — O99210 Obesity complicating pregnancy, unspecified trimester: Secondary | ICD-10-CM

## 2018-09-15 DIAGNOSIS — Z362 Encounter for other antenatal screening follow-up: Secondary | ICD-10-CM

## 2018-09-15 DIAGNOSIS — O09212 Supervision of pregnancy with history of pre-term labor, second trimester: Secondary | ICD-10-CM

## 2018-09-15 DIAGNOSIS — O09219 Supervision of pregnancy with history of pre-term labor, unspecified trimester: Secondary | ICD-10-CM

## 2018-09-15 NOTE — Progress Notes (Signed)
ROB Follow up anatomy scan/ It is a GIRL!

## 2018-09-15 NOTE — Progress Notes (Signed)
Routine Prenatal Care Visit  Subjective  Terry Zhang is a 27 y.o. G2P0101 at [redacted]w[redacted]d being seen today for ongoing prenatal care.  She is currently monitored for the following issues for this high-risk pregnancy and has Anxiety and depression; Hyperlipidemia; Supervision of high risk pregnancy, antepartum; Obesity in pregnancy; History of preterm delivery, currently pregnant; and Paroxysmal tachycardia (HCC) on their problem list.  ----------------------------------------------------------------------------------- Patient reports no complaints.   Contractions: Not present. Vag. Bleeding: None.  Movement: Present. Denies leaking of fluid.  ----------------------------------------------------------------------------------- The following portions of the patient's history were reviewed and updated as appropriate: allergies, current medications, past family history, past medical history, past social history, past surgical history and problem list. Problem list updated.   Objective  Blood pressure 118/82, weight 298 lb (135.2 kg), last menstrual period 03/14/2018. Pregravid weight 272 lb (123.4 kg) Total Weight Gain 26 lb (11.8 kg) Urinalysis:      Fetal Status: Fetal Heart Rate (bpm): 140 Fundal Height: 28 cm Movement: Present     General:  Alert, oriented and cooperative. Patient is in no acute distress.  Skin: Skin is warm and dry. No rash noted.   Cardiovascular: Normal heart rate noted  Respiratory: Normal respiratory effort, no problems with respiration noted  Abdomen: Soft, gravid, appropriate for gestational age. Pain/Pressure: Absent     Pelvic:  Cervical exam deferred        Extremities: Normal range of motion.     ental Status: Normal mood and affect. Normal behavior. Normal judgment and thought content.   US Ob Comp + 14 Wk  Result Date: 08/18/2018 ULTRASOUND REPORT Patient Name: Terry Zhang DOB: February 16, 1991 MRN: [id number] Location: [facility] Date of Service:  08/18/2018 Indications:Anatomy Ultrasound Findings: Mason Jim intrauterine pregnancy is visualized with FHR at 148 BPM. Biometrics give an (U/S) Gestational age of [redacted]w[redacted]d and an (U/S) EDD of 01/04/2019; this correlates with the clinically established Estimated Date of Delivery: 01/04/19 Fetal presentation is Cephalic. EFW: 13 oz/ 372g. Placenta: Anterior, grade 0. AFI: subjectively normal. Anatomic survey is incomplete for cardiac and posterior fossa of brain due to fetal position and maternal body habitus and normal; Gender - female.  Right Ovary is normal in appearance. Left Ovary is normal appearance. Survey of the adnexa demonstrates no adnexal masses. There is no free peritoneal fluid in the cul de sac. Impression: 1. [redacted]w[redacted]d Viable Singleton Intrauterine pregnancy by U/S. 2. (U/S) EDD is consistent with Clinically established Estimated Date of Delivery: 01/04/19 . 3. Normal Anatomy Scan Recommendations: 1.Clinical correlation with the patient's History and Physical Exam.  There is a singleton gestation with subjectively normal amniotic fluid volume. The fetal biometry correlates with established dating. Detailed evaluation of the fetal anatomy was performed.The fetal anatomical survey appears within normal limits within the resolution of ultrasound as described above.  Posterior fossa views were suboptimally imaged.  Cervical length remains stable at 4.63cm.  It must be noted that a normal ultrasound is unable to rule out fetal aneuploidy.  Vena Austria, MD, [facility], [facility] 08/18/2018, 5:08 PM Vena Austria, MD, [facility], [facility] 08/18/2018, 5:08 PM Willette Alma, RDMS, RVT  US Ob Follow Up  Result Date: 09/15/2018 Patient Name: Terry Zhang DOB: 07/07/1991 MRN: [id number] ULTRASOUND REPORT Location: [facility] Date of Service: 09/15/2018 Indications: Anatomy follow up ultrasound Findings: Mason Jim intrauterine pregnancy is  visualized with FHR at 138 BPM. Fetal presentation is Breech. Placenta: fundal, anterior. Grade: 1 AFI: subjectively normal. Anatomic  survey is complete, for the posterior fossa of the brain,RVOT, LVOT. But still suboptimal for the [facility] cardiac view d/t fetal position and maternal body habitus There is no free peritoneal fluid in the cul de sac. Impression: 1. 5523w1d Viable Singleton Intrauterine pregnancy previously established criteria. 2. Normal Anatomy Scan is now complete  for the posterior fossa of the brain,RVOT, LVOT. But still suboptimal for the [facility] cardiac view d/t fetal position and maternal body habitus Recommendations: 1.Clinical correlation with the patient's History and Physical Exam. Abeer Alsammarraie,RDMS There is a singleton gestation with subjectively normal amniotic fluid volume.  Limited evaluation of the fetal anatomy was performed today, focusing on on anatomic structures not fully visualized at the time of prior study.The visualized fetal anatomical survey appears within normal limits within the resolution of ultrasound as described above, and the anatomic survey is remains incomplete for 4 chamber heart.  It must be noted that a normal ultrasound is unable to rule out fetal aneuploidy.  Vena AustriaAndreas Johonna Binette, MD, Evern CoreFACOG [facility], [facility] 09/15/2018, 5:03 PM    Assessment   27 y.o. G2P0101 at 2023w1d by  01/04/2019, by Ultrasound presenting for routine prenatal visit  Plan   pregnancy 2  Problems (from 03/14/18 to present)    Problem Noted Resolved   Obesity in pregnancy 05/12/2018 by Farrel ConnersGutierrez, Colleen, CNM No   Overview Addendum 07/21/2018  2:56 PM by Vena AustriaStaebler, Shontia Gillooly, MD    BMI >=40 [X]  early 1h gtt - 93 [ ]  bASA (>12 weeks) [ ]  Growth u/s 28 [ ] , 32 [ ] , 36 weeks [ ]  [ ]  NST/AFI weekly 36+ weeks (36[] , 37[] , 38[] , 39[] , 40[] ) [ ]  IOL by 41 weeks (scheduled, prn [] )       History of preterm delivery, currently pregnant 05/12/2018 by Farrel ConnersGutierrez, Colleen, CNM  No   Overview Addendum 07/21/2018  2:52 PM by Vena AustriaStaebler, Trinidee Schrag, MD    Spontaneous labor and delivery of G1. Offered 17 P Baseline cervical length 4.12cm 07/21/2018      Supervision of high risk pregnancy, antepartum 05/06/2018 by Nadara MustardHarris, Robert P, MD No   Overview Addendum 09/15/2018  4:24 PM by Vena AustriaStaebler, Shann Merrick, MD    Clinic [facility] Prenatal Labs  Dating 6wk1d ultrasound Blood type: A/Positive/-- (05/08 1452)   Genetic Screen NIPS: Normal XX, Inheritest negative Antibody:Negative (05/08 1452)  Anatomic US Incomplete posterior fossa views [X]  24 week follow up suboptimal 4 chamber  Rubella: 1.36 (05/08 1452) Varicella: immune  GTT Early:93 Third trimester:  RPR: Non Reactive (05/08 1452)   Rhogam N/A HBsAg: Negative (05/08 1452)   TDaP vaccine                       Flu Shot: HIV: Non Reactive (05/08 1452)   Baby Food                                GBS:   Contraception  Pap: 05/06/2017 NIL  CBB   Pelvis tested to 6lbs 5oz  CS/VBAC N/A   Support Person            Anxiety and depression 08/03/2015 by Doreene Nestlark, Katherine K, NP No   Overview Signed 07/21/2018  2:55 PM by Vena AustriaStaebler, Saide Lanuza, MD    Zoloft Rx 50mg  daily 07/21/2018      Tachycardia 07/08/2018 by Conard NovakJackson, Stephen D, MD 07/21/2018 by Vena AustriaStaebler, Eydan Chianese, MD       Gestational age appropriate  obstetric precautions including but not limited to vaginal bleeding, contractions, leaking of fluid and fetal movement were reviewed in detail with the patient.   - did bring up that she had significant problems with hypotension with her last epidural and required multiple pushes of ephedrine.  Will arrange anesthesia consult - inquired about primary Cesarean discussed risk for wound complications and recommended against.   - Growth scan 28 week labs next visit. Also follow up 4 chamber heart views.  Return in about 4 weeks (around 10/13/2018) for ROB, 28 week labs, and growth scan.  Vena Austria, MD, Evern Core [facility], [facility] 09/15/2018, 5:02 PM

## 2018-09-21 ENCOUNTER — Telehealth: Payer: Self-pay | Admitting: Obstetrics and Gynecology

## 2018-09-21 NOTE — Telephone Encounter (Signed)
Patient is aware of anesthesia consult on 10/13/18 @ 10:30am, [facility]

## 2018-09-21 NOTE — Telephone Encounter (Signed)
-----   Message from Vena AustriaAndreas Staebler, MD sent at 09/17/2018  9:29 AM EDT ----- Regarding: RE: Anesthesia consults What is the order under I placed on but it say inpatient consult there is no ambulatory anesthesia consult  ----- Message ----- From: Lewis Moccasinohen, Nancy F Sent: 09/16/2018   9:32 AM EDT To: Vena AustriaAndreas Staebler, MD Subject: RE: Anesthesia consults                        Referral needed. Thanks. ----- Message ----- From: Vena AustriaStaebler, Andreas, MD Sent: 09/15/2018   5:05 PM EDT To: Lewis MoccasinNancy F Cohen Subject: Anesthesia consults                            Needs preop consult prior epidural complications

## 2018-10-13 ENCOUNTER — Ambulatory Visit (INDEPENDENT_AMBULATORY_CARE_PROVIDER_SITE_OTHER): Payer: Managed Care, Other (non HMO)

## 2018-10-13 ENCOUNTER — Other Ambulatory Visit: Payer: Managed Care, Other (non HMO)

## 2018-10-13 ENCOUNTER — Inpatient Hospital Stay: Admission: RE | Admit: 2018-10-13 | Payer: Managed Care, Other (non HMO) | Source: Ambulatory Visit

## 2018-10-13 ENCOUNTER — Ambulatory Visit (INDEPENDENT_AMBULATORY_CARE_PROVIDER_SITE_OTHER): Payer: Managed Care, Other (non HMO) | Admitting: Obstetrics and Gynecology

## 2018-10-13 VITALS — BP 128/78 | Wt 293.0 lb

## 2018-10-13 DIAGNOSIS — Z362 Encounter for other antenatal screening follow-up: Secondary | ICD-10-CM | POA: Diagnosis not present

## 2018-10-13 DIAGNOSIS — Z3A28 28 weeks gestation of pregnancy: Secondary | ICD-10-CM

## 2018-10-13 DIAGNOSIS — O0990 Supervision of high risk pregnancy, unspecified, unspecified trimester: Secondary | ICD-10-CM

## 2018-10-13 DIAGNOSIS — Z0489 Encounter for examination and observation for other specified reasons: Secondary | ICD-10-CM

## 2018-10-13 DIAGNOSIS — O09219 Supervision of pregnancy with history of pre-term labor, unspecified trimester: Secondary | ICD-10-CM

## 2018-10-13 DIAGNOSIS — O99210 Obesity complicating pregnancy, unspecified trimester: Secondary | ICD-10-CM

## 2018-10-13 DIAGNOSIS — Z23 Encounter for immunization: Secondary | ICD-10-CM | POA: Diagnosis not present

## 2018-10-13 DIAGNOSIS — IMO0002 Reserved for concepts with insufficient information to code with codable children: Secondary | ICD-10-CM

## 2018-10-13 DIAGNOSIS — O09213 Supervision of pregnancy with history of pre-term labor, third trimester: Secondary | ICD-10-CM

## 2018-10-13 DIAGNOSIS — O99213 Obesity complicating pregnancy, third trimester: Secondary | ICD-10-CM

## 2018-10-13 DIAGNOSIS — F418 Other specified anxiety disorders: Secondary | ICD-10-CM

## 2018-10-13 DIAGNOSIS — O99343 Other mental disorders complicating pregnancy, third trimester: Secondary | ICD-10-CM

## 2018-10-13 DIAGNOSIS — O09899 Supervision of other high risk pregnancies, unspecified trimester: Secondary | ICD-10-CM

## 2018-10-13 LAB — POCT URINALYSIS DIPSTICK OB
GLUCOSE, UA: NEGATIVE
POC,PROTEIN,UA: NEGATIVE

## 2018-10-13 NOTE — Progress Notes (Signed)
ROB Growth scan  28 week labs Flu vaccine today

## 2018-10-14 LAB — 28 WEEK RH+PANEL
BASOS: 0 %
Basophils Absolute: 0 10*3/uL (ref 0.0–0.2)
EOS (ABSOLUTE): 0.1 10*3/uL (ref 0.0–0.4)
EOS: 1 %
Gestational Diabetes Screen: 114 mg/dL (ref 65–139)
HIV SCREEN 4TH GENERATION: NONREACTIVE
Hematocrit: 35.1 % (ref 34.0–46.6)
Hemoglobin: 11.9 g/dL (ref 11.1–15.9)
IMMATURE GRANS (ABS): 0 10*3/uL (ref 0.0–0.1)
IMMATURE GRANULOCYTES: 1 %
LYMPHS: 27 %
Lymphocytes Absolute: 2.4 10*3/uL (ref 0.7–3.1)
MCH: 28.7 pg (ref 26.6–33.0)
MCHC: 33.9 g/dL (ref 31.5–35.7)
MCV: 85 fL (ref 79–97)
Monocytes Absolute: 0.6 10*3/uL (ref 0.1–0.9)
Monocytes: 6 %
NEUTROS PCT: 65 %
Neutrophils Absolute: 5.8 10*3/uL (ref 1.4–7.0)
Platelets: 221 10*3/uL (ref 150–450)
RBC: 4.14 x10E6/uL (ref 3.77–5.28)
RDW: 14.1 % (ref 12.3–15.4)
RPR Ser Ql: NONREACTIVE
WBC: 8.9 10*3/uL (ref 3.4–10.8)

## 2018-10-15 NOTE — Progress Notes (Signed)
Routine Prenatal Care Visit  Subjective  Terry Zhang is a 27 y.o. G2P0101 at [redacted]w[redacted]d being seen today for ongoing prenatal care.  She is currently monitored for the following issues for this high-risk pregnancy and has Anxiety and depression; Hyperlipidemia; Supervision of high risk pregnancy, antepartum; Obesity in pregnancy; History of preterm delivery, currently pregnant; and Paroxysmal tachycardia (HCC) on their problem list.  ----------------------------------------------------------------------------------- Patient reports no complaints.   Contractions: Not present. Vag. Bleeding: None.  Movement: Present. Denies leaking of fluid.  ----------------------------------------------------------------------------------- The following portions of the patient's history were reviewed and updated as appropriate: allergies, current medications, past family history, past medical history, past social history, past surgical history and problem list. Problem list updated.   Objective  Blood pressure 128/78, weight 293 lb (132.9 kg), last menstrual period 03/14/2018. Pregravid weight 272 lb (123.4 kg) Total Weight Gain 21 lb (9.526 kg) Urinalysis:      Fetal Status: Fetal Heart Rate (bpm): 145 Fundal Height: 30 cm Movement: Present     General:  Alert, oriented and cooperative. Patient is in no acute distress.  Skin: Skin is warm and dry. No rash noted.   Cardiovascular: Normal heart rate noted  Respiratory: Normal respiratory effort, no problems with respiration noted  Abdomen: Soft, gravid, appropriate for gestational age. Pain/Pressure: Absent     Pelvic:  Cervical exam deferred        Extremities: Normal range of motion.     ental Status: Normal mood and affect. Normal behavior. Normal judgment and thought content.   US Ob Follow Up  Result Date: 10/13/2018 Patient Name: Terry Zhang DOB: [birth year] MRN: [id number] ULTRASOUND REPORT Location: [facility] Date of Service:  10/13/2018 ULTRASOUND REPORT Location: [facility] Date of Service: 10/13/2018 Indications: Growth and follow up anatomy. Findings: Singleton intrauterine pregnancy is visualized with FHR at 130 BPM. Biometrics give an (U/S) Gestational age of [redacted]w[redacted]d and an (U/S) EDD of 12/23/2018; this correlates with the clinically established Estimated Date of Delivery: 01/04/19. Fetal presentation is Cephalic. Placenta: anterior. Grade: 1 Anatomic survey is now complete for 4 chamber heart. Growth percentile is 78.4%. AC is measuring 97.4%ile EFW: 1,483g (3lbs 4oz) There is no free peritoneal fluid in the cul de sac. Impression: 1. [redacted]w[redacted]d Viable Singleton Intrauterine pregnancy previously established criteria. 2. Growth is 78.4 %ile.  AC is measuring 97.4%ile. 3. Normal Anatomy Scan is now complete Recommendations: 1.Clinical correlation with the patient's History and Physical Exam. Darlina Guys, RDMS RVT There is a singleton gestation with normal amniotic fluid volume. The fetal biometry correlates with established dating.  The does appear to be accelerated growth in the abdominal conference.  28 week labs and Glucola obtained today.  Limited fetal anatomy was performed.The visualized fetal anatomical survey appears within normal limits within the resolution of ultrasound as described above, the missing 4 chamber views have now been adequately visualized.  It must be noted that a normal ultrasound is unable to rule out fetal aneuploidy.  Vena Austria, MD, [facility], [facility] 10/13/2018, 9:29 AM   US Ob Follow Up  Result Date: 09/15/2018 Patient Name: Terry Zhang DOB: Nov 28, 1991 MRN: [id number] ULTRASOUND REPORT Location: [facility] Date of Service: 09/15/2018 Indications: Anatomy follow up ultrasound Findings: Mason Jim intrauterine pregnancy is visualized with FHR at 138 BPM. Fetal presentation is Breech. Placenta: fundal, anterior. Grade: 1 AFI: subjectively normal. Anatomic  survey is complete, for the posterior fossa of the brain,RVOT, LVOT. But still suboptimal for the [facility]  cardiac view d/t fetal position and maternal body habitus There is no free peritoneal fluid in the cul de sac. Impression: 1. [redacted]w[redacted]d Viable Singleton Intrauterine pregnancy previously established criteria. 2. Normal Anatomy Scan is now complete  for the posterior fossa of the brain,RVOT, LVOT. But still suboptimal for the [facility] cardiac view d/t fetal position and maternal body habitus Recommendations: 1.Clinical correlation with the patient's History and Physical Exam. Abeer Alsammarraie,RDMS There is a singleton gestation with subjectively normal amniotic fluid volume.  Limited evaluation of the fetal anatomy was performed today, focusing on on anatomic structures not fully visualized at the time of prior study.The visualized fetal anatomical survey appears within normal limits within the resolution of ultrasound as described above, and the anatomic survey is remains incomplete for 4 chamber heart.  It must be noted that a normal ultrasound is unable to rule out fetal aneuploidy.  Vena Austria, MD, Evern Core [facility], [facility] 09/15/2018, 5:03 PM   Immunization History  Administered Date(s) Administered  . Influenza,inj,Quad PF,6+ Mos 10/13/2018  . Influenza-Unspecified 10/31/2015    Assessment   27 y.o. G2P0101 at [redacted]w[redacted]d by  01/04/2019, by Ultrasound presenting for routine prenatal visit  Plan   pregnancy 2  Problems (from 03/14/18 to present)    Problem Noted Resolved   Obesity in pregnancy 05/12/2018 by Farrel Conners, CNM No   Overview Addendum 07/21/2018  2:56 PM by Vena Austria, MD    BMI >=40 [X]  early 1h gtt - 93 [ ]  bASA (>12 weeks) [ ]  Growth u/s 28 [ ] , 32 [ ] , 36 weeks [ ]  [ ]  NST/AFI weekly 36+ weeks (36[] , 37[] , 38[] , 39[] , 40[] ) [ ]  IOL by 41 weeks (scheduled, prn [] )       History of preterm delivery, currently pregnant 05/12/2018 by Farrel Conners, CNM No   Overview Addendum 07/21/2018  2:52 PM by Vena Austria, MD    Spontaneous labor and delivery of G1. Offered 17 P Baseline cervical length 4.12cm 07/21/2018      Supervision of high risk pregnancy, antepartum 05/06/2018 by Nadara Mustard, MD No   Overview Addendum 10/14/2018 12:29 PM by Vena Austria, MD    Clinic [facility] Prenatal Labs  Dating 6wk1d ultrasound Blood type: A/Positive/-- (05/08 1452)   Genetic Screen NIPS: Normal XX, Inheritest negative Antibody:Negative (05/08 1452)  Anatomic [place] Incomplete posterior fossa views [X]  24 week follow up suboptimal 4 chamber  Rubella: 1.36 (05/08 1452) Varicella: immune  GTT Early:93 Third trimester: 114 RPR: Non Reactive (05/08 1452)   Rhogam N/A HBsAg: Negative (05/08 1452)   TDaP vaccine                       Flu Shot: HIV: Non Reactive (05/08 1452)   Baby Food                                GBS:   Contraception  Pap: 05/06/2017 NIL  CBB   Pelvis tested to 6lbs 5oz  CS/VBAC N/A   Support Person            Anxiety and depression 08/03/2015 by Doreene Nest, NP No   Overview Signed 07/21/2018  2:55 PM by Vena Austria, MD    Zoloft Rx 50mg  daily 07/21/2018      Tachycardia 07/08/2018 by Conard Novak, MD 07/21/2018 by Vena Austria, MD       Gestational age appropriate  obstetric precautions including but not limited to vaginal bleeding, contractions, leaking of fluid and fetal movement were reviewed in detail with the patient.  - Growth 78%ile with accelerated AC growth - 28 week labs today  - Discussed benefits of influenza vaccination during pregnancy.  The CDC recommends influenza vaccination for all pregnant patient regardless of trimester.  Besides the benefits of preventing influenza in the mother we discussed transfer of maternal antibodies to fetus as well as secretion of antibodies in breast milk which are protective for the infant.    In case of positive influenza test in the patient or a  close household contact, the use of Tamiflu is also recommended by the CDC.     Return in about 2 weeks (around 10/27/2018) for ROB.  Vena Austria, MD, [facility], [facility]

## 2018-10-21 ENCOUNTER — Inpatient Hospital Stay: Admission: RE | Admit: 2018-10-21 | Payer: Managed Care, Other (non HMO) | Source: Ambulatory Visit

## 2018-10-21 ENCOUNTER — Other Ambulatory Visit: Payer: Managed Care, Other (non HMO)

## 2018-10-27 ENCOUNTER — Ambulatory Visit (INDEPENDENT_AMBULATORY_CARE_PROVIDER_SITE_OTHER): Payer: Managed Care, Other (non HMO) | Admitting: Obstetrics and Gynecology

## 2018-10-27 ENCOUNTER — Encounter: Payer: Self-pay | Admitting: Obstetrics and Gynecology

## 2018-10-27 VITALS — BP 130/78 | Wt 303.0 lb

## 2018-10-27 DIAGNOSIS — Z3A30 30 weeks gestation of pregnancy: Secondary | ICD-10-CM

## 2018-10-27 DIAGNOSIS — Z23 Encounter for immunization: Secondary | ICD-10-CM | POA: Diagnosis not present

## 2018-10-27 DIAGNOSIS — O09213 Supervision of pregnancy with history of pre-term labor, third trimester: Secondary | ICD-10-CM

## 2018-10-27 DIAGNOSIS — O09899 Supervision of other high risk pregnancies, unspecified trimester: Secondary | ICD-10-CM

## 2018-10-27 DIAGNOSIS — O99213 Obesity complicating pregnancy, third trimester: Secondary | ICD-10-CM

## 2018-10-27 DIAGNOSIS — O09219 Supervision of pregnancy with history of pre-term labor, unspecified trimester: Secondary | ICD-10-CM

## 2018-10-27 DIAGNOSIS — O99210 Obesity complicating pregnancy, unspecified trimester: Secondary | ICD-10-CM

## 2018-10-27 DIAGNOSIS — O0990 Supervision of high risk pregnancy, unspecified, unspecified trimester: Secondary | ICD-10-CM

## 2018-10-27 LAB — POCT URINALYSIS DIPSTICK OB: GLUCOSE, UA: NEGATIVE

## 2018-10-27 NOTE — Progress Notes (Signed)
ROB/Tdap C/o not sleeping much

## 2018-10-27 NOTE — Progress Notes (Signed)
Routine Prenatal Care Visit  Subjective  Terry Zhang is a 27 y.o. G2P0101 at [redacted]w[redacted]d being seen today for ongoing prenatal care.  She is currently monitored for the following issues for this high-risk pregnancy and has Anxiety and depression; Hyperlipidemia; Supervision of high risk pregnancy, antepartum; Obesity in pregnancy; History of preterm delivery, currently pregnant; and Paroxysmal tachycardia (HCC) on their problem list.  ----------------------------------------------------------------------------------- Patient reports no complaints.   Contractions: Not present. Vag. Bleeding: None.  Movement: Present. Denies leaking of fluid.  ----------------------------------------------------------------------------------- The following portions of the patient's history were reviewed and updated as appropriate: allergies, current medications, past family history, past medical history, past social history, past surgical history and problem list. Problem list updated.   Objective  Blood pressure 130/78, weight (!) 303 lb (137.4 kg), last menstrual period 03/14/2018. Pregravid weight 272 lb (123.4 kg) Total Weight Gain 31 lb (14.1 kg) Urinalysis:      Fetal Status: Fetal Heart Rate (bpm): 120   Movement: Present     General:  Alert, oriented and cooperative. Patient is in no acute distress.  Skin: Skin is warm and dry. No rash noted.   Cardiovascular: Normal heart rate noted  Respiratory: Normal respiratory effort, no problems with respiration noted  Abdomen: Soft, gravid, appropriate for gestational age. Pain/Pressure: Present     Pelvic:  Cervical exam deferred        Extremities: Normal range of motion.     Mental Status: Normal mood and affect. Normal behavior. Normal judgment and thought content.     Assessment   27 y.o. G2P0101 at [redacted]w[redacted]d by  01/04/2019, by Ultrasound presenting for routine prenatal visit  Plan   pregnancy 2  Problems (from 03/14/18 to present)    Problem  Noted Resolved   Obesity in pregnancy 05/12/2018 by Farrel Conners, CNM No   Overview Addendum 10/15/2018 11:05 AM by Vena Austria, MD    BMI >=40 [X]  early 1h gtt - 93 [X]  bASA (>12 weeks) [ ]  Growth u/s 28 [X] , 32 [ ] , 36 weeks [ ]   - 28 weeks growth 10/13/18 1483g (3lbs 4oz) c/w 78.4%ile with AC 97.4% [ ]  NST/AFI weekly 36+ weeks (36[] , 37[] , 38[] , 39[] , 40[] ) [ ]  IOL by 41 weeks (scheduled, prn [] )       History of preterm delivery, currently pregnant 05/12/2018 by Farrel Conners, CNM No   Overview Addendum 07/21/2018  2:52 PM by Vena Austria, MD    Spontaneous labor and delivery of G1. Offered 17 P Baseline cervical length 4.12cm 07/21/2018      Supervision of high risk pregnancy, antepartum 05/06/2018 by Nadara Mustard, MD No   Overview Addendum 10/27/2018  5:49 PM by Natale Milch, MD    Clinic [facility] Prenatal Labs  Dating 6wk1d ultrasound Blood type: A/Positive/-- (05/08 1452)   Genetic Screen NIPS: Normal XX, Inheritest negative Antibody:Negative (05/08 1452)  Anatomic [place] Incomplete posterior fossa views [X]  24 week follow up suboptimal 4 chamber [X]  complete at 28 week growth Rubella: 1.36 (05/08 1452) Varicella: immune  GTT Early:93 Third trimester: 114 RPR: Non Reactive (05/08 1452)   Rhogam N/A HBsAg: Negative (05/08 1452)   TDaP vaccine    10/27/18    Flu Shot: 10/13/18 HIV: Non Reactive (05/08 1452)   Baby Food   Breast                             GBS:   Contraception  vasectomy,  would like BC to bridge to vasectomy Pap: 05/06/2017 NIL  CBB   Pelvis tested to 6lbs 5oz  CS/VBAC N/A   Support Person            Anxiety and depression 08/03/2015 by Doreene Nest, NP No   Overview Signed 07/21/2018  2:55 PM by Vena Austria, MD    Zoloft Rx 50mg  daily 07/21/2018      Tachycardia 07/08/2018 by Conard Novak, MD 07/21/2018 by Vena Austria, MD       Gestational age appropriate obstetric precautions including but not limited to  vaginal bleeding, contractions, leaking of fluid and fetal movement were reviewed in detail with the patient.    Discussed that she has gained 30 lbs. Recommended maintaining weight at the same until delivery. She does not currently exercise, but she does like sports. 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 She like vegetables and believes that she eat healthy in appropriate portion sizes.   Given information on prenatal classes with [facility].  Given information on birthcontrol options postpartum. Recommended [email or web address].  Given information on volunteer doula program at the hospital.  Discussed breast feeding. Patient is planning on breast feeding. Patient was given resources and lactation consultation was advised.   Growth [place] at next visit to monitor growth.   Return in about 2 weeks (around 11/10/2018) for ROB and [place].  Natale Milch MD [facility], [facility] 10/27/2018, 5:49 PM

## 2018-11-03 ENCOUNTER — Ambulatory Visit (INDEPENDENT_AMBULATORY_CARE_PROVIDER_SITE_OTHER): Payer: Managed Care, Other (non HMO) | Admitting: Advanced Practice Midwife

## 2018-11-03 ENCOUNTER — Encounter: Payer: Self-pay | Admitting: Advanced Practice Midwife

## 2018-11-03 VITALS — BP 112/78 | Wt 302.0 lb

## 2018-11-03 DIAGNOSIS — R51 Headache: Secondary | ICD-10-CM

## 2018-11-03 DIAGNOSIS — Z3A31 31 weeks gestation of pregnancy: Secondary | ICD-10-CM

## 2018-11-03 DIAGNOSIS — O26893 Other specified pregnancy related conditions, third trimester: Secondary | ICD-10-CM

## 2018-11-03 LAB — POCT URINALYSIS DIPSTICK OB: Glucose, UA: NEGATIVE

## 2018-11-03 LAB — POCT URINALYSIS DIPSTICK
Bilirubin, UA: NEGATIVE
Blood, UA: NEGATIVE
Glucose, UA: NEGATIVE
LEUKOCYTES UA: NEGATIVE
NITRITE UA: NEGATIVE
PH UA: 5 (ref 5.0–8.0)
PROTEIN UA: POSITIVE — AB
SPEC GRAV UA: 1.025 (ref 1.010–1.025)
UROBILINOGEN UA: NEGATIVE U/dL — AB

## 2018-11-03 MED ORDER — BUTALBITAL-APAP-CAFFEINE 50-325-40 MG PO TABS: 1.0000 | ORAL_TABLET | Freq: Four times a day (QID) | ORAL | 0 refills | Status: DC | PRN

## 2018-11-03 NOTE — Patient Instructions (Signed)

## 2018-11-03 NOTE — Telephone Encounter (Signed)
Patient is schedule 11/03/18 with JEG. Please sign off. Thank you

## 2018-11-03 NOTE — Progress Notes (Signed)
Routine Prenatal Care Visit  Subjective  Terry Zhang is a 27 y.o. G2P0101 at [redacted]w[redacted]d being seen today for ongoing prenatal care.  She is currently monitored for the following issues for this high-risk pregnancy and has Anxiety and depression; Hyperlipidemia; Supervision of high risk pregnancy, antepartum; Obesity in pregnancy; History of preterm delivery, currently pregnant; and Paroxysmal tachycardia (HCC) on their problem list.  ----------------------------------------------------------------------------------- Patient reports headache for the past 2 weeks. She has tried tylenol but only 1 at a time without much relief. She had light sensitivity a couple of days ago but otherwise denies visual disturbance. She denies epigastric pain. She denies adequate hydration. She took her blood pressure at home and it was elevated 130s over 90s.  Encouraged increased hydration and OTC magnesium supplement. Gave patient rx for Fioricet.  Contractions: Not present. Vag. Bleeding: None.  Movement: Present. Denies leaking of fluid.  ----------------------------------------------------------------------------------- The following portions of the patient's history were reviewed and updated as appropriate: allergies, current medications, past family history, past medical history, past social history, past surgical history and problem list. Problem list updated.   Objective  Blood pressure 112/78, weight (!) 302 lb (137 kg), last menstrual period 03/14/2018. Pregravid weight 272 lb (123.4 kg) Total Weight Gain 30 lb (13.6 kg)   Urinalysis: Urine Protein Trace  Urine Glucose Negative Results for PHILA, SHOAF (MRN [id number]) as of 11/03/2018 15:42  Ref. Range 11/03/2018 15:12  Bilirubin, UA Unknown Negative  Clarity, UA Unknown clowdy  Color, UA Unknown dark yellow  Glucose Latest Ref Range: Negative  Negative  Ketones, UA Unknown 1+  Leukocytes, UA Latest Ref Range: Negative  Negative  Nitrite,  UA Unknown negative  pH, UA Latest Ref Range: 5.0 - 8.0  5.0  Protein, UA Latest Ref Range: Negative  Positive (A)  Specific Gravity, UA Latest Ref Range: 1.010 - 1.025  1.025  Urobilinogen, UA Latest Ref Range: 0.2 or 1.0 E.U./dL negative (A)  RBC, UA Unknown Negative    Fetal Status: Fetal Heart Rate (bpm): 129   Movement: Present     General:  Alert, oriented and cooperative. Patient is in no acute distress.  Skin: Skin is warm and dry. No rash noted.   Cardiovascular: Normal heart rate noted  Respiratory: Normal respiratory effort, no problems with respiration noted  Abdomen: Soft, gravid, appropriate for gestational age. Pain/Pressure: Absent     Pelvic:  Cervical exam deferred        Extremities: Normal range of motion.     Mental Status: Normal mood and affect. Normal behavior. Normal judgment and thought content.   Assessment   27 y.o. G2P0101 at [redacted]w[redacted]d by  01/04/2019, by Ultrasound presenting for work-in prenatal visit  Plan   pregnancy 2  Problems (from 03/14/18 to present)    Problem Noted Resolved   Obesity in pregnancy 05/12/2018 by Farrel Conners, CNM No   Overview Addendum 10/15/2018 11:05 AM by Vena Austria, MD    BMI >=40 [X]  early 1h gtt - 93 [X]  bASA (>12 weeks) [ ]  Growth u/s 28 [X] , 32 [ ] , 36 weeks [ ]   - 28 weeks growth 10/13/18 1483g (3lbs 4oz) c/w 78.4%ile with AC 97.4% [ ]  NST/AFI weekly 36+ weeks (36[] , 37[] , 38[] , 39[] , 40[] ) [ ]  IOL by 41 weeks (scheduled, prn [] )       History of preterm delivery, currently pregnant 05/12/2018 by Farrel Conners, CNM No   Overview Addendum 07/21/2018  2:52 PM by Vena Austria, MD    Spontaneous labor  and delivery of G1. Offered 17 P Baseline cervical length 4.12cm 07/21/2018      Supervision of high risk pregnancy, antepartum 05/06/2018 by Nadara Mustard, MD No   Overview Addendum 10/27/2018  5:49 PM by Natale Milch, MD    Clinic [facility] Prenatal Labs  Dating 6wk1d ultrasound Blood type:  A/Positive/-- (05/08 1452)   Genetic Screen NIPS: Normal XX, Inheritest negative Antibody:Negative (05/08 1452)  Anatomic [place] Incomplete posterior fossa views [X]  24 week follow up suboptimal 4 chamber [X]  complete at 28 week growth Rubella: 1.36 (05/08 1452) Varicella: immune  GTT Early:93 Third trimester: 114 RPR: Non Reactive (05/08 1452)   Rhogam N/A HBsAg: Negative (05/08 1452)   TDaP vaccine    10/27/18    Flu Shot: 10/13/18 HIV: Non Reactive (05/08 1452)   Baby Food   Breast                             GBS:   Contraception  vasectomy, would like BC to bridge to vasectomy Pap: 05/06/2017 NIL  CBB   Pelvis tested to 6lbs 5oz  CS/VBAC N/A   Support Person            Anxiety and depression 08/03/2015 by Doreene Nest, NP No   Overview Signed 07/21/2018  2:55 PM by Vena Austria, MD    Zoloft Rx 50mg  daily 07/21/2018      Tachycardia 07/08/2018 by Conard Novak, MD 07/21/2018 by Vena Austria, MD       Preterm labor symptoms and general obstetric precautions including but not limited to vaginal bleeding, contractions, leaking of fluid and fetal movement were reviewed in detail with the patient. Please refer to After Visit Summary for other counseling recommendations.   Normal blood pressure today. Return for worsening symptoms. Return for has follow up already scheduled.  Tresea Mall, CNM 11/03/2018 3:30 PM

## 2018-11-03 NOTE — Progress Notes (Signed)
ROB  BP check Migraine past week 2-3

## 2018-11-03 NOTE — Telephone Encounter (Signed)
ROB work in any provider next few days for BP check

## 2018-11-04 ENCOUNTER — Encounter
Admission: RE | Admit: 2018-11-04 | Discharge: 2018-11-04 | Disposition: A | Discharge status: Home / Self Care | Payer: Managed Care, Other (non HMO) | Source: Ambulatory Visit | Attending: Anesthesiology | Admitting: Anesthesiology

## 2018-11-04 NOTE — Consult Note (Signed)
[facility] Anesthesia Consultation  [name]:[id number] DOB: [birth year] DOA: 11/04/2018 PCP: Soundra Pilon, FNP   Requesting physician: Dr. Bonney Aid Date of consultation: 11/04/18 Reason for consultation: Obesity during pregnancy  CHIEF COMPLAINT:  Obesity during pregnancy  HISTORY OF PRESENT ILLNESS: Terry Zhang  is a 27 y.o. female with a known history of obesity in pregnancy. She had an epidural with her last labor ~9 yrs ago and reports that her blood pressure dropped within minutes of placement, she passed out and had to be given ephedrine to raise her blood pressure. She states that they then removed her epidural and she didn't have it for the remainder of labor. Denies any history of cardiac problems, denies hx of asthma, denies any personal or family hx of bleeding disorders.   PAST MEDICAL HISTORY:   Past Medical History:  Diagnosis Date  . Anxiety   . Depression   . Hyperlipidemia 02/23/2016  . Obesity     PAST SURGICAL HISTORY:  Past Surgical History:  Procedure Laterality Date  . ANKLE SURGERY    . APPENDECTOMY  2008  . BREAST BIOPSY  2003  . TONSILLECTOMY      SOCIAL HISTORY:  Social History   Tobacco Use  . Smoking status: Current Every Day Smoker    Packs/day: 0.25    Types: Cigarettes  . Smokeless tobacco: Never Used  Substance Use Topics  . Alcohol use: No    Alcohol/week: 0.0 standard drinks    FAMILY HISTORY:  Family History  Problem Relation Age of Onset  . Arthritis Mother   . Cancer Mother 53       cervical  . Hyperlipidemia Mother   . Mental illness Mother   . Diabetes Mother   . Hyperlipidemia Father   . Hypertension Father   . Mental illness Father   . Mental illness Maternal Grandfather   . Diabetes Maternal Grandfather   . Pancreatic cancer Maternal Grandmother 56    DRUG ALLERGIES: No Known Allergies  REVIEW OF SYSTEMS:   RESPIRATORY: No cough, shortness of breath,  wheezing.  CARDIOVASCULAR: No chest pain, orthopnea, edema.  HEMATOLOGY: No anemia, easy bruising or bleeding SKIN: No rash or lesion. NEUROLOGIC: No tingling, numbness, weakness.  PSYCHIATRY: No anxiety or depression.   MEDICATIONS AT HOME:  Prior to Admission medications   Medication Sig Start Date End Date Taking? Authorizing Provider  butalbital-acetaminophen-caffeine (FIORICET, ESGIC) 50-325-40 MG tablet Take 1-2 tablets by mouth every 6 (six) hours as needed for headache. 11/03/18 11/03/19  Tresea Mall, CNM  Prenatal Vit-Fe Fumarate-FA (MULTIVITAMIN-PRENATAL) 27-0.8 MG TABS tablet Take 1 tablet by mouth daily at 12 noon.    [provider]  sertraline (ZOLOFT) 50 MG tablet Take 1 tablet (50 mg total) by mouth daily. 08/18/18   Vena Austria, MD      PHYSICAL EXAMINATION:   VITAL SIGNS: Last menstrual period 03/14/2018.  GENERAL:  27 y.o.-year-old patient no acute distress.  HEENT: Head atraumatic, normocephalic. Oropharynx and nasopharynx clear. MP 2, TM distance >3 cm, normal mouth opening, grade 1 upper lip bite. LUNGS: Normal breath sounds bilaterally, no wheezing, rales,rhonchi. No use of accessory muscles of respiration.  CARDIOVASCULAR: S1, S2 normal. No murmurs, rubs, or gallops.  EXTREMITIES: No pedal edema, cyanosis, or clubbing.  NEUROLOGIC: normal gait PSYCHIATRIC: The patient is alert and oriented x 3.  SKIN: No obvious rash, lesion, or ulcer.    IMPRESSION AND PLAN:   Terry Zhang  is a 26 y.o. female presenting  with obesity during pregnancy. BMI is currently 46 at [redacted] weeks gestation.   We discussed analgesic options during labor including epidural analgesia. Discussed that in obesity there can be increased difficulty with epidural placement or even failure of successful epidural. We also discussed that even after successful epidural placement there is increased risk of catheter migration out of the epidural space that would require catheter  replacement. Discussed use of epidural vs spinal if need for nonemergent cesarean delivery. Also discussed GA in setting of emergent cesarean delivery. Discussed increased risk of difficult intubation during pregnancy should an emergency cesarean delivery be required.   Given hx of passing out and hypotension post epidural placement, would recommend starting with lower loading dose for epidural and gradually bolusing to achieve adequate block.   Should patient have weight gain that would put her BMI above 50, we discussed repeat evaluation at 35-36 weeks by anesthesia to determine whether there is a high risk of complications of anesthesia for which we would recommend transfer of OB care to a facility with a higher maternal level of care designation.

## 2018-11-09 ENCOUNTER — Ambulatory Visit (INDEPENDENT_AMBULATORY_CARE_PROVIDER_SITE_OTHER): Payer: Managed Care, Other (non HMO)

## 2018-11-09 ENCOUNTER — Ambulatory Visit (INDEPENDENT_AMBULATORY_CARE_PROVIDER_SITE_OTHER): Payer: Managed Care, Other (non HMO) | Admitting: Obstetrics and Gynecology

## 2018-11-09 VITALS — BP 130/70 | Wt 304.0 lb

## 2018-11-09 DIAGNOSIS — O0990 Supervision of high risk pregnancy, unspecified, unspecified trimester: Secondary | ICD-10-CM

## 2018-11-09 DIAGNOSIS — O09219 Supervision of pregnancy with history of pre-term labor, unspecified trimester: Secondary | ICD-10-CM

## 2018-11-09 DIAGNOSIS — Z362 Encounter for other antenatal screening follow-up: Secondary | ICD-10-CM

## 2018-11-09 DIAGNOSIS — O09213 Supervision of pregnancy with history of pre-term labor, third trimester: Secondary | ICD-10-CM

## 2018-11-09 DIAGNOSIS — O99213 Obesity complicating pregnancy, third trimester: Secondary | ICD-10-CM

## 2018-11-09 DIAGNOSIS — O09899 Supervision of other high risk pregnancies, unspecified trimester: Secondary | ICD-10-CM

## 2018-11-09 DIAGNOSIS — Z3A32 32 weeks gestation of pregnancy: Secondary | ICD-10-CM

## 2018-11-09 DIAGNOSIS — O99210 Obesity complicating pregnancy, unspecified trimester: Secondary | ICD-10-CM

## 2018-11-09 LAB — POCT URINALYSIS DIPSTICK OB: Glucose, UA: NEGATIVE

## 2018-11-09 NOTE — Progress Notes (Signed)
ROB [place] Lower body pain increasing

## 2018-11-09 NOTE — Progress Notes (Signed)
Routine Prenatal Care Visit  Subjective  Terry Zhang is a 27 y.o. G2P0101 at [redacted]w[redacted]d being seen today for ongoing prenatal care.  She is currently monitored for the following issues for this high-risk pregnancy and has Anxiety and depression; Hyperlipidemia; Supervision of high risk pregnancy, antepartum; Obesity in pregnancy; History of preterm delivery, currently pregnant; and Paroxysmal tachycardia (HCC) on their problem list.  ----------------------------------------------------------------------------------- Patient reports no complaints.   Contractions: Not present. Vag. Bleeding: None.  Movement: Present. Denies leaking of fluid.  ----------------------------------------------------------------------------------- The following portions of the patient's history were reviewed and updated as appropriate: allergies, current medications, past family history, past medical history, past social history, past surgical history and problem list. Problem list updated.   Objective  Blood pressure 130/70, weight (!) 304 lb (137.9 kg), last menstrual period 03/14/2018. Pregravid weight 272 lb (123.4 kg) Total Weight Gain 32 lb (14.5 kg) Urinalysis:      Fetal Status: Fetal Heart Rate (bpm): 130 Fundal Height: 33 cm Movement: Present     General:  Alert, oriented and cooperative. Patient is in no acute distress.  Skin: Skin is warm and dry. No rash noted.   Cardiovascular: Normal heart rate noted  Respiratory: Normal respiratory effort, no problems with respiration noted  Abdomen: Soft, gravid, appropriate for gestational age. Pain/Pressure: Absent     Pelvic:  Cervical exam deferred        Extremities: Normal range of motion.     ental Status: Normal mood and affect. Normal behavior. Normal judgment and thought content.   US Ob Follow Up  Result Date: 11/09/2018 Patient Name: Terry Zhang DOB: 02-Apr-1991 MRN: [id number] ULTRASOUND REPORT Location: [facility] Date of  Service: 11/09/2018 Indications:growth/afi Findings: Mason Jim intrauterine pregnancy is visualized with FHR at 128 BPM. Biometrics give an (U/S) Gestational age of [redacted]w[redacted]d and an (U/S) EDD of 12/20/2018; this correlates with the clinically established Estimated Date of Delivery: 01/04/19. Fetal presentation is Cephalic. Placenta: anterior. Grade: 1 AFI: 15.4 cm Growth percentile is 82.4. EFW: 2,467g (5lbs 7oz) Impression: 1. [redacted]w[redacted]d Viable Singleton Intrauterine pregnancy previously established criteria. 2. Growth is 82.4 %ile.  AFI is 15.4 cm. 3. AC measuring >97.7%ile. Recommendations: 1.Clinical correlation with the patient's History and Physical Exam. Darlina Guys, RDMS RVT There is a singleton gestation with normal amniotic fluid volume. The fetal biometry correlates with established dating.  Limited fetal anatomy was performed.The visualized fetal anatomical survey appears within normal limits within the resolution of ultrasound as described above.   Accelerated AC growth is noted on today's study.   It must be noted that a normal ultrasound is unable to rule out fetal aneuploidy.  Vena Austria, MD, [facility], [facility] 11/09/2018, 3:54 PM   US Ob Follow Up  Result Date: 10/13/2018 Patient Name: Terry Zhang DOB: 02/03/1991 MRN: [id number] ULTRASOUND REPORT Location: [facility] Date of Service: 10/13/2018 ULTRASOUND REPORT Location: [facility] Date of Service: 10/13/2018 Indications: Growth and follow up anatomy. Findings: Singleton intrauterine pregnancy is visualized with FHR at 130 BPM. Biometrics give an (U/S) Gestational age of [redacted]w[redacted]d and an (U/S) EDD of 12/23/2018; this correlates with the clinically established Estimated Date of Delivery: 01/04/19. Fetal presentation is Cephalic. Placenta: anterior. Grade: 1 Anatomic survey is now complete for 4 chamber heart. Growth percentile is 78.4%. AC is measuring 97.4%ile EFW: 1,483g (3lbs 4oz) There is no free  peritoneal fluid in the cul de sac. Impression: 1. [redacted]w[redacted]d Viable Singleton Intrauterine pregnancy previously established criteria. 2.  Growth is 78.4 %ile.  AC is measuring 97.4%ile. 3. Normal Anatomy Scan is now complete Recommendations: 1.Clinical correlation with the patient's History and Physical Exam. Darlina Guys, RDMS RVT There is a singleton gestation with normal amniotic fluid volume. The fetal biometry correlates with established dating.  The does appear to be accelerated growth in the abdominal conference.  28 week labs and Glucola obtained today.  Limited fetal anatomy was performed.The visualized fetal anatomical survey appears within normal limits within the resolution of ultrasound as described above, the missing 4 chamber views have now been adequately visualized.  It must be noted that a normal ultrasound is unable to rule out fetal aneuploidy.  Vena Austria, MD, [facility], [facility] 10/13/2018, 9:29 AM     Assessment   27 y.o. G2P0101 at [redacted]w[redacted]d by  01/04/2019, by Ultrasound presenting for routine prenatal visit  Plan   pregnancy 2  Problems (from 03/14/18 to present)    Problem Noted Resolved   Obesity in pregnancy 05/12/2018 by Farrel Conners, CNM No   Overview Addendum 10/15/2018 11:05 AM by Vena Austria, MD    BMI >=40 [X]  early 1h gtt - 93 [X]  bASA (>12 weeks) [ ]  Growth u/s 28 [X] , 32 [ ] , 36 weeks [ ]   - 28 weeks growth 10/13/18 1483g (3lbs 4oz) c/w 78.4%ile with AC 97.4% [ ]  NST/AFI weekly 36+ weeks (36[] , 37[] , 38[] , 39[] , 40[] ) [ ]  IOL by 41 weeks (scheduled, prn [] )       History of preterm delivery, currently pregnant 05/12/2018 by Farrel Conners, CNM No   Overview Addendum 07/21/2018  2:52 PM by Vena Austria, MD    Spontaneous labor and delivery of G1. Offered 17 P Baseline cervical length 4.12cm 07/21/2018      Supervision of high risk pregnancy, antepartum 05/06/2018 by Nadara Mustard, MD No   Overview Addendum  10/27/2018  5:49 PM by Natale Milch, MD    Clinic [facility] Prenatal Labs  Dating 6wk1d ultrasound Blood type: A/Positive/-- (05/08 1452)   Genetic Screen NIPS: Normal XX, Inheritest negative Antibody:Negative (05/08 1452)  Anatomic [place] Incomplete posterior fossa views [X]  24 week follow up suboptimal 4 chamber [X]  complete at 28 week growth Rubella: 1.36 (05/08 1452) Varicella: immune  GTT Early:93 Third trimester: 114 RPR: Non Reactive (05/08 1452)   Rhogam N/A HBsAg: Negative (05/08 1452)   TDaP vaccine    10/27/18    Flu Shot: 10/13/18 HIV: Non Reactive (05/08 1452)   Baby Food   Breast                             GBS:   Contraception  vasectomy, would like BC to bridge to vasectomy Pap: 05/06/2017 NIL  CBB   Pelvis tested to 6lbs 5oz  CS/VBAC N/A   Support Person            Anxiety and depression 08/03/2015 by Doreene Nest, NP No   Overview Signed 07/21/2018  2:55 PM by Vena Austria, MD    Zoloft Rx 50mg  daily 07/21/2018      Tachycardia 07/08/2018 by Conard Novak, MD 07/21/2018 by Vena Austria, MD       Gestational age appropriate obstetric precautions including but not limited to vaginal bleeding, contractions, leaking of fluid and fetal movement were reviewed in detail with the patient.   - increasing discomforts of pregnancy consider out of work early if does not improve with  conservative management  Return in about 2 weeks (around 11/23/2018) for ROB.  Vena Austria, MD, [facility], [facility] 11/09/2018, 3:58 PM

## 2018-11-17 ENCOUNTER — Telehealth: Payer: Self-pay

## 2018-11-17 NOTE — Telephone Encounter (Signed)
Samuel Jesteronna Nixon, RN [facility] calling to f/u on pt as she has been unable to get in touch c her.  Needs to know if pt passed her glucose test, new complications, vitals from last visit.  [phone]  Left detailed msg on confidential vm of above information.

## 2018-11-24 ENCOUNTER — Ambulatory Visit (INDEPENDENT_AMBULATORY_CARE_PROVIDER_SITE_OTHER): Payer: Managed Care, Other (non HMO) | Admitting: Obstetrics and Gynecology

## 2018-11-24 VITALS — BP 102/80 | Wt 301.0 lb

## 2018-11-24 DIAGNOSIS — Z3A34 34 weeks gestation of pregnancy: Secondary | ICD-10-CM

## 2018-11-24 DIAGNOSIS — O09219 Supervision of pregnancy with history of pre-term labor, unspecified trimester: Secondary | ICD-10-CM

## 2018-11-24 DIAGNOSIS — O0990 Supervision of high risk pregnancy, unspecified, unspecified trimester: Secondary | ICD-10-CM

## 2018-11-24 DIAGNOSIS — O09899 Supervision of other high risk pregnancies, unspecified trimester: Secondary | ICD-10-CM

## 2018-11-24 DIAGNOSIS — O99213 Obesity complicating pregnancy, third trimester: Secondary | ICD-10-CM

## 2018-11-24 DIAGNOSIS — O09213 Supervision of pregnancy with history of pre-term labor, third trimester: Secondary | ICD-10-CM

## 2018-11-24 DIAGNOSIS — O99210 Obesity complicating pregnancy, unspecified trimester: Secondary | ICD-10-CM

## 2018-11-24 LAB — POCT URINALYSIS DIPSTICK OB
GLUCOSE, UA: NEGATIVE
POC,PROTEIN,UA: NEGATIVE

## 2018-11-24 NOTE — Progress Notes (Signed)
Routine Prenatal Care Visit  Subjective  Terry Zhang is a 27 y.o. G2P0101 at 8849w2d being seen today for ongoing prenatal care.  She is currently monitored for the following issues for this high-risk pregnancy and has Anxiety and depression; Hyperlipidemia; Supervision of high risk pregnancy, antepartum; Obesity in pregnancy; History of preterm delivery, currently pregnant; and Paroxysmal tachycardia (HCC) on their problem list.  ----------------------------------------------------------------------------------- Patient reports no complaints.   Contractions: Not present. Vag. Bleeding: None.  Movement: Present. Denies leaking of fluid.  ----------------------------------------------------------------------------------- The following portions of the patient's history were reviewed and updated as appropriate: allergies, current medications, past family history, past medical history, past social history, past surgical history and problem list. Problem list updated.   Objective  Blood pressure 102/80, weight (!) 301 lb (136.5 kg), last menstrual period 03/14/2018. Pregravid weight 272 lb (123.4 kg) Total Weight Gain 29 lb (13.2 kg)  Body mass index is 46.45 kg/m.  Urinalysis:      Fetal Status: Fetal Heart Rate (bpm): 130   Movement: Present  Presentation: Terry Zhang  General:  Alert, oriented and cooperative. Patient is in no acute distress.  Skin: Skin is warm and dry. No rash noted.   Cardiovascular: Normal heart rate noted  Respiratory: Normal respiratory effort, no problems with respiration noted  Abdomen: Soft, gravid, appropriate for gestational age. Pain/Pressure: Absent     Pelvic:  Cervical exam deferred        Extremities: Normal range of motion.     ental Status: Normal mood and affect. Normal behavior. Normal judgment and thought content.     Assessment   27 y.o. G2P0101 at 2449w2d by  01/04/2019, by Ultrasound presenting for routine prenatal visit  Plan    pregnancy 2  Problems (from 03/14/18 to present)    Problem Noted Resolved   Obesity in pregnancy 05/12/2018 by Farrel ConnersGutierrez, Colleen, CNM No   Overview Addendum 10/15/2018 11:05 AM by Vena AustriaStaebler, Ai Sonnenfeld, MD    BMI >=40 [X]  early 1h gtt - 93 [X]  bASA (>12 weeks) [ ]  Growth u/s 28 [X] , 32 [ ] , 36 weeks [ ]   - 28 weeks growth 10/13/18 1483g (3lbs 4oz) c/w 78.4%ile with AC 97.4% [ ]  NST/AFI weekly 36+ weeks (36[] , 37[] , 38[] , 39[] , 40[] ) [ ]  IOL by 41 weeks (scheduled, prn [] )       History of preterm delivery, currently pregnant 05/12/2018 by Farrel ConnersGutierrez, Colleen, CNM No   Overview Addendum 07/21/2018  2:52 PM by Vena AustriaStaebler, Maxey Ransom, MD    Spontaneous labor and delivery of G1. Offered 17 P Baseline cervical length 4.12cm 07/21/2018      Supervision of high risk pregnancy, antepartum 05/06/2018 by Nadara MustardHarris, Robert P, MD No   Overview Addendum 10/27/2018  5:49 PM by Natale MilchSchuman, Christanna R, MD    Clinic [facility] Prenatal Labs  Dating 6wk1d ultrasound Blood type: A/Positive/-- (05/08 1452)   Genetic Screen NIPS: Normal XX, Inheritest negative Antibody:Negative (05/08 1452)  Anatomic US Incomplete posterior fossa views [X]  24 week follow up suboptimal 4 chamber [X]  complete at 28 week growth Rubella: 1.36 (05/08 1452) Varicella: immune  GTT Early:93 Third trimester: 114 RPR: Non Reactive (05/08 1452)   Rhogam N/A HBsAg: Negative (05/08 1452)   TDaP vaccine    10/27/18    Flu Shot: 10/13/18 HIV: Non Reactive (05/08 1452)   Baby Food   Breast  GBS:   Contraception  vasectomy, would like BC to bridge to vasectomy Pap: 05/06/2017 NIL  CBB   Pelvis tested to 6lbs 5oz  CS/VBAC N/A   Support Person            Anxiety and depression 08/03/2015 by Doreene Nest, NP No   Overview Signed 07/21/2018  2:55 PM by Vena Austria, MD    Zoloft Rx 50mg  daily 07/21/2018      Tachycardia 07/08/2018 by Conard Novak, MD 07/21/2018 by Vena Austria, MD       Gestational  age appropriate obstetric precautions including but not limited to vaginal bleeding, contractions, leaking of fluid and fetal movement were reviewed in detail with the patient.   - growth scan 36 weeks  Return in about 2 weeks (around 12/08/2018) for ROB/growth scan/nst.  Vena Austria, MD, [facility], [facility] 11/25/2018, 1:01 PM

## 2018-11-24 NOTE — Progress Notes (Signed)
ROB

## 2018-12-08 ENCOUNTER — Other Ambulatory Visit: Payer: Self-pay

## 2018-12-08 ENCOUNTER — Encounter: Payer: Managed Care, Other (non HMO) | Admitting: Advanced Practice Midwife

## 2018-12-08 ENCOUNTER — Other Ambulatory Visit: Payer: Managed Care, Other (non HMO)

## 2018-12-08 ENCOUNTER — Telehealth: Payer: Self-pay

## 2018-12-08 ENCOUNTER — Observation Stay
Admission: EM | Admit: 2018-12-08 | Discharge: 2018-12-08 | Disposition: A | Discharge status: Home / Self Care | Payer: Managed Care, Other (non HMO) | Attending: Obstetrics and Gynecology | Admitting: Obstetrics and Gynecology

## 2018-12-08 ENCOUNTER — Observation Stay: Payer: Managed Care, Other (non HMO)

## 2018-12-08 DIAGNOSIS — O99210 Obesity complicating pregnancy, unspecified trimester: Secondary | ICD-10-CM

## 2018-12-08 DIAGNOSIS — F329 Major depressive disorder, single episode, unspecified: Secondary | ICD-10-CM

## 2018-12-08 DIAGNOSIS — O4703 False labor before 37 completed weeks of gestation, third trimester: Secondary | ICD-10-CM | POA: Diagnosis not present

## 2018-12-08 DIAGNOSIS — O09899 Supervision of other high risk pregnancies, unspecified trimester: Secondary | ICD-10-CM

## 2018-12-08 DIAGNOSIS — O321XX0 Maternal care for breech presentation, not applicable or unspecified: Secondary | ICD-10-CM

## 2018-12-08 DIAGNOSIS — O0990 Supervision of high risk pregnancy, unspecified, unspecified trimester: Secondary | ICD-10-CM

## 2018-12-08 DIAGNOSIS — O3660X0 Maternal care for excessive fetal growth, unspecified trimester, not applicable or unspecified: Secondary | ICD-10-CM

## 2018-12-08 DIAGNOSIS — O09219 Supervision of pregnancy with history of pre-term labor, unspecified trimester: Secondary | ICD-10-CM

## 2018-12-08 DIAGNOSIS — Z3A36 36 weeks gestation of pregnancy: Secondary | ICD-10-CM | POA: Insufficient documentation

## 2018-12-08 DIAGNOSIS — F419 Anxiety disorder, unspecified: Secondary | ICD-10-CM

## 2018-12-08 NOTE — Progress Notes (Signed)
Pt to US via wheelchair by orderly.

## 2018-12-08 NOTE — Discharge Summary (Signed)
Physician Final Progress Note  Patient ID: Terry Zhang MRN: [id number] DOB/AGE: 1991/10/02 27 y.o.  Admit date: 12/08/2018 Admitting provider: Natale Milch, MD Discharge date: 12/08/2018   Admission Diagnoses: contractions  Discharge Diagnoses:  Active Problems:   Indication for care in labor and delivery, antepartum IUP at 36 weeks Reactive NST Large for gestational age fetus in cephalic presentation No cervical change  History of Present Illness: The patient is a 27 y.o. female G2P0101 at [redacted]w[redacted]d who presents from home this morning for contractions every5-7 minutes. She was scheduled for a prenatal appointment with growth and presentation ultrasound in the office this afternoon. She admits good fetal movement. She denies any leakage of fluid or vaginal bleeding. She denies any other concerns at this time. The patient was observed for several hours this morning with no change in cervical exam over that time. She had growth and presentation ultrasound while at the hospital with results below. She is to follow up with MD in 1 week at [facility] for routine prenatal care and discussion of delivery planning.   Past Medical History:  Diagnosis Date  . Anxiety   . Depression   . Hyperlipidemia 02/23/2016  . Obesity     Past Surgical History:  Procedure Laterality Date  . ANKLE SURGERY    . BREAST BIOPSY  2003  . TONSILLECTOMY      No current facility-administered medications on file prior to encounter.    Current Outpatient Medications on File Prior to Encounter  Medication Sig Dispense Refill  . butalbital-acetaminophen-caffeine (FIORICET, ESGIC) 50-325-40 MG tablet Take 1-2 tablets by mouth every 6 (six) hours as needed for headache. 20 tablet 0  . Prenatal Vit-Fe Fumarate-FA (MULTIVITAMIN-PRENATAL) 27-0.8 MG TABS tablet Take 1 tablet by mouth daily at 12 noon.    . sertraline (ZOLOFT) 50 MG tablet Take 1 tablet (50 mg total) by mouth daily. 90 tablet 3    No  Known Allergies  Social History   Socioeconomic History  . Marital status: Married    Spouse name: Not on file  . Number of children: Not on file  . Years of education: Not on file  . Highest education level: Not on file  Occupational History  . Not on file  Social Needs  . Financial resource strain: Not on file  . Food insecurity:    Worry: Not on file    Inability: Not on file  . Transportation needs:    Medical: Not on file    Non-medical: Not on file  Tobacco Use  . Smoking status: Former Smoker    Packs/day: 0.25    Types: Cigarettes    Last attempt to quit: 04/23/2018    Years since quitting: 0.6  . Smokeless tobacco: Never Used  Substance and Sexual Activity  . Alcohol use: No    Alcohol/week: 0.0 standard drinks  . Drug use: No  . Sexual activity: Not on file  Lifestyle  . Physical activity:    Days per week: Not on file    Minutes per session: Not on file  . Stress: Not on file  Relationships  . Social connections:    Talks on phone: Not on file    Gets together: Not on file    Attends religious service: Not on file    Active member of club or organization: Not on file    Attends meetings of clubs or organizations: Not on file    Relationship status: Not on file  . Intimate partner violence:  Fear of current or ex partner: Not on file    Emotionally abused: Not on file    Physically abused: Not on file    Forced sexual activity: Not on file  Other Topics Concern  . Not on file  Social History Narrative   Married.   1 daughter.   Works as a Writer at [employer].   Once enjoyed playing sports, going to [employer], swimming.    Family History  Problem Relation Age of Onset  . Arthritis Mother   . Cancer Mother 27       cervical  . Hyperlipidemia Mother   . Mental illness Mother   . Diabetes Mother   . Hyperlipidemia Father   . Hypertension Father   . Mental illness Father   . Mental illness Maternal Grandfather   . Diabetes  Maternal Grandfather   . Pancreatic cancer Maternal Grandmother 73     Review of Systems  Constitutional: Negative.   HENT: Negative.   Eyes: Negative.   Respiratory: Negative.   Cardiovascular: Negative.   Gastrointestinal: Negative.   Genitourinary: Negative.   Musculoskeletal: Negative.   Skin: Negative.   Neurological: Negative.   Endo/Heme/Allergies: Negative.   Psychiatric/Behavioral: Negative.      Physical Exam: BP 120/87 (BP Location: Left Arm)   Pulse (!) 121   Temp 98.1 F (36.7 C) (Oral)   Resp 16   Ht 5\' 8"  (1.727 m)   Wt (!) 136.5 kg   LMP 03/14/2018 (Exact Date)   BMI 45.77 kg/m   Constitutional: Well nourished, well developed female in no acute distress.  HEENT: normal Skin: Warm and dry.  Cardiovascular: Regular rate and rhythm.    Respiratory: Clear to auscultation bilateral. Normal respiratory effort Abdomen: FHT present Psych: Alert and Oriented x3. No memory deficits. Normal mood and affect.  MS: normal gait, normal bilateral lower extremity ROM/strength/stability.  Pelvic exam:  is limited by body habitus EGBUS: within normal limits Vagina: within normal limits and with normal mucosa  Cervix: 1cm/thick/-2 or -3 feels vertex, no change in dilation from previous exam  Fetal well being: 120 bpm, moderate variability, +accelerations, -decelerations Toco: every 2-4 minutes with occasional spacing to 5 minutes, mild to palpation, patient does not have to breathe through the contractions  Consults: None  Significant Findings/ Diagnostic Studies: ultrasound  CLINICAL DATA:  Large for dates, breech presentation  EXAM: OBSTETRICAL ULTRASOUND >14 WKS  FINDINGS: Number of Fetuses: 1  Heart Rate:  126 bpm  Movement: Yes  Presentation: Cephalic  Previa: No  Placental Location: Anterior  Amniotic Fluid (Subjective): Normal  Amniotic Fluid (Objective):  AFI = 14.7 cm (5%ile= 7.7 cm, 95%= 24.9 cm for 36 wks)  FETAL  BIOMETRY  BPD: 9.5cm 39w 0d  HC:   34.6cm 40w 0d  AC:   36.1cm 40w 0d  FL:   7.2cm 36w 5d  Current Mean GA: 38w 2d [place] EDC: 12/20/2018  Assigned GA:  36w 1d Assigned EDC: 01/04/2019  Estimated Fetal Weight:  3,708g 99%ile  FETAL ANATOMY  Lateral Ventricles: Appears normal  Thalami/CSP: Appears normal  Posterior Fossa:  Not visualized  Nuchal Region: Not well visualized   NFT= N/A > 20 WKS  Upper Lip: Not well visualized  Spine: Not well visualized  4 Chamber Heart on Left: Appears normal  LVOT: Appears normal  RVOT: Appears normal  Stomach on Left: Appears normal  3 Vessel Cord: Appears normal  Cord Insertion site: Appears normal  Kidneys: Appears normal  Bladder: Appears  normal  Extremities: Appears normal  Sex: Not visualized  Technically difficult due to: Advanced age and maternal habitus  Maternal Findings: No adnexal mass.  IMPRESSION: 1. Single live intrauterine pregnancy as detailed above. Discordant dates between current ultrasound which dates the pregnancy at 4138 weeks 2 days and assigned gestational age which dates the pregnancy at 36 weeks 1 day.   Electronically Signed   By: Elige KoHetal  Patel   On: 12/08/2018 15:16  Procedures: NST  Hospital Course: The patient was admitted to Labor and Delivery Triage for observation.   Discharge Condition: good  Disposition: Discharge disposition: 01-Home or Self Care       Diet: Regular diet  Discharge Activity: Activity as tolerated  Discharge Instructions    Discharge activity:  No Restrictions   Complete by:  As directed    Discharge diet:  No restrictions   Complete by:  As directed    Fetal Kick Count:  Lie on our left side for one hour after a meal, and count the number of times your baby kicks.  If it is less than 5 times, get up, move around and drink some juice.  Repeat the test 30 minutes later.  If it is still less than 5 kicks in an hour, notify  your doctor.   Complete by:  As directed    No sexual activity restrictions   Complete by:  As directed    Notify physician for a general feeling that "something is not right"   Complete by:  As directed    Notify physician for increase or change in vaginal discharge   Complete by:  As directed    Notify physician for intestinal cramps, with or without diarrhea, sometimes described as "gas pain"   Complete by:  As directed    Notify physician for leaking of fluid   Complete by:  As directed    Notify physician for low, dull backache, unrelieved by heat or Tylenol   Complete by:  As directed    Notify physician for menstrual like cramps   Complete by:  As directed    Notify physician for pelvic pressure   Complete by:  As directed    Notify physician for uterine contractions.  These may be painless and feel like the uterus is tightening or the baby is  "balling up"   Complete by:  As directed    Notify physician for vaginal bleeding   Complete by:  As directed    PRETERM LABOR:  Includes any of the follwing symptoms that occur between 20 - [redacted] weeks gestation.  If these symptoms are not stopped, preterm labor can result in preterm delivery, placing your baby at risk   Complete by:  As directed      Allergies as of 12/08/2018   No Known Allergies     Medication List    TAKE these medications   butalbital-acetaminophen-caffeine 50-325-40 MG tablet Commonly known as:  FIORICET, ESGIC Take 1-2 tablets by mouth every 6 (six) hours as needed for headache.   multivitamin-prenatal 27-0.8 MG Tabs tablet Take 1 tablet by mouth daily at 12 noon.   sertraline 50 MG tablet Commonly known as:  ZOLOFT Take 1 tablet (50 mg total) by mouth daily.      Follow-up Information    [facility]. Schedule an appointment as soon as possible for a visit in 1 week(s).   Specialty:  Obstetrics and Gynecology Why:  prenatal appointment Contact information: [address] [place] [address] [phone]          Total time spent taking care of this patient: 30 minutes  Signed: Tresea Mall, CNM  12/08/2018, 3:38 PM

## 2018-12-08 NOTE — Telephone Encounter (Signed)
FMLA/DISABILITY additional information for [employer] filled out, signature obtained and given to [place] for processing.

## 2018-12-09 ENCOUNTER — Ambulatory Visit (INDEPENDENT_AMBULATORY_CARE_PROVIDER_SITE_OTHER): Payer: Managed Care, Other (non HMO) | Admitting: Obstetrics and Gynecology

## 2018-12-09 ENCOUNTER — Observation Stay
Admission: RE | Admit: 2018-12-09 | Discharge: 2018-12-09 | Disposition: A | Discharge status: Home / Self Care | Payer: Managed Care, Other (non HMO) | Attending: Advanced Practice Midwife | Admitting: Advanced Practice Midwife

## 2018-12-09 ENCOUNTER — Other Ambulatory Visit: Payer: Self-pay

## 2018-12-09 VITALS — BP 142/79 | Wt 312.0 lb

## 2018-12-09 DIAGNOSIS — R030 Elevated blood-pressure reading, without diagnosis of hypertension: Secondary | ICD-10-CM | POA: Diagnosis present

## 2018-12-09 DIAGNOSIS — O99283 Endocrine, nutritional and metabolic diseases complicating pregnancy, third trimester: Principal | ICD-10-CM | POA: Insufficient documentation

## 2018-12-09 DIAGNOSIS — F419 Anxiety disorder, unspecified: Secondary | ICD-10-CM | POA: Insufficient documentation

## 2018-12-09 DIAGNOSIS — Z3A36 36 weeks gestation of pregnancy: Secondary | ICD-10-CM

## 2018-12-09 DIAGNOSIS — Z87891 Personal history of nicotine dependence: Secondary | ICD-10-CM | POA: Insufficient documentation

## 2018-12-09 DIAGNOSIS — O0990 Supervision of high risk pregnancy, unspecified, unspecified trimester: Secondary | ICD-10-CM

## 2018-12-09 DIAGNOSIS — O09219 Supervision of pregnancy with history of pre-term labor, unspecified trimester: Secondary | ICD-10-CM

## 2018-12-09 DIAGNOSIS — O26893 Other specified pregnancy related conditions, third trimester: Secondary | ICD-10-CM

## 2018-12-09 DIAGNOSIS — O99213 Obesity complicating pregnancy, third trimester: Secondary | ICD-10-CM | POA: Insufficient documentation

## 2018-12-09 DIAGNOSIS — O99210 Obesity complicating pregnancy, unspecified trimester: Secondary | ICD-10-CM

## 2018-12-09 DIAGNOSIS — O09899 Supervision of other high risk pregnancies, unspecified trimester: Secondary | ICD-10-CM

## 2018-12-09 DIAGNOSIS — F329 Major depressive disorder, single episode, unspecified: Secondary | ICD-10-CM | POA: Insufficient documentation

## 2018-12-09 DIAGNOSIS — Z8249 Family history of ischemic heart disease and other diseases of the circulatory system: Secondary | ICD-10-CM | POA: Diagnosis not present

## 2018-12-09 DIAGNOSIS — Z79899 Other long term (current) drug therapy: Secondary | ICD-10-CM | POA: Insufficient documentation

## 2018-12-09 DIAGNOSIS — O09213 Supervision of pregnancy with history of pre-term labor, third trimester: Secondary | ICD-10-CM

## 2018-12-09 DIAGNOSIS — O99343 Other mental disorders complicating pregnancy, third trimester: Secondary | ICD-10-CM | POA: Insufficient documentation

## 2018-12-09 LAB — CBC
HCT: 37.6 % (ref 36.0–46.0)
Hemoglobin: 12.3 g/dL (ref 12.0–15.0)
MCH: 28.1 pg (ref 26.0–34.0)
MCHC: 32.7 g/dL (ref 30.0–36.0)
MCV: 86 fL (ref 80.0–100.0)
NRBC: 0 % (ref 0.0–0.2)
Platelets: 183 10*3/uL (ref 150–400)
RBC: 4.37 MIL/uL (ref 3.87–5.11)
RDW: 15.7 % — ABNORMAL HIGH (ref 11.5–15.5)
WBC: 8.8 10*3/uL (ref 4.0–10.5)

## 2018-12-09 LAB — POCT URINALYSIS DIPSTICK OB: Glucose, UA: NEGATIVE

## 2018-12-09 LAB — PROTEIN / CREATININE RATIO, URINE
Creatinine, Urine: 224 mg/dL
Protein Creatinine Ratio: 0.1 mg/mg{Cre} (ref 0.00–0.15)
Total Protein, Urine: 22 mg/dL

## 2018-12-09 LAB — COMPREHENSIVE METABOLIC PANEL
ALT: 9 U/L (ref 0–44)
AST: 14 U/L — ABNORMAL LOW (ref 15–41)
Albumin: 2.8 g/dL — ABNORMAL LOW (ref 3.5–5.0)
Alkaline Phosphatase: 98 U/L (ref 38–126)
Anion gap: 10 (ref 5–15)
BUN: 6 mg/dL (ref 6–20)
CO2: 20 mmol/L — ABNORMAL LOW (ref 22–32)
Calcium: 9.5 mg/dL (ref 8.9–10.3)
Chloride: 104 mmol/L (ref 98–111)
Creatinine, Ser: 0.38 mg/dL — ABNORMAL LOW (ref 0.44–1.00)
GFR calc Af Amer: >60 mL/min (ref >60)
GFR calc non Af Amer: >60 mL/min (ref >60)
Glucose, Bld: 82 mg/dL (ref 70–99)
Potassium: 3.8 mmol/L (ref 3.5–5.1)
Sodium: 134 mmol/L — ABNORMAL LOW (ref 135–145)
Total Bilirubin: 0.6 mg/dL (ref 0.3–1.2)
Total Protein: 6.1 g/dL — ABNORMAL LOW (ref 6.5–8.1)

## 2018-12-09 NOTE — Progress Notes (Signed)
ROB (2+ ketones on urine dip) L&D yesterday contractions Lost mucous plug today

## 2018-12-09 NOTE — OB Triage Note (Signed)
Pt G2P1 740w2d [facility] Initial BP 127/80. Will continue to monitor. Pt denies blurry vision, RUQ pain and states she does have a h/a that presented a couple of hours ago. Rating h/a pain 3/10. Denies ctx and LOF. States + FM. VSS.

## 2018-12-09 NOTE — Discharge Summary (Signed)
Physician Final Progress Note  Patient ID: Terry Zhang MRN: [id number] DOB/AGE: 1991-03-05 27 y.o.  Admit date: 12/09/2018 Admitting provider: Conard Novak, MD Discharge date: 12/09/2018   Admission Diagnoses: elevated blood pressure  Discharge Diagnoses:  Active Problems:   Indication for care in labor and delivery, antepartum IUP at 36 weeks Reactive NST Normal blood pressures Normal PIH labs  History of Present Illness: The patient is a 27 y.o. female G2P0101 at [redacted]w[redacted]d who presents from clinic for elevated blood pressures. She reports good fetal movement. She reports some contractions but they are not as strong as they were yesterday. She denies leakage of fluid or vaginal bleeding. She denies headache, visual changes, or epigastric pain. She has made a plan with Dr Terry Zhang for return to clinic on Friday and induction of labor on Saturday. The patient has no other questions or concerns at this time.   Past Medical History:  Diagnosis Date  . Anxiety   . Depression   . Hyperlipidemia 02/23/2016  . Obesity     Past Surgical History:  Procedure Laterality Date  . ANKLE SURGERY    . BREAST BIOPSY  2003  . TONSILLECTOMY      No current facility-administered medications on file prior to encounter.    Current Outpatient Medications on File Prior to Encounter  Medication Sig Dispense Refill  . Prenatal Vit-Fe Fumarate-FA (MULTIVITAMIN-PRENATAL) 27-0.8 MG TABS tablet Take 1 tablet by mouth daily at 12 noon.    . sertraline (ZOLOFT) 50 MG tablet Take 1 tablet (50 mg total) by mouth daily. 90 tablet 3  . butalbital-acetaminophen-caffeine (FIORICET, ESGIC) 50-325-40 MG tablet Take 1-2 tablets by mouth every 6 (six) hours as needed for headache. (Patient not taking: Reported on 12/09/2018) 20 tablet 0    No Known Allergies  Social History   Socioeconomic History  . Marital status: Married    Spouse name: Not on file  . Number of children: Not on file  . Years  of education: Not on file  . Highest education level: Not on file  Occupational History  . Not on file  Social Needs  . Financial resource strain: Not hard at all  . Food insecurity:    Worry: Never true    Inability: Never true  . Transportation needs:    Medical: No    Non-medical: No  Tobacco Use  . Smoking status: Former Smoker    Packs/day: 0.25    Types: Cigarettes    Last attempt to quit: 04/23/2018    Years since quitting: 0.6  . Smokeless tobacco: Never Used  Substance and Sexual Activity  . Alcohol use: No    Alcohol/week: 0.0 standard drinks  . Drug use: No  . Sexual activity: Not on file  Lifestyle  . Physical activity:    Days per week: 0 days    Minutes per session: 0 min  . Stress: Only a little  Relationships  . Social connections:    Talks on phone: Three times a week    Gets together: Three times a week    Attends religious service: Never    Active member of club or organization: No    Attends meetings of clubs or organizations: Never    Relationship status: Married  . Intimate partner violence:    Fear of current or ex partner: Not on file    Emotionally abused: Not on file    Physically abused: Not on file    Forced sexual activity: Not on file  Other Topics Concern  . Not on file  Social History Narrative   Married.   1 daughter.   Works as a Writer at [employer].   Once enjoyed playing sports, going to [employer], swimming.    Family History  Problem Relation Age of Onset  . Arthritis Mother   . Cancer Mother 83       cervical  . Hyperlipidemia Mother   . Mental illness Mother   . Diabetes Mother   . Hyperlipidemia Father   . Hypertension Father   . Mental illness Father   . Mental illness Maternal Grandfather   . Diabetes Maternal Grandfather   . Pancreatic cancer Maternal Grandmother 73     Review of Systems  Constitutional: Negative.   HENT: Negative.   Eyes: Negative.   Respiratory: Negative.   Cardiovascular:  Negative.   Gastrointestinal: Negative.   Genitourinary: Negative.   Musculoskeletal: Negative.   Skin: Negative.   Neurological: Negative.   Endo/Heme/Allergies: Negative.   Psychiatric/Behavioral: Negative.      Physical Exam: BP 124/67   Pulse 93   Temp 98.2 F (36.8 C) (Oral)   Resp 16   Ht 5\' 8"  (1.727 m)   Wt (!) 136.5 kg   LMP 03/14/2018 (Exact Date)   BMI 45.77 kg/m   Constitutional: Well nourished, well developed female in no acute distress.  HEENT: normal Skin: Warm and dry.  Cardiovascular: Regular rate and rhythm.   Respiratory: Clear to auscultation bilateral. Normal respiratory effort Abdomen: FHT present Psych: Alert and Oriented x3. No memory deficits. Normal mood and affect.   Pelvic exam: deferred  Consults: None  Significant Findings/ Diagnostic Studies: labs:   Results for Terry Zhang (MRN [id number]) as of 12/09/2018 18:50  Ref. Range 12/09/2018 15:05 12/09/2018 16:55 12/09/2018 17:10  COMPREHENSIVE METABOLIC PANEL Unknown   Rpt (A)  Sodium Latest Ref Range: 135 - 145 mmol/L   134 (L)  Potassium Latest Ref Range: 3.5 - 5.1 mmol/L   3.8  Chloride Latest Ref Range: 98 - 111 mmol/L   104  CO2 Latest Ref Range: 22 - 32 mmol/L   20 (L)  Glucose Latest Ref Range: 70 - 99 mg/dL   82  BUN Latest Ref Range: 6 - 20 mg/dL   6  Creatinine Latest Ref Range: 0.44 - 1.00 mg/dL   7.84 (L)  Calcium Latest Ref Range: 8.9 - 10.3 mg/dL   9.5  Anion gap Latest Ref Range: 5 - 15    10  Alkaline Phosphatase Latest Ref Range: 38 - 126 U/L   98  Albumin Latest Ref Range: 3.5 - 5.0 g/dL   2.8 (L)  AST Latest Ref Range: 15 - 41 U/L   14 (L)  ALT Latest Ref Range: 0 - 44 U/L   9  Total Protein Latest Ref Range: 6.5 - 8.1 g/dL   6.1 (L)  Total Bilirubin Latest Ref Range: 0.3 - 1.2 mg/dL   0.6  GFR, Est Non African American Latest Ref Range: >60 mL/min   >60  GFR, Est African American Latest Ref Range: >60 mL/min   >60  WBC Latest Ref Range: 4.0 - 10.5 K/uL   8.8   RBC Latest Ref Range: 3.87 - 5.11 MIL/uL   4.37  Hemoglobin Latest Ref Range: 12.0 - 15.0 g/dL   69.6  HCT Latest Ref Range: 36.0 - 46.0 %   37.6  MCV Latest Ref Range: 80.0 - 100.0 fL   86.0  MCH Latest Ref  Range: 26.0 - 34.0 pg   28.1  MCHC Latest Ref Range: 30.0 - 36.0 g/dL   82.9  RDW Latest Ref Range: 11.5 - 15.5 %   15.7 (H)  Platelets Latest Ref Range: 150 - 400 K/uL   183  nRBC Latest Ref Range: 0.0 - 0.2 %   0.0  Glucose, UA Latest Ref Range: Negative  Negative    Ketones, UA Unknown 2+    Protein Latest Ref Range: Negative, Trace, Small (1+), Moderate (2+), Large (3+), 4+  Moderate (2+)    Total Protein, Urine Latest Units: mg/dL  22   Protein Creatinine Ratio Latest Ref Range: 0.00 - 0.15 mg/mgCre  0.10   Creatinine, Urine Latest Units: mg/dL  562     Procedures: NST  Hospital Course: The patient was admitted to Labor and Delivery Triage for observation.   Discharge Condition: good  Disposition: Discharge disposition: 01-Home or Self Care       Diet: Regular diet  Discharge Activity: Activity as tolerated  Discharge Instructions    Discharge activity:  No Restrictions   Complete by:  As directed    Discharge diet:  No restrictions   Complete by:  As directed    Fetal Kick Count:  Lie on our left side for one hour after a meal, and count the number of times your baby kicks.  If it is less than 5 times, get up, move around and drink some juice.  Repeat the test 30 minutes later.  If it is still less than 5 kicks in an hour, notify your doctor.   Complete by:  As directed    No sexual activity restrictions   Complete by:  As directed    Notify physician for a general feeling that "something is not right"   Complete by:  As directed    Notify physician for increase or change in vaginal discharge   Complete by:  As directed    Notify physician for intestinal cramps, with or without diarrhea, sometimes described as "gas pain"   Complete by:  As directed     Notify physician for leaking of fluid   Complete by:  As directed    Notify physician for low, dull backache, unrelieved by heat or Tylenol   Complete by:  As directed    Notify physician for menstrual like cramps   Complete by:  As directed    Notify physician for pelvic pressure   Complete by:  As directed    Notify physician for uterine contractions.  These may be painless and feel like the uterus is tightening or the baby is  "balling up"   Complete by:  As directed    Notify physician for vaginal bleeding   Complete by:  As directed    PRETERM LABOR:  Includes any of the follwing symptoms that occur between 20 - [redacted] weeks gestation.  If these symptoms are not stopped, preterm labor can result in preterm delivery, placing your baby at risk   Complete by:  As directed      Allergies as of 12/09/2018   No Known Allergies     Medication List    STOP taking these medications   butalbital-acetaminophen-caffeine 50-325-40 MG tablet Commonly known as:  FIORICET, ESGIC     TAKE these medications   multivitamin-prenatal 27-0.8 MG Tabs tablet Take 1 tablet by mouth daily at 12 noon.   sertraline 50 MG tablet Commonly known as:  ZOLOFT Take 1 tablet (50 mg total)  by mouth daily.      Follow-up Information    [facility] Go to.   Specialty:  Obstetrics and Gynecology Why:  regular scheduled prenatal appointment Contact information: [address] [place] [phone]          Total time spent taking care of this patient: 15 minutes  Signed: Tresea MallJane Uchenna Zhang, CNM  12/09/2018, 6:43 PM

## 2018-12-09 NOTE — Progress Notes (Signed)
Routine Prenatal Care Visit  Subjective  Terry Zhang is a 27 y.o. G2P0101 at [redacted]w[redacted]d being seen today for ongoing prenatal care.  She is currently monitored for the following issues for this high-risk pregnancy and has Anxiety and depression; Hyperlipidemia; Supervision of high risk pregnancy, antepartum; Obesity in pregnancy; History of preterm delivery, currently pregnant; Paroxysmal tachycardia (HCC); and Indication for care in labor and delivery, antepartum on their problem list.  ----------------------------------------------------------------------------------- Patient reports states just doesn't feel good, tired.  Mild headaches..    .  .   . Denies leaking of fluid.  ----------------------------------------------------------------------------------- The following portions of the patient's history were reviewed and updated as appropriate: allergies, current medications, past family history, past medical history, past social history, past surgical history and problem list. Problem list updated.   Objective  Blood pressure (!) 142/79, weight (!) 312 lb (141.5 kg), last menstrual period 03/14/2018. Pregravid weight 272 lb (123.4 kg) Total Weight Gain 40 lb (18.1 kg) Urinalysis:      Fetal Status:           General:  Alert, oriented and cooperative. Patient is in no acute distress.  Skin: Skin is warm and dry. No rash noted.   Cardiovascular: Normal heart rate noted  Respiratory: Normal respiratory effort, no problems with respiration noted  Abdomen: Soft, gravid, appropriate for gestational age.       Pelvic:  Cervical exam deferred        Extremities: Normal range of motion.     ental Status: Normal mood and affect. Normal behavior. Normal judgment and thought content.     Assessment   27 y.o. G2P0101 at [redacted]w[redacted]d by  01/04/2019, by Ultrasound presenting for routine prenatal visit  Plan   pregnancy 2  Problems (from 03/14/18 to present)    Problem Noted Resolved   Obesity in pregnancy 05/12/2018 by Farrel Conners, CNM No   Overview Addendum 10/15/2018 11:05 AM by Vena Austria, MD    BMI >=40 [X]  early 1h gtt - 93 [X]  bASA (>12 weeks) [ ]  Growth u/s 28 [X] , 32 [ ] , 36 weeks [ ]   - 28 weeks growth 10/13/18 1483g (3lbs 4oz) c/w 78.4%ile with AC 97.4% [ ]  NST/AFI weekly 36+ weeks (36[] , 37[] , 38[] , 39[] , 40[] ) [ ]  IOL by 41 weeks (scheduled, prn [] )       History of preterm delivery, currently pregnant 05/12/2018 by Farrel Conners, CNM No   Overview Addendum 07/21/2018  2:52 PM by Vena Austria, MD    Spontaneous labor and delivery of G1. Offered 17 P Baseline cervical length 4.12cm 07/21/2018      Supervision of high risk pregnancy, antepartum 05/06/2018 by Nadara Mustard, MD No   Overview Addendum 10/27/2018  5:49 PM by Natale Milch, MD    [facility] Prenatal Labs  Dating 6wk1d ultrasound Blood type: A/Positive/-- (05/08 1452)   Genetic Screen NIPS: Normal XX, Inheritest negative Antibody:Negative (05/08 1452)  Anatomic [place] Incomplete posterior fossa views [X]  24 week follow up suboptimal 4 chamber [X]  complete at 28 week growth Rubella: 1.36 (05/08 1452) Varicella: immune  GTT Early:93 Third trimester: 114 RPR: Non Reactive (05/08 1452)   Rhogam N/A HBsAg: Negative (05/08 1452)   TDaP vaccine    10/27/18    Flu Shot: 10/13/18 HIV: Non Reactive (05/08 1452)   Baby Food   Breast  GBS:   Contraception  vasectomy, would like BC to bridge to vasectomy Pap: 05/06/2017 NIL  CBB   Pelvis tested to 6lbs 5oz  CS/VBAC N/A   Support Person            Anxiety and depression 08/03/2015 by Doreene Nestlark, Katherine K, NP No   Overview Signed 07/21/2018  2:55 PM by Vena AustriaStaebler, Courtne Lighty, MD    Zoloft Rx 50mg  daily 07/21/2018      Tachycardia 07/08/2018 by Conard NovakJackson, Stephen D, MD 07/21/2018 by Vena AustriaStaebler, Harbor Paster, MD       Gestational age appropriate obstetric precautions including but not limited to vaginal bleeding,  contractions, leaking of fluid and fetal movement were reviewed in detail with the patient.   - To L&D for serial BP and preeclampsia labs - If labs normal and BP normotensive or mild range has BP check on 12/11/18 in clinic, in case of mild preeclampsia induction at 37 weeks - If severe range BP or lab abnormalities discussed may need induction today  Return in 2 days (on 12/11/2018) for BP check.  Vena AustriaAndreas Clotilde Loth, MD, Evern CoreFACOG [facility], [facility] 12/09/2018, 3:45 PM

## 2018-12-10 LAB — CULTURE, BETA STREP (GROUP B ONLY)

## 2018-12-10 NOTE — Progress Notes (Signed)
Call patient and let her now.

## 2018-12-11 ENCOUNTER — Encounter: Payer: Self-pay | Admitting: Obstetrics and Gynecology

## 2018-12-11 ENCOUNTER — Ambulatory Visit (INDEPENDENT_AMBULATORY_CARE_PROVIDER_SITE_OTHER): Payer: Managed Care, Other (non HMO) | Admitting: Obstetrics and Gynecology

## 2018-12-11 VITALS — BP 132/86 | Wt 300.0 lb

## 2018-12-11 DIAGNOSIS — O133 Gestational [pregnancy-induced] hypertension without significant proteinuria, third trimester: Secondary | ICD-10-CM

## 2018-12-11 DIAGNOSIS — O99213 Obesity complicating pregnancy, third trimester: Secondary | ICD-10-CM

## 2018-12-11 DIAGNOSIS — O99210 Obesity complicating pregnancy, unspecified trimester: Secondary | ICD-10-CM

## 2018-12-11 DIAGNOSIS — F419 Anxiety disorder, unspecified: Secondary | ICD-10-CM

## 2018-12-11 DIAGNOSIS — O99343 Other mental disorders complicating pregnancy, third trimester: Secondary | ICD-10-CM

## 2018-12-11 DIAGNOSIS — O0990 Supervision of high risk pregnancy, unspecified, unspecified trimester: Secondary | ICD-10-CM

## 2018-12-11 DIAGNOSIS — O09219 Supervision of pregnancy with history of pre-term labor, unspecified trimester: Secondary | ICD-10-CM

## 2018-12-11 DIAGNOSIS — O139 Gestational [pregnancy-induced] hypertension without significant proteinuria, unspecified trimester: Secondary | ICD-10-CM | POA: Insufficient documentation

## 2018-12-11 DIAGNOSIS — Z3A36 36 weeks gestation of pregnancy: Secondary | ICD-10-CM

## 2018-12-11 DIAGNOSIS — O09213 Supervision of pregnancy with history of pre-term labor, third trimester: Secondary | ICD-10-CM

## 2018-12-11 DIAGNOSIS — O09899 Supervision of other high risk pregnancies, unspecified trimester: Secondary | ICD-10-CM

## 2018-12-11 DIAGNOSIS — F329 Major depressive disorder, single episode, unspecified: Secondary | ICD-10-CM

## 2018-12-11 LAB — POCT URINALYSIS DIPSTICK OB: Glucose, UA: NEGATIVE

## 2018-12-11 NOTE — Progress Notes (Signed)
Routine Prenatal Care Visit  Subjective  Terry Zhang is a 27 y.o. G2P0101 at [redacted]w[redacted]d being seen today for ongoing prenatal care.  She is currently monitored for the following issues for this high-risk pregnancy and has Anxiety and depression; Hyperlipidemia; Supervision of high risk pregnancy, antepartum; Obesity in pregnancy; History of preterm delivery, currently pregnant; Paroxysmal tachycardia (HCC); Indication for care in labor and delivery, antepartum; and Gestational hypertension on their problem list.  ----------------------------------------------------------------------------------- Patient reports headache. She has tried nothing for her headache.  She denies visual change or RUQ pain.  Contractions: Not present. Vag. Bleeding: None.  Movement: Present. Denies leaking of fluid.  ----------------------------------------------------------------------------------- The following portions of the patient's history were reviewed and updated as appropriate: allergies, current medications, past family history, past medical history, past social history, past surgical history and problem list. Problem list updated.  Objective  Blood pressure 132/86, weight 300 lb (136.1 kg), last menstrual period 03/14/2018. Pregravid weight 272 lb (123.4 kg) Total Weight Gain 28 lb (12.7 kg) Urinalysis: Urine Protein Small (1+)  Urine Glucose Negative  Fetal Status: Fetal Heart Rate (bpm): 125   Movement: Present     General:  Alert, oriented and cooperative. Patient is in no acute distress.  Skin: Skin is warm and dry. No rash noted.   Cardiovascular: Normal heart rate noted  Respiratory: Normal respiratory effort, no problems with respiration noted  Abdomen: Soft, gravid, appropriate for gestational age. Pain/Pressure: Absent     Pelvic:  Cervical exam deferred        Extremities: Normal range of motion.  Edema: Trace  Mental Status: Normal mood and affect. Normal behavior. Normal judgment and  thought content.   Assessment   27 y.o. G2P0101 at [redacted]w[redacted]d by  01/04/2019, by Ultrasound presenting for routine prenatal visit  Plan   pregnancy 2  Problems (from 03/14/18 to present)    Problem Noted Resolved   Gestational hypertension 12/11/2018 by Conard Novak, MD No   Obesity in pregnancy 05/12/2018 by Farrel Conners, CNM No   Overview Addendum 10/15/2018 11:05 AM by Vena Austria, MD    BMI >=40 [X]  early 1h gtt - 93 [X]  bASA (>12 weeks) [ ]  Growth u/s 28 [X] , 32 [ ] , 36 weeks [ ]   - 28 weeks growth 10/13/18 1483g (3lbs 4oz) c/w 78.4%ile with AC 97.4% [ ]  NST/AFI weekly 36+ weeks (36[] , 37[] , 38[] , 39[] , 40[] ) [ ]  IOL by 41 weeks (scheduled, prn [] )       History of preterm delivery, currently pregnant 05/12/2018 by Farrel Conners, CNM No   Overview Addendum 07/21/2018  2:52 PM by Vena Austria, MD    Spontaneous labor and delivery of G1. Offered 17 P Baseline cervical length 4.12cm 07/21/2018      Supervision of high risk pregnancy, antepartum 05/06/2018 by Nadara Mustard, MD No   Overview Addendum 10/27/2018  5:49 PM by Natale Milch, MD    [facility] Prenatal Labs  Dating 6wk1d ultrasound Blood type: A/Positive/-- (05/08 1452)   Genetic Screen NIPS: Normal XX, Inheritest negative Antibody:Negative (05/08 1452)  Anatomic [place] Incomplete posterior fossa views [X]  24 week follow up suboptimal 4 chamber [X]  complete at 28 week growth Rubella: 1.36 (05/08 1452) Varicella: immune  GTT Early:93 Third trimester: 114 RPR: Non Reactive (05/08 1452)   Rhogam N/A HBsAg: Negative (05/08 1452)   TDaP vaccine    10/27/18    Flu Shot: 10/13/18 HIV: Non Reactive (05/08 1452)   Baby Food   Breast  GBS:   Contraception vasectomy, would like BC to bridge to vasectomy Pap: 05/06/2017 NIL  CBB   Pelvis tested to 6lbs 5oz  CS/VBAC N/A   Support Person         Anxiety and depression 08/03/2015 by Doreene Nestlark, Katherine K, NP No   Overview  Signed 07/21/2018  2:55 PM by Vena AustriaStaebler, Andreas, MD    Zoloft Rx 50mg  daily 07/21/2018      Tachycardia 07/08/2018 by Conard NovakJackson, Stephen D, MD 07/21/2018 by Vena AustriaStaebler, Andreas, MD     Preterm labor symptoms and general obstetric precautions including but not limited to vaginal bleeding, contractions, leaking of fluid and fetal movement were reviewed in detail with the patient. Please refer to After Visit Summary for other counseling recommendations.   - counseled pt to take Tylenol for HA, if does not resolve, then recommend she go to L&D. If BPs persistenatly >160 SBP or > 110 DBP go to L&D or any other concerning symptom.  Return in 4 days (on 12/15/2018) for ROB with AMS.  Thomasene MohairStephen Jackson, MD, Merlinda FrederickFACOG [facility], [facility] 12/11/2018 4:30 PM

## 2018-12-12 ENCOUNTER — Observation Stay
Admission: EM | Admit: 2018-12-12 | Discharge: 2018-12-13 | Disposition: A | Discharge status: Home / Self Care | Payer: Managed Care, Other (non HMO) | Attending: Obstetrics and Gynecology | Admitting: Obstetrics and Gynecology

## 2018-12-12 DIAGNOSIS — R030 Elevated blood-pressure reading, without diagnosis of hypertension: Secondary | ICD-10-CM | POA: Insufficient documentation

## 2018-12-12 DIAGNOSIS — Z3A36 36 weeks gestation of pregnancy: Secondary | ICD-10-CM | POA: Insufficient documentation

## 2018-12-12 DIAGNOSIS — O26893 Other specified pregnancy related conditions, third trimester: Principal | ICD-10-CM | POA: Insufficient documentation

## 2018-12-12 DIAGNOSIS — O163 Unspecified maternal hypertension, third trimester: Secondary | ICD-10-CM | POA: Diagnosis present

## 2018-12-13 ENCOUNTER — Other Ambulatory Visit: Payer: Self-pay

## 2018-12-13 DIAGNOSIS — Z3A36 36 weeks gestation of pregnancy: Secondary | ICD-10-CM | POA: Diagnosis not present

## 2018-12-13 DIAGNOSIS — O26893 Other specified pregnancy related conditions, third trimester: Secondary | ICD-10-CM | POA: Diagnosis not present

## 2018-12-13 DIAGNOSIS — O163 Unspecified maternal hypertension, third trimester: Secondary | ICD-10-CM | POA: Diagnosis present

## 2018-12-13 DIAGNOSIS — R030 Elevated blood-pressure reading, without diagnosis of hypertension: Secondary | ICD-10-CM | POA: Diagnosis not present

## 2018-12-13 HISTORY — DX: Unspecified maternal hypertension, third trimester: O16.3

## 2018-12-13 LAB — CBC
HCT: 37.6 % (ref 36.0–46.0)
Hemoglobin: 12.4 g/dL (ref 12.0–15.0)
MCH: 28.3 pg (ref 26.0–34.0)
MCHC: 33 g/dL (ref 30.0–36.0)
MCV: 85.8 fL (ref 80.0–100.0)
Platelets: 180 10*3/uL (ref 150–400)
RBC: 4.38 MIL/uL (ref 3.87–5.11)
RDW: 15.7 % — ABNORMAL HIGH (ref 11.5–15.5)
WBC: 9.1 10*3/uL (ref 4.0–10.5)
nRBC: 0 % (ref 0.0–0.2)

## 2018-12-13 LAB — PROTEIN / CREATININE RATIO, URINE
CREATININE, URINE: 214 mg/dL
Protein Creatinine Ratio: 0.1 mg/mg{Cre} (ref 0.00–0.15)
Total Protein, Urine: 21 mg/dL

## 2018-12-13 LAB — COMPREHENSIVE METABOLIC PANEL
ALBUMIN: 2.9 g/dL — AB (ref 3.5–5.0)
ALT: 10 U/L (ref 0–44)
AST: 15 U/L (ref 15–41)
Alkaline Phosphatase: 98 U/L (ref 38–126)
Anion gap: 10 (ref 5–15)
BILIRUBIN TOTAL: 0.1 mg/dL — AB (ref 0.3–1.2)
BUN: 8 mg/dL (ref 6–20)
CO2: 19 mmol/L — ABNORMAL LOW (ref 22–32)
Calcium: 9.4 mg/dL (ref 8.9–10.3)
Chloride: 107 mmol/L (ref 98–111)
Creatinine, Ser: 0.38 mg/dL — ABNORMAL LOW (ref 0.44–1.00)
GFR calc Af Amer: >60 mL/min (ref >60)
GLUCOSE: 95 mg/dL (ref 70–99)
Potassium: 3.5 mmol/L (ref 3.5–5.1)
Sodium: 136 mmol/L (ref 135–145)
Total Protein: 6.4 g/dL — ABNORMAL LOW (ref 6.5–8.1)

## 2018-12-13 MED ORDER — ACETAMINOPHEN 325 MG PO TABS: 650.0000 mg | ORAL_TABLET | ORAL | Status: DC | PRN

## 2018-12-13 NOTE — OB Triage Note (Signed)
Patient came in for observation for elevated blood pressure of 156/111 at home. Patient denies uterine contractions. Patient reports a headache that patient rates 6/10. Patient denies leaking of fluid, patient denies vaginal bleeding and spotting. Patient reports + FM upon arrival. Vital signs stable and patient afebrile. FHR baseline 130 with moderate variability with accelerations 15 x 15 and no decelerations. Significant other and mother at bedside. Will continue to monitor.

## 2018-12-13 NOTE — Discharge Summary (Signed)
See final progress note. 

## 2018-12-13 NOTE — Final Progress Note (Signed)
Physician Final Progress Note  Patient ID: Terry Zhang MRN: [id number] DOB/AGE: 01/02/1991 27 y.o.  Admit date: 12/12/2018 Admitting provider: Vena Austria, MD Discharge date: 12/13/2018   Admission Diagnoses: Elevated blood pressures affecting pregnancy in third trimester  Discharge Diagnoses:  Active Problems:   Elevated blood pressure affecting pregnancy in third trimester, antepartum   27 y.o. G2P0101 at [redacted]w[redacted]d with elevated BP noted in clinic at [redacted]w[redacted]d, work up negative at that time with normotensive blood pressures during pre-eclampsia work up.  Normotensive on follow up clinic visit at [redacted]w[redacted]d. She reports and elevated home systolic reading of 156.  Blood pressures normotensive throughout evaluation, with repeat preeclampsia labs negative.  +FM, no LOF, no VB, no CTX  Temp:  [98.3 F (36.8 C)] 98.3 F (36.8 C) (12/14 2358) Pulse Rate:  [106-124] 115 (12/15 0200) Resp:  [19] 19 (12/14 2358) BP: (123-132)/(74-88) 129/76 (12/15 0200) Weight:  [136.1 kg] 136.1 kg (12/14 2358)   Consults: None  Significant Findings/ Diagnostic Studies:  Results for orders placed or performed during the hospital encounter of 12/12/18 (from the past 24 hour(s))  Protein / creatinine ratio, urine     Status: None   Collection Time: 12/13/18  1:09 AM  Result Value Ref Range   Creatinine, Urine 214 mg/dL   Total Protein, Urine 21 mg/dL   Protein Creatinine Ratio 0.10 0.00 - 0.15 mg/mg[Cre]  CBC on admission     Status: Abnormal   Collection Time: 12/13/18  1:10 AM  Result Value Ref Range   WBC 9.1 4.0 - 10.5 K/uL   RBC 4.38 3.87 - 5.11 MIL/uL   Hemoglobin 12.4 12.0 - 15.0 g/dL   HCT 40.9 81.1 - 91.4 %   MCV 85.8 80.0 - 100.0 fL   MCH 28.3 26.0 - 34.0 pg   MCHC 33.0 30.0 - 36.0 g/dL   RDW 78.2 (H) 95.6 - 21.3 %   Platelets 180 150 - 400 K/uL   nRBC 0.0 0.0 - 0.2 %  Comprehensive metabolic panel     Status: Abnormal   Collection Time: 12/13/18  1:10 AM  Result Value Ref Range    Sodium 136 135 - 145 mmol/L   Potassium 3.5 3.5 - 5.1 mmol/L   Chloride 107 98 - 111 mmol/L   CO2 19 (L) 22 - 32 mmol/L   Glucose, Bld 95 70 - 99 mg/dL   BUN 8 6 - 20 mg/dL   Creatinine, Ser 0.86 (L) 0.44 - 1.00 mg/dL   Calcium 9.4 8.9 - 57.8 mg/dL   Total Protein 6.4 (L) 6.5 - 8.1 g/dL   Albumin 2.9 (L) 3.5 - 5.0 g/dL   AST 15 15 - 41 U/L   ALT 10 0 - 44 U/L   Alkaline Phosphatase 98 38 - 126 U/L   Total Bilirubin 0.1 (L) 0.3 - 1.2 mg/dL   GFR calc non Af Amer >60 >60 mL/min   GFR calc Af Amer >60 >60 mL/min   Anion gap 10 5 - 15     Procedures:  Baseline: 125 Variability: moderate Accelerations: present Decelerations: absent Tocometry: none The patient was monitored for 30 minutes, fetal heart rate tracing was deemed reactive, category I tracing,  Discharge Condition: good  Disposition: Discharge disposition: 01-Home or Self Care       Diet: Regular diet and Cardiac diet  Discharge Activity: Activity as tolerated  Discharge Instructions    Discharge activity:  No Restrictions   Complete by:  As directed    Discharge diet:  No restrictions   Complete by:  As directed    Fetal Kick Count:  Lie on our left side for one hour after a meal, and count the number of times your baby kicks.  If it is less than 5 times, get up, move around and drink some juice.  Repeat the test 30 minutes later.  If it is still less than 5 kicks in an hour, notify your doctor.   Complete by:  As directed    LABOR:  When conractions begin, you should start to time them from the beginning of one contraction to the beginning  of the next.  When contractions are 5 - 10 minutes apart or less and have been regular for at least an hour, you should call your health care provider.   Complete by:  As directed    No sexual activity restrictions   Complete by:  As directed    Notify physician for bleeding from the vagina   Complete by:  As directed    Notify physician for blurring of vision or  spots before the eyes   Complete by:  As directed    Notify physician for chills or fever   Complete by:  As directed    Notify physician for fainting spells, "black outs" or loss of consciousness   Complete by:  As directed    Notify physician for increase in vaginal discharge   Complete by:  As directed    Notify physician for leaking of fluid   Complete by:  As directed    Notify physician for pain or burning when urinating   Complete by:  As directed    Notify physician for pelvic pressure (sudden increase)   Complete by:  As directed    Notify physician for severe or continued nausea or vomiting   Complete by:  As directed    Notify physician for sudden gushing of fluid from the vagina (with or without continued leaking)   Complete by:  As directed    Notify physician for sudden, constant, or occasional abdominal pain   Complete by:  As directed    Notify physician if baby moving less than usual   Complete by:  As directed      Allergies as of 12/13/2018   No Known Allergies     Medication List    TAKE these medications   multivitamin-prenatal 27-0.8 MG Tabs tablet Take 1 tablet by mouth daily at 12 noon.   sertraline 50 MG tablet Commonly known as:  ZOLOFT Take 1 tablet (50 mg total) by mouth daily.        Total time spent taking care of this patient: 30 minutes  Signed: Vena Austriandreas Hikari Tripp 12/13/2018, 2:13 AM

## 2018-12-15 ENCOUNTER — Ambulatory Visit (INDEPENDENT_AMBULATORY_CARE_PROVIDER_SITE_OTHER): Payer: Managed Care, Other (non HMO) | Admitting: Obstetrics and Gynecology

## 2018-12-15 VITALS — BP 126/88 | Wt 311.0 lb

## 2018-12-15 DIAGNOSIS — O09213 Supervision of pregnancy with history of pre-term labor, third trimester: Secondary | ICD-10-CM

## 2018-12-15 DIAGNOSIS — O99213 Obesity complicating pregnancy, third trimester: Secondary | ICD-10-CM

## 2018-12-15 DIAGNOSIS — F32A Depression, unspecified: Secondary | ICD-10-CM

## 2018-12-15 DIAGNOSIS — F329 Major depressive disorder, single episode, unspecified: Secondary | ICD-10-CM

## 2018-12-15 DIAGNOSIS — F418 Other specified anxiety disorders: Secondary | ICD-10-CM

## 2018-12-15 DIAGNOSIS — Z3A37 37 weeks gestation of pregnancy: Secondary | ICD-10-CM

## 2018-12-15 DIAGNOSIS — O99343 Other mental disorders complicating pregnancy, third trimester: Secondary | ICD-10-CM

## 2018-12-15 DIAGNOSIS — F419 Anxiety disorder, unspecified: Secondary | ICD-10-CM

## 2018-12-15 DIAGNOSIS — O0990 Supervision of high risk pregnancy, unspecified, unspecified trimester: Secondary | ICD-10-CM

## 2018-12-15 DIAGNOSIS — O99210 Obesity complicating pregnancy, unspecified trimester: Secondary | ICD-10-CM

## 2018-12-15 DIAGNOSIS — O163 Unspecified maternal hypertension, third trimester: Secondary | ICD-10-CM

## 2018-12-15 DIAGNOSIS — O09899 Supervision of other high risk pregnancies, unspecified trimester: Secondary | ICD-10-CM

## 2018-12-15 DIAGNOSIS — O09219 Supervision of pregnancy with history of pre-term labor, unspecified trimester: Secondary | ICD-10-CM

## 2018-12-15 LAB — POCT URINALYSIS DIPSTICK OB
GLUCOSE, UA: NEGATIVE
POC,PROTEIN,UA: NEGATIVE

## 2018-12-15 NOTE — Progress Notes (Signed)
ROB

## 2018-12-15 NOTE — Progress Notes (Signed)
Routine Prenatal Care Visit  Subjective  Terry Zhang is a 27 y.o. G2P0101 at [redacted]w[redacted]d being seen today for ongoing prenatal care.  She is currently monitored for the following issues for this high-risk pregnancy and has Anxiety and depression; Hyperlipidemia; Supervision of high risk pregnancy, antepartum; Obesity in pregnancy; History of preterm delivery, currently pregnant; Paroxysmal tachycardia (HCC); Indication for care in labor and delivery, antepartum; Gestational hypertension; and Elevated blood pressure affecting pregnancy in third trimester, antepartum on their problem list.  ----------------------------------------------------------------------------------- Patient reports no complaints.   Contractions: Irregular. Vag. Bleeding: None.  Movement: Present. Denies leaking of fluid.  ----------------------------------------------------------------------------------- The following portions of the patient's history were reviewed and updated as appropriate: allergies, current medications, past family history, past medical history, past social history, past surgical history and problem list. Problem list updated.   Objective  Blood pressure 126/88, weight (!) 311 lb (141.1 kg), last menstrual period 03/14/2018. Pregravid weight 272 lb (123.4 kg) Total Weight Gain 39 lb (17.7 kg) Urinalysis:      Fetal Status: Fetal Heart Rate (bpm): 125 Fundal Height: 42 cm Movement: Present     General:  Alert, oriented and cooperative. Patient is in no acute distress.  Skin: Skin is warm and dry. No rash noted.   Cardiovascular: Normal heart rate noted  Respiratory: Normal respiratory effort, no problems with respiration noted  Abdomen: Soft, gravid, appropriate for gestational age. Pain/Pressure: Present     Pelvic:  Cervical exam deferred        Extremities: Normal range of motion.     ental Status: Normal mood and affect. Normal behavior. Normal judgment and thought content.      Assessment   27 y.o. G2P0101 at [redacted]w[redacted]d by  01/04/2019, by Ultrasound presenting for routine prenatal visit  Plan   pregnancy 2  Problems (from 03/14/18 to present)    Problem Noted Resolved   Gestational hypertension 12/11/2018 by Conard Novak, MD No   Obesity in pregnancy 05/12/2018 by Farrel Conners, CNM No   Overview Addendum 10/15/2018 11:05 AM by Vena Austria, MD    BMI >=40 [X]  early 1h gtt - 93 [X]  bASA (>12 weeks) [ ]  Growth u/s 28 [X] , 32 [ ] , 36 weeks [ ]   - 28 weeks growth 10/13/18 1483g (3lbs 4oz) c/w 78.4%ile with AC 97.4% [ ]  NST/AFI weekly 36+ weeks (36[] , 37[] , 38[] , 39[] , 40[] ) [ ]  IOL by 41 weeks (scheduled, prn [] )       History of preterm delivery, currently pregnant 05/12/2018 by Farrel Conners, CNM No   Overview Addendum 07/21/2018  2:52 PM by Vena Austria, MD    Spontaneous labor and delivery of G1. Offered 17 P Baseline cervical length 4.12cm 07/21/2018      Supervision of high risk pregnancy, antepartum 05/06/2018 by Nadara Mustard, MD No   Overview Addendum 10/27/2018  5:49 PM by Natale Milch, MD    Clinic [facility] Prenatal Labs  Dating 6wk1d ultrasound Blood type: A/Positive/-- (05/08 1452)   Genetic Screen NIPS: Normal XX, Inheritest negative Antibody:Negative (05/08 1452)  Anatomic [place] Incomplete posterior fossa views [X]  24 week follow up suboptimal 4 chamber [X]  complete at 28 week growth Rubella: 1.36 (05/08 1452) Varicella: immune  GTT Early:93 Third trimester: 114 RPR: Non Reactive (05/08 1452)   Rhogam N/A HBsAg: Negative (05/08 1452)   TDaP vaccine    10/27/18    Flu Shot: 10/13/18 HIV: Non Reactive (05/08 1452)   Baby Food   Breast  GBS:   Contraception  vasectomy, would like BC to bridge to vasectomy Pap: 05/06/2017 NIL  CBB   Pelvis tested to 6lbs 5oz  CS/VBAC N/A   Support Person            Anxiety and depression 08/03/2015 by Doreene Nestlark, Katherine K, NP No   Overview Signed  07/21/2018  2:55 PM by Vena AustriaStaebler, Aquilla Shambley, MD    Zoloft Rx 50mg  daily 07/21/2018      Tachycardia 07/08/2018 by Conard NovakJackson, Stephen D, MD 07/21/2018 by Vena AustriaStaebler, Dashaun Onstott, MD       Gestational age appropriate obstetric precautions including but not limited to vaginal bleeding, contractions, leaking of fluid and fetal movement were reviewed in detail with the patient.    Return in about 1 week (around 12/22/2018) for ROB NST/AFI.  Vena AustriaAndreas Lekeisha Arenas, MD, Evern CoreFACOG [facility], [facility] 12/15/2018, 9:22 AM

## 2018-12-22 ENCOUNTER — Other Ambulatory Visit: Payer: Self-pay

## 2018-12-22 ENCOUNTER — Encounter: Payer: Managed Care, Other (non HMO) | Admitting: Advanced Practice Midwife

## 2018-12-22 ENCOUNTER — Inpatient Hospital Stay
Admission: EM | Admit: 2018-12-22 | Discharge: 2018-12-22 | Disposition: A | Discharge status: Home / Self Care | Payer: Managed Care, Other (non HMO) | Attending: Obstetrics and Gynecology | Admitting: Obstetrics and Gynecology

## 2018-12-22 ENCOUNTER — Encounter: Payer: Self-pay | Admitting: Advanced Practice Midwife

## 2018-12-22 ENCOUNTER — Ambulatory Visit: Payer: Managed Care, Other (non HMO)

## 2018-12-22 ENCOUNTER — Ambulatory Visit (INDEPENDENT_AMBULATORY_CARE_PROVIDER_SITE_OTHER): Payer: Managed Care, Other (non HMO)

## 2018-12-22 ENCOUNTER — Ambulatory Visit (INDEPENDENT_AMBULATORY_CARE_PROVIDER_SITE_OTHER): Payer: Managed Care, Other (non HMO) | Admitting: Advanced Practice Midwife

## 2018-12-22 VITALS — BP 132/80 | Wt 313.0 lb

## 2018-12-22 DIAGNOSIS — O133 Gestational [pregnancy-induced] hypertension without significant proteinuria, third trimester: Secondary | ICD-10-CM

## 2018-12-22 DIAGNOSIS — O99213 Obesity complicating pregnancy, third trimester: Secondary | ICD-10-CM

## 2018-12-22 DIAGNOSIS — O26893 Other specified pregnancy related conditions, third trimester: Secondary | ICD-10-CM | POA: Insufficient documentation

## 2018-12-22 DIAGNOSIS — Z3A38 38 weeks gestation of pregnancy: Secondary | ICD-10-CM

## 2018-12-22 DIAGNOSIS — O163 Unspecified maternal hypertension, third trimester: Secondary | ICD-10-CM

## 2018-12-22 DIAGNOSIS — O99210 Obesity complicating pregnancy, unspecified trimester: Secondary | ICD-10-CM

## 2018-12-22 DIAGNOSIS — R55 Syncope and collapse: Secondary | ICD-10-CM | POA: Diagnosis not present

## 2018-12-22 DIAGNOSIS — O09213 Supervision of pregnancy with history of pre-term labor, third trimester: Secondary | ICD-10-CM | POA: Diagnosis not present

## 2018-12-22 DIAGNOSIS — F32A Depression, unspecified: Secondary | ICD-10-CM

## 2018-12-22 DIAGNOSIS — O09219 Supervision of pregnancy with history of pre-term labor, unspecified trimester: Secondary | ICD-10-CM

## 2018-12-22 DIAGNOSIS — O09899 Supervision of other high risk pregnancies, unspecified trimester: Secondary | ICD-10-CM

## 2018-12-22 DIAGNOSIS — O0990 Supervision of high risk pregnancy, unspecified, unspecified trimester: Secondary | ICD-10-CM

## 2018-12-22 DIAGNOSIS — F329 Major depressive disorder, single episode, unspecified: Secondary | ICD-10-CM

## 2018-12-22 DIAGNOSIS — F419 Anxiety disorder, unspecified: Secondary | ICD-10-CM

## 2018-12-22 LAB — POCT URINALYSIS DIPSTICK OB
Glucose, UA: NEGATIVE
POC,PROTEIN,UA: NEGATIVE

## 2018-12-22 LAB — COMPREHENSIVE METABOLIC PANEL
ALT: 11 U/L (ref 0–44)
AST: 20 U/L (ref 15–41)
Albumin: 2.7 g/dL — ABNORMAL LOW (ref 3.5–5.0)
Alkaline Phosphatase: 111 U/L (ref 38–126)
Anion gap: 11 (ref 5–15)
BUN: 8 mg/dL (ref 6–20)
CO2: 17 mmol/L — ABNORMAL LOW (ref 22–32)
Calcium: 8.9 mg/dL (ref 8.9–10.3)
Chloride: 106 mmol/L (ref 98–111)
Creatinine, Ser: 0.47 mg/dL (ref 0.44–1.00)
GFR calc non Af Amer: >60 mL/min (ref >60)
Glucose, Bld: 115 mg/dL — ABNORMAL HIGH (ref 70–99)
Potassium: 3.5 mmol/L (ref 3.5–5.1)
SODIUM: 134 mmol/L — AB (ref 135–145)
Total Bilirubin: 0.5 mg/dL (ref 0.3–1.2)
Total Protein: 6.3 g/dL — ABNORMAL LOW (ref 6.5–8.1)

## 2018-12-22 LAB — CBC
HCT: 39.1 % (ref 36.0–46.0)
Hemoglobin: 12.7 g/dL (ref 12.0–15.0)
MCH: 28.1 pg (ref 26.0–34.0)
MCHC: 32.5 g/dL (ref 30.0–36.0)
MCV: 86.5 fL (ref 80.0–100.0)
PLATELETS: 178 10*3/uL (ref 150–400)
RBC: 4.52 MIL/uL (ref 3.87–5.11)
RDW: 15.8 % — ABNORMAL HIGH (ref 11.5–15.5)
WBC: 9.4 10*3/uL (ref 4.0–10.5)
nRBC: 0 % (ref 0.0–0.2)

## 2018-12-22 LAB — PROTEIN / CREATININE RATIO, URINE
Creatinine, Urine: 385 mg/dL
Protein Creatinine Ratio: 0.27 mg/mg{Cre} — ABNORMAL HIGH (ref 0.00–0.15)
TOTAL PROTEIN, URINE: 104 mg/dL

## 2018-12-22 MED ORDER — ACETAMINOPHEN 325 MG PO TABS
650.0000 mg | ORAL_TABLET | ORAL | Status: DC | PRN
  Administered 2018-12-22: 650 mg via ORAL
  Filled 2018-12-22: qty 2

## 2018-12-22 NOTE — Discharge Summary (Signed)
See final progress note. 

## 2018-12-22 NOTE — OB Triage Note (Signed)
Pt presents from office for "passing out" during NST.  Pt currently states that she "feels fine" and reports +FM.  Denies LOF or VB.  Dr. Bonney AidStaebler aware that pt c/o HA (3/10 on pain scale) that she has had "off and on" for the past 2 weeks with some nasal congestion.  Pt given tylenol.  Reflexes WNL, BLE 1+ pitting edema and non pitting edema in hands.  Labs sent.  Will continue to monitor

## 2018-12-22 NOTE — Progress Notes (Signed)
Routine Prenatal Care Visit  Subjective  Terry Zhang is a 27 y.o. G2P0101 at 6522w1d being seen today for ongoing prenatal care.  She is currently monitored for the following issues for this high-risk pregnancy and has Anxiety and depression; Hyperlipidemia; Supervision of high risk pregnancy, antepartum; Obesity in pregnancy; History of preterm delivery, currently pregnant; Paroxysmal tachycardia (HCC); Indication for care in labor and delivery, antepartum; Gestational hypertension; and Elevated blood pressure affecting pregnancy in third trimester, antepartum on their problem list.  ----------------------------------------------------------------------------------- Patient reports irregular cramping and contractions. She had syncopal episode while on NST. Laid her head back, cool cloth, ammonia inhalent. BP had dropped to 100/60. After a few minutes she began to feel better. She has a history of syncope but none so far this pregnancy. We had discussed her concerns about the size of this baby and that she wonders if she will be able to deliver vaginally given her history of 6 pound preterm delivery with vacuum assistance. Sending her to L&D for further monitoring given how she is feeling and barely reactive NST. IOL has been scheduled for next Tuesday 12/31 just after midnight on 12/30.    Contractions: Irregular. Vag. Bleeding: None.  Movement: Present. Denies leaking of fluid.  ----------------------------------------------------------------------------------- The following portions of the patient's history were reviewed and updated as appropriate: allergies, current medications, past family history, past medical history, past social history, past surgical history and problem list. Problem list updated.   Objective  Blood pressure 132/80, weight (!) 313 lb (142 kg), last menstrual period 03/14/2018. Pregravid weight 272 lb (123.4 kg) Total Weight Gain 41 lb (18.6 kg) Urinalysis: Urine  Protein Negative  Urine Glucose Negative  Fetal Status: Fetal Heart Rate (bpm): 130   Movement: Present, Presentation: Vertex AFI 11.1, NST is marginally reactive with baseline beginning at 130 and changing to 125, moderate variability, 10x10 accelerations with a few accelerations close to 15x15, -decelerations.  General:  Alert, oriented and cooperative. Patient is in no acute distress.  Skin: Skin is warm and dry. No rash noted.   Cardiovascular: Normal heart rate noted  Respiratory: Normal respiratory effort, no problems with respiration noted  Abdomen: Soft, gravid, appropriate for gestational age. Pain/Pressure: Present     Pelvic:  Cervical exam performed 1.5/60/-2  Extremities: Normal range of motion.  Edema: Trace  Mental Status: Normal mood and affect. Normal behavior. Normal judgment and thought content.   Assessment   27 y.o. G2P0101 at 4422w1d by  01/04/2019, by Ultrasound presenting for routine prenatal visit  Plan   pregnancy 2  Problems (from 03/14/18 to present)    Problem Noted Resolved   Gestational hypertension 12/11/2018 by Conard NovakJackson, Stephen D, MD No   Obesity in pregnancy 05/12/2018 by Farrel ConnersGutierrez, Colleen, CNM No   Overview Addendum 10/15/2018 11:05 AM by Vena AustriaStaebler, Andreas, MD    BMI >=40 [X]  early 1h gtt - 93 [X]  bASA (>12 weeks) [ ]  Growth u/s 28 [X] , 32 [ ] , 36 weeks [ ]   - 28 weeks growth 10/13/18 1483g (3lbs 4oz) c/w 78.4%ile with AC 97.4% [ ]  NST/AFI weekly 36+ weeks (36[] , 37[] , 38[] , 39[] , 40[] ) [ ]  IOL by 41 weeks (scheduled, prn [] )       History of preterm delivery, currently pregnant 05/12/2018 by Farrel ConnersGutierrez, Colleen, CNM No   Overview Addendum 07/21/2018  2:52 PM by Vena AustriaStaebler, Andreas, MD    Spontaneous labor and delivery of G1. Offered 17 P Baseline cervical length 4.12cm 07/21/2018      Supervision of high risk  pregnancy, antepartum 05/06/2018 by Nadara MustardHarris, Robert P, MD No   Overview Addendum 10/27/2018  5:49 PM by Natale MilchSchuman, Christanna R, MD    Clinic  [facility] Prenatal Labs  Dating 6wk1d ultrasound Blood type: A/Positive/-- (05/08 1452)   Genetic Screen NIPS: Normal XX, Inheritest negative Antibody:Negative (05/08 1452)  Anatomic US Incomplete posterior fossa views [X]  24 week follow up suboptimal 4 chamber [X]  complete at 28 week growth Rubella: 1.36 (05/08 1452) Varicella: immune  GTT Early:93 Third trimester: 114 RPR: Non Reactive (05/08 1452)   Rhogam N/A HBsAg: Negative (05/08 1452)   TDaP vaccine    10/27/18    Flu Shot: 10/13/18 HIV: Non Reactive (05/08 1452)   Baby Food   Breast                             GBS:   Contraception  vasectomy, would like BC to bridge to vasectomy Pap: 05/06/2017 NIL  CBB   Pelvis tested to 6lbs 5oz  CS/VBAC N/A   Support Person            Anxiety and depression 08/03/2015 by Doreene Nestlark, Katherine K, NP No   Overview Signed 07/21/2018  2:55 PM by Vena AustriaStaebler, Andreas, MD    Zoloft Rx 50mg  daily 07/21/2018      Tachycardia 07/08/2018 by Conard NovakJackson, Stephen D, MD 07/21/2018 by Vena AustriaStaebler, Andreas, MD       Term labor symptoms and general obstetric precautions including but not limited to vaginal bleeding, contractions, leaking of fluid and fetal movement were reviewed in detail with the patient. Please refer to After Visit Summary for other counseling recommendations.   Return in about 3 days (around 12/25/2018) for nst and rob.  Tresea MallJane Corbin Falck, CNM 12/22/2018 11:54 AM

## 2018-12-22 NOTE — Final Progress Note (Signed)
Physician Final Progress Note  Patient ID: Terry BaltimoreBrittany N Stults MRN: [id number] DOB/AGE: 1991-09-04 27 y.o.  Admit date: 12/22/2018 Admitting provider: Vena AustriaAndreas Danna Casella, MD Discharge date: 12/22/2018   Admission Diagnoses: Non-reactive office NST, syncopal episode  Discharge Diagnoses: Vasovagal episode  27 y.o. [id number] 5631w1d presenting from office after non-reactive office NST and syncopal episode.  Normotensive on presentation with normal labs.  Reactive NST.  Irregualr contractions.  No LOF, no VB,+FM  Temp:  [97.7 F (36.5 C)] 97.7 F (36.5 C) (12/24 1227) Pulse Rate:  [106-134] 106 (12/24 1327) Resp:  [20] 20 (12/24 1227) BP: (116-132)/(66-80) 128/78 (12/24 1327) Weight:  [142 kg] 142 kg (12/24 1227)  Baseline: 125 Variability: moderate Accelerations: present Decelerations: absent Tocometry: irregular, every 8 minutes The patient was monitored for 30 minutes, fetal heart rate tracing was deemed reactive, category I tracing,     Consults: None  Significant Findings/ Diagnostic Studies:  Results for orders placed or performed during the hospital encounter of 12/22/18 (from the past 24 hour(s))  Comprehensive metabolic panel     Status: Abnormal   Collection Time: 12/22/18 12:31 PM  Result Value Ref Range   Sodium 134 (L) 135 - 145 mmol/L   Potassium 3.5 3.5 - 5.1 mmol/L   Chloride 106 98 - 111 mmol/L   CO2 17 (L) 22 - 32 mmol/L   Glucose, Bld 115 (H) 70 - 99 mg/dL   BUN 8 6 - 20 mg/dL   Creatinine, Ser 4.130.47 0.44 - 1.00 mg/dL   Calcium 8.9 8.9 - 24.410.3 mg/dL   Total Protein 6.3 (L) 6.5 - 8.1 g/dL   Albumin 2.7 (L) 3.5 - 5.0 g/dL   AST 20 15 - 41 U/L   ALT 11 0 - 44 U/L   Alkaline Phosphatase 111 38 - 126 U/L   Total Bilirubin 0.5 0.3 - 1.2 mg/dL   GFR calc non Af Amer >60 >60 mL/min   GFR calc Af Amer >60 >60 mL/min   Anion gap 11 5 - 15  Protein / creatinine ratio, urine     Status: Abnormal   Collection Time: 12/22/18 12:31 PM  Result Value Ref Range   Creatinine, Urine 385 mg/dL   Total Protein, Urine 104 mg/dL   Protein Creatinine Ratio 0.27 (H) 0.00 - 0.15 mg/mg[Cre]  CBC on admission     Status: Abnormal   Collection Time: 12/22/18 12:31 PM  Result Value Ref Range   WBC 9.4 4.0 - 10.5 K/uL   RBC 4.52 3.87 - 5.11 MIL/uL   Hemoglobin 12.7 12.0 - 15.0 g/dL   HCT 01.039.1 27.236.0 - 53.646.0 %   MCV 86.5 80.0 - 100.0 fL   MCH 28.1 26.0 - 34.0 pg   MCHC 32.5 30.0 - 36.0 g/dL   RDW 64.415.8 (H) 03.411.5 - 74.215.5 %   Platelets 178 150 - 400 K/uL   nRBC 0.0 0.0 - 0.2 %     Procedures: NSt  Discharge Condition: good  Disposition: Discharge disposition: 01-Home or Self Care       Diet: Regular diet  Discharge Activity: Activity as tolerated  Discharge Instructions    Discharge activity:  No Restrictions   Complete by:  As directed    Discharge diet:  No restrictions   Complete by:  As directed    Fetal Kick Count:  Lie on our left side for one hour after a meal, and count the number of times your baby kicks.  If it is less than 5 times, get up, move  around and drink some juice.  Repeat the test 30 minutes later.  If it is still less than 5 kicks in an hour, notify your doctor.   Complete by:  As directed    LABOR:  When conractions begin, you should start to time them from the beginning of one contraction to the beginning  of the next.  When contractions are 5 - 10 minutes apart or less and have been regular for at least an hour, you should call your health care provider.   Complete by:  As directed    No sexual activity restrictions   Complete by:  As directed    Notify physician for bleeding from the vagina   Complete by:  As directed    Notify physician for blurring of vision or spots before the eyes   Complete by:  As directed    Notify physician for chills or fever   Complete by:  As directed    Notify physician for fainting spells, "black outs" or loss of consciousness   Complete by:  As directed    Notify physician for increase in  vaginal discharge   Complete by:  As directed    Notify physician for leaking of fluid   Complete by:  As directed    Notify physician for pain or burning when urinating   Complete by:  As directed    Notify physician for pelvic pressure (sudden increase)   Complete by:  As directed    Notify physician for severe or continued nausea or vomiting   Complete by:  As directed    Notify physician for sudden gushing of fluid from the vagina (with or without continued leaking)   Complete by:  As directed    Notify physician for sudden, constant, or occasional abdominal pain   Complete by:  As directed    Notify physician if baby moving less than usual   Complete by:  As directed      Allergies as of 12/22/2018   No Known Allergies     Medication List    TAKE these medications   multivitamin-prenatal 27-0.8 MG Tabs tablet Take 1 tablet by mouth daily at 12 noon.   sertraline 50 MG tablet Commonly known as:  ZOLOFT Take 1 tablet (50 mg total) by mouth daily.        Total time spent taking care of this patient: 30 minutes  Signed: Vena Austriandreas Elveria Lauderbaugh 12/22/2018, 2:07 PM

## 2018-12-25 ENCOUNTER — Ambulatory Visit (INDEPENDENT_AMBULATORY_CARE_PROVIDER_SITE_OTHER): Payer: Managed Care, Other (non HMO) | Admitting: Obstetrics and Gynecology

## 2018-12-25 ENCOUNTER — Encounter: Payer: Self-pay | Admitting: Obstetrics and Gynecology

## 2018-12-25 VITALS — BP 132/80 | Wt 311.0 lb

## 2018-12-25 DIAGNOSIS — O09213 Supervision of pregnancy with history of pre-term labor, third trimester: Secondary | ICD-10-CM

## 2018-12-25 DIAGNOSIS — O0990 Supervision of high risk pregnancy, unspecified, unspecified trimester: Secondary | ICD-10-CM

## 2018-12-25 DIAGNOSIS — O163 Unspecified maternal hypertension, third trimester: Secondary | ICD-10-CM | POA: Diagnosis not present

## 2018-12-25 DIAGNOSIS — O09899 Supervision of other high risk pregnancies, unspecified trimester: Secondary | ICD-10-CM

## 2018-12-25 DIAGNOSIS — Z3A38 38 weeks gestation of pregnancy: Secondary | ICD-10-CM

## 2018-12-25 DIAGNOSIS — O09219 Supervision of pregnancy with history of pre-term labor, unspecified trimester: Secondary | ICD-10-CM

## 2018-12-25 DIAGNOSIS — O99210 Obesity complicating pregnancy, unspecified trimester: Secondary | ICD-10-CM

## 2018-12-25 DIAGNOSIS — O99213 Obesity complicating pregnancy, third trimester: Secondary | ICD-10-CM

## 2018-12-25 LAB — POCT URINALYSIS DIPSTICK OB: Glucose, UA: NEGATIVE

## 2018-12-25 NOTE — Progress Notes (Signed)
ROB/NST- no concerns 

## 2018-12-25 NOTE — Progress Notes (Signed)
Routine Prenatal Care Visit  Subjective  Terry Zhang is a 27 y.o. G2P0101 at 7024w4d being seen today for ongoing prenatal care.  She is currently monitored for the following issues for this high-risk pregnancy and has Anxiety and depression; Hyperlipidemia; Supervision of high risk pregnancy, antepartum; Obesity in pregnancy; History of preterm delivery, currently pregnant; Paroxysmal tachycardia (HCC); Indication for care in labor and delivery, antepartum; Gestational hypertension; Elevated blood pressure affecting pregnancy in third trimester, antepartum; and Vasovagal episode on their problem list.  ----------------------------------------------------------------------------------- Patient reports no complaints.   Contractions: Irregular. Vag. Bleeding: None.  Movement: Present. Denies leaking of fluid.  ----------------------------------------------------------------------------------- The following portions of the patient's history were reviewed and updated as appropriate: allergies, current medications, past family history, past medical history, past social history, past surgical history and problem list. Problem list updated.   Objective  Blood pressure 132/80, weight (!) 311 lb (141.1 kg), last menstrual period 03/14/2018. Pregravid weight 272 lb (123.4 kg) Total Weight Gain 39 lb (17.7 kg) Urinalysis:      Fetal Status: Fetal Heart Rate (bpm): 125   Movement: Present     General:  Alert, oriented and cooperative. Patient is in no acute distress.  Skin: Skin is warm and dry. No rash noted.   Cardiovascular: Normal heart rate noted  Respiratory: Normal respiratory effort, no problems with respiration noted  Abdomen: Soft, gravid, appropriate for gestational age. Pain/Pressure: Present     Pelvic:  Cervical exam deferred        Extremities: Normal range of motion.     Mental Status: Normal mood and affect. Normal behavior. Normal judgment and thought content.   NST: 125  bpm baseline, moderate variability, 15x15 accelerations, no decelerations.  Assessment   27 y.o. G2P0101 at 1324w4d by  01/04/2019, by Ultrasound presenting for routine prenatal visit  Plan   pregnancy 2  Problems (from 03/14/18 to present)    Problem Noted Resolved   Gestational hypertension 12/11/2018 by Conard NovakJackson, Stephen D, MD No   Obesity in pregnancy 05/12/2018 by Farrel ConnersGutierrez, Colleen, CNM No   Overview Addendum 10/15/2018 11:05 AM by Vena AustriaStaebler, Andreas, MD    BMI >=40 [X]  early 1h gtt - 93 [X]  bASA (>12 weeks) [ ]  Growth u/s 28 [X] , 32 [ ] , 36 weeks [ ]   - 28 weeks growth 10/13/18 1483g (3lbs 4oz) c/w 78.4%ile with AC 97.4% [ ]  NST/AFI weekly 36+ weeks (36[] , 37[] , 38[] , 39[] , 40[] ) [ ]  IOL by 41 weeks (scheduled, prn [] )       History of preterm delivery, currently pregnant 05/12/2018 by Farrel ConnersGutierrez, Colleen, CNM No   Overview Addendum 07/21/2018  2:52 PM by Vena AustriaStaebler, Andreas, MD    Spontaneous labor and delivery of G1. Offered 17 P Baseline cervical length 4.12cm 07/21/2018      Supervision of high risk pregnancy, antepartum 05/06/2018 by Nadara MustardHarris, Robert P, MD No   Overview Addendum 10/27/2018  5:49 PM by Natale MilchSchuman, Christanna R, MD    Clinic [facility] Prenatal Labs  Dating 6wk1d ultrasound Blood type: A/Positive/-- (05/08 1452)   Genetic Screen NIPS: Normal XX, Inheritest negative Antibody:Negative (05/08 1452)  Anatomic US Incomplete posterior fossa views [X]  24 week follow up suboptimal 4 chamber [X]  complete at 28 week growth Rubella: 1.36 (05/08 1452) Varicella: immune  GTT Early:93 Third trimester: 114 RPR: Non Reactive (05/08 1452)   Rhogam N/A HBsAg: Negative (05/08 1452)   TDaP vaccine    10/27/18    Flu Shot: 10/13/18 HIV: Non Reactive (05/08 1452)  Baby Food   Breast                             GBS:   Contraception  vasectomy, would like BC to bridge to vasectomy Pap: 05/06/2017 NIL  CBB   Pelvis tested to 6lbs 5oz  CS/VBAC N/A   Support Person            Anxiety and  depression 08/03/2015 by Doreene Nestlark, Katherine K, NP No   Overview Signed 07/21/2018  2:55 PM by Vena AustriaStaebler, Andreas, MD    Zoloft Rx 50mg  daily 07/21/2018      Tachycardia 07/08/2018 by Conard NovakJackson, Stephen D, MD 07/21/2018 by Vena AustriaStaebler, Andreas, MD       Gestational age appropriate obstetric precautions including but not limited to vaginal bleeding, contractions, leaking of fluid and fetal movement were reviewed in detail with the patient.    IOL planned for Sunday at midnight, discussed inductions with patient Growth >99% pushed for more than 5 hours with her last pregnancy.  Return if symptoms worsen or fail to improve.  Natale Milchhristanna R Schuman MD [facility], [facility] 12/25/2018, 4:09 PM

## 2018-12-27 ENCOUNTER — Other Ambulatory Visit: Payer: Self-pay

## 2018-12-27 ENCOUNTER — Inpatient Hospital Stay: Payer: Managed Care, Other (non HMO) | Admitting: Anesthesiology

## 2018-12-27 ENCOUNTER — Inpatient Hospital Stay
Admission: EM | Admit: 2018-12-27 | Discharge: 2018-12-30 | DRG: 787 | Disposition: A | Discharge status: Home / Self Care | Payer: Managed Care, Other (non HMO) | Attending: Certified Nurse Midwife | Admitting: Certified Nurse Midwife

## 2018-12-27 DIAGNOSIS — Z87891 Personal history of nicotine dependence: Secondary | ICD-10-CM

## 2018-12-27 DIAGNOSIS — O99344 Other mental disorders complicating childbirth: Secondary | ICD-10-CM | POA: Diagnosis present

## 2018-12-27 DIAGNOSIS — F32A Depression, unspecified: Secondary | ICD-10-CM

## 2018-12-27 DIAGNOSIS — O99214 Obesity complicating childbirth: Secondary | ICD-10-CM | POA: Diagnosis present

## 2018-12-27 DIAGNOSIS — O4202 Full-term premature rupture of membranes, onset of labor within 24 hours of rupture: Secondary | ICD-10-CM | POA: Diagnosis not present

## 2018-12-27 DIAGNOSIS — O4290 Premature rupture of membranes, unspecified as to length of time between rupture and onset of labor, unspecified weeks of gestation: Secondary | ICD-10-CM

## 2018-12-27 DIAGNOSIS — D62 Acute posthemorrhagic anemia: Secondary | ICD-10-CM | POA: Diagnosis not present

## 2018-12-27 DIAGNOSIS — O09219 Supervision of pregnancy with history of pre-term labor, unspecified trimester: Secondary | ICD-10-CM

## 2018-12-27 DIAGNOSIS — O3663X0 Maternal care for excessive fetal growth, third trimester, not applicable or unspecified: Secondary | ICD-10-CM | POA: Diagnosis present

## 2018-12-27 DIAGNOSIS — O9081 Anemia of the puerperium: Secondary | ICD-10-CM | POA: Diagnosis not present

## 2018-12-27 DIAGNOSIS — Z98891 History of uterine scar from previous surgery: Secondary | ICD-10-CM

## 2018-12-27 DIAGNOSIS — O99210 Obesity complicating pregnancy, unspecified trimester: Secondary | ICD-10-CM

## 2018-12-27 DIAGNOSIS — F329 Major depressive disorder, single episode, unspecified: Secondary | ICD-10-CM

## 2018-12-27 DIAGNOSIS — Z3A38 38 weeks gestation of pregnancy: Secondary | ICD-10-CM

## 2018-12-27 DIAGNOSIS — O3660X0 Maternal care for excessive fetal growth, unspecified trimester, not applicable or unspecified: Secondary | ICD-10-CM | POA: Diagnosis present

## 2018-12-27 DIAGNOSIS — O133 Gestational [pregnancy-induced] hypertension without significant proteinuria, third trimester: Secondary | ICD-10-CM

## 2018-12-27 DIAGNOSIS — Z3A39 39 weeks gestation of pregnancy: Secondary | ICD-10-CM | POA: Diagnosis not present

## 2018-12-27 DIAGNOSIS — O42913 Preterm premature rupture of membranes, unspecified as to length of time between rupture and onset of labor, third trimester: Principal | ICD-10-CM | POA: Diagnosis present

## 2018-12-27 DIAGNOSIS — O09899 Supervision of other high risk pregnancies, unspecified trimester: Secondary | ICD-10-CM

## 2018-12-27 DIAGNOSIS — F419 Anxiety disorder, unspecified: Secondary | ICD-10-CM

## 2018-12-27 DIAGNOSIS — O0990 Supervision of high risk pregnancy, unspecified, unspecified trimester: Secondary | ICD-10-CM

## 2018-12-27 HISTORY — DX: Premature rupture of membranes, unspecified as to length of time between rupture and onset of labor, unspecified weeks of gestation: O42.90

## 2018-12-27 LAB — CBC
HCT: 39.2 % (ref 36.0–46.0)
Hemoglobin: 12.7 g/dL (ref 12.0–15.0)
MCH: 27.9 pg (ref 26.0–34.0)
MCHC: 32.4 g/dL (ref 30.0–36.0)
MCV: 86.2 fL (ref 80.0–100.0)
Platelets: 170 10*3/uL (ref 150–400)
RBC: 4.55 MIL/uL (ref 3.87–5.11)
RDW: 15.6 % — ABNORMAL HIGH (ref 11.5–15.5)
WBC: 8.9 10*3/uL (ref 4.0–10.5)
nRBC: 0 % (ref 0.0–0.2)

## 2018-12-27 LAB — TYPE AND SCREEN
ABO/RH(D): A POS
Antibody Screen: NEGATIVE

## 2018-12-27 MED ORDER — ACETAMINOPHEN 325 MG PO TABS
650.0000 mg | ORAL_TABLET | ORAL | Status: DC | PRN
  Administered 2018-12-28: 650 mg via ORAL
  Filled 2018-12-27: qty 2

## 2018-12-27 MED ORDER — FENTANYL 2.5 MCG/ML W/ROPIVACAINE 0.15% IN NS 100 ML EPIDURAL (ARMC)
EPIDURAL | Status: AC
  Filled 2018-12-27: qty 100

## 2018-12-27 MED ORDER — LACTATED RINGERS IV SOLN
INTRAVENOUS | Status: DC
  Administered 2018-12-28: 04:00:00 via INTRAVENOUS

## 2018-12-27 MED ORDER — LACTATED RINGERS IV SOLN: 500.0000 mL | INTRAVENOUS | Status: DC | PRN

## 2018-12-27 MED ORDER — ONDANSETRON HCL 4 MG/2ML IJ SOLN
4.0000 mg | Freq: Four times a day (QID) | INTRAMUSCULAR | Status: DC | PRN
  Administered 2018-12-28: 4 mg via INTRAVENOUS
  Filled 2018-12-27: qty 2

## 2018-12-27 MED ORDER — FENTANYL CITRATE (PF) 100 MCG/2ML IJ SOLN: 50.0000 ug | INTRAMUSCULAR | Status: DC | PRN

## 2018-12-27 MED ORDER — OXYTOCIN 40 UNITS IN LACTATED RINGERS INFUSION - SIMPLE MED
2.5000 [IU]/h | INTRAVENOUS | Status: DC
  Administered 2018-12-28: 1000 mL via INTRAVENOUS
  Filled 2018-12-27 (×2): qty 1000

## 2018-12-27 MED ORDER — OXYTOCIN BOLUS FROM INFUSION
500.0000 mL | Freq: Once | INTRAVENOUS | Status: DC
  Administered 2018-12-28: 500 mL via INTRAVENOUS

## 2018-12-27 NOTE — H&P (Signed)
Obstetrics Admission History & Physical   Premature rupture of membranes  HPI:  27 y.o. G2P0101 @ 7626w6d (01/04/2019, by Ultrasound). Admitted on 12/27/2018:   Patient Active Problem List   Diagnosis Date Noted  . PROM (premature rupture of membranes) 12/27/2018  . Vasovagal episode 12/22/2018  . Elevated blood pressure affecting pregnancy in third trimester, antepartum 12/13/2018  . Gestational hypertension 12/11/2018  . Indication for care in labor and delivery, antepartum 12/08/2018  . Paroxysmal tachycardia (HCC) 07/10/2018  . Obesity in pregnancy 05/12/2018  . History of preterm delivery, currently pregnant 05/12/2018  . Supervision of high risk pregnancy, antepartum 05/06/2018  . Hyperlipidemia 02/23/2016  . Anxiety and depression 08/03/2015     Presents for a large gush of fluid at home at 2030. She has been having contractions which have increased in frequency and intensity since ROM.  She denies vaginal bleeding. Her baby is moving well.  [facility] Pregnancy complicated by obesity .  ROS: A review of systems was performed and negative, except as stated in the above HPI.  PMHx:  Past Medical History:  Diagnosis Date  . Anxiety   . Depression   . Hyperlipidemia 02/23/2016  . Obesity    PSHx:  Past Surgical History:  Procedure Laterality Date  . ANKLE SURGERY    . BREAST BIOPSY  2003  . TONSILLECTOMY     Medications:  Medications Prior to Admission  Medication Sig Dispense Refill Last Dose  . Prenatal Vit-Fe Fumarate-FA (MULTIVITAMIN-PRENATAL) 27-0.8 MG TABS tablet Take 1 tablet by mouth daily at 12 noon.   12/26/2018 at Unknown time  . sertraline (ZOLOFT) 50 MG tablet Take 1 tablet (50 mg total) by mouth daily. 90 tablet 3 12/26/2018 at Unknown time   Allergies: has No Known Allergies. OBHx:  OB History  Gravida Para Term Preterm AB Living  2 1 0 1   1  SAB TAB Ectopic Multiple Live Births          1    # Outcome Date GA Lbr Len/2nd  Weight Sex Delivery Anes PTL Lv  2 Current           1 Preterm 06/15/09 847w4d  2863 g F Vag-Vacuum EPI Y LIV    Obstetric Comments  Had a VAVD for FHR decelerations, ?OP presentation    Family History  Problem Relation Age of Onset  . Arthritis Mother   . Cancer Mother [id number]       cervical  . Hyperlipidemia Mother   . Mental illness Mother   . Diabetes Mother   . Hyperlipidemia Father   . Hypertension Father   . Mental illness Father   . Mental illness Maternal Grandfather   . Diabetes Maternal Grandfather   . Pancreatic cancer Maternal Grandmother [id number]    Soc Hx: Former smoker, Alcohol: none and Recreational drug use: none  Objective:  Blood pressure: 138/80, Pulse 108  Constitutional: Well nourished, well developed female in no acute distress.  HEENT: normal Skin: Warm and dry.  Cardiovascular: Regular rate and rhythm, no murmurs, rubs, or gallops.   Extremity: trace to 1+ bilateral pedal edema Respiratory: Clear to auscultation bilaterally. Normal respiratory effort Abdomen: gravid, non-tender Neuro: Cranial nerves grossly intact Psych: Alert and Oriented x3. No memory deficits. Normal mood and affect.  MS: normal gait, normal bilateral lower extremity ROM/strength/stability.  Pelvic exam: 3/70-2 (RN exam)  EFM:FHR: 130 bpm, variability: moderate,  accelerations:  Present,  decelerations:  Absent Toco: Frequency: Every 2.5-4 minutes,  Duration: 40-60 seconds and Intensity: mild   Perinatal info:  Blood type: A positive Rubella - Immune Varicella - Immune TDaP Given during third trimester of this pregnancy RPR NR / HIV Neg/ HBsAg Neg   Assessment & Plan:   27 y.o. G2P0101 @ 3032w6d, Admitted on 12/27/2018 for premature rupture of membranes, contractions, early labor.    Admit for labor, Observe for cervical change, Fetal Wellbeing Reassuring, Epidural when ready, AROM when Appropriate and GBS status negative, treat as needed  Marcelyn BruinsJacelyn , CNM [facility],  [facility] 12/27/2018

## 2018-12-28 ENCOUNTER — Encounter
Admission: EM | Disposition: A | Discharge status: Home / Self Care | Payer: Self-pay | Source: Home / Self Care | Attending: Certified Nurse Midwife

## 2018-12-28 ENCOUNTER — Encounter: Payer: Self-pay | Admitting: Anesthesiology

## 2018-12-28 DIAGNOSIS — O3660X0 Maternal care for excessive fetal growth, unspecified trimester, not applicable or unspecified: Secondary | ICD-10-CM

## 2018-12-28 DIAGNOSIS — IMO0002 Reserved for concepts with insufficient information to code with codable children: Secondary | ICD-10-CM

## 2018-12-28 DIAGNOSIS — O3663X0 Maternal care for excessive fetal growth, third trimester, not applicable or unspecified: Secondary | ICD-10-CM

## 2018-12-28 DIAGNOSIS — O4202 Full-term premature rupture of membranes, onset of labor within 24 hours of rupture: Secondary | ICD-10-CM

## 2018-12-28 DIAGNOSIS — Z3A39 39 weeks gestation of pregnancy: Secondary | ICD-10-CM

## 2018-12-28 HISTORY — DX: Maternal care for excessive fetal growth, unspecified trimester, not applicable or unspecified: O36.60X0

## 2018-12-28 HISTORY — DX: Reserved for concepts with insufficient information to code with codable children: IMO0002

## 2018-12-28 LAB — CBC
HCT: 34.7 % — ABNORMAL LOW (ref 36.0–46.0)
Hemoglobin: 11.3 g/dL — ABNORMAL LOW (ref 12.0–15.0)
MCH: 28.1 pg (ref 26.0–34.0)
MCHC: 32.6 g/dL (ref 30.0–36.0)
MCV: 86.3 fL (ref 80.0–100.0)
NRBC: 0 % (ref 0.0–0.2)
Platelets: 134 10*3/uL — ABNORMAL LOW (ref 150–400)
RBC: 4.02 MIL/uL (ref 3.87–5.11)
RDW: 15.7 % — ABNORMAL HIGH (ref 11.5–15.5)
WBC: 14.3 10*3/uL — AB (ref 4.0–10.5)

## 2018-12-28 LAB — CREATININE, SERUM
Creatinine, Ser: 0.43 mg/dL — ABNORMAL LOW (ref 0.44–1.00)
GFR calc Af Amer: >60 mL/min (ref >60)

## 2018-12-28 SURGERY — Surgical Case
Anesthesia: Epidural

## 2018-12-28 MED ORDER — LACTATED RINGERS IV SOLN
INTRAVENOUS | Status: DC
  Administered 2018-12-29: 05:00:00 via INTRAVENOUS

## 2018-12-28 MED ORDER — OXYCODONE-ACETAMINOPHEN 5-325 MG PO TABS
2.0000 | ORAL_TABLET | ORAL | Status: DC | PRN
  Administered 2018-12-29 (×2): 2 via ORAL
  Filled 2018-12-28 (×2): qty 2

## 2018-12-28 MED ORDER — NALBUPHINE HCL 10 MG/ML IJ SOLN: 5.0000 mg | Freq: Once | INTRAMUSCULAR | Status: DC | PRN

## 2018-12-28 MED ORDER — ENOXAPARIN SODIUM 40 MG/0.4ML ~~LOC~~ SOLN: 40.0000 mg | Freq: Two times a day (BID) | SUBCUTANEOUS | Status: DC

## 2018-12-28 MED ORDER — KETOROLAC TROMETHAMINE 30 MG/ML IJ SOLN
INTRAMUSCULAR | Status: DC | PRN
  Administered 2018-12-28: 30 mg via INTRAVENOUS

## 2018-12-28 MED ORDER — FERROUS SULFATE 325 (65 FE) MG PO TABS
325.0000 mg | ORAL_TABLET | Freq: Two times a day (BID) | ORAL | Status: DC
  Administered 2018-12-28 – 2018-12-30 (×4): 325 mg via ORAL
  Filled 2018-12-28 (×4): qty 1

## 2018-12-28 MED ORDER — ACETAMINOPHEN 10 MG/ML IV SOLN
INTRAVENOUS | Status: AC
  Filled 2018-12-28: qty 100

## 2018-12-28 MED ORDER — MENTHOL 3 MG MT LOZG
1.0000 | LOZENGE | OROMUCOSAL | Status: DC | PRN
  Filled 2018-12-28: qty 9

## 2018-12-28 MED ORDER — ONDANSETRON HCL 4 MG/2ML IJ SOLN: 4.0000 mg | Freq: Three times a day (TID) | INTRAMUSCULAR | Status: DC | PRN

## 2018-12-28 MED ORDER — SODIUM CHLORIDE 0.9% FLUSH: 3.0000 mL | INTRAVENOUS | Status: DC | PRN

## 2018-12-28 MED ORDER — LIDOCAINE 2% (20 MG/ML) 5 ML SYRINGE
INTRAMUSCULAR | Status: DC | PRN
  Administered 2018-12-28 (×4): 100 mg via INTRAVENOUS

## 2018-12-28 MED ORDER — BUPIVACAINE 0.25 % ON-Q PUMP DUAL CATH 400 ML
400.0000 mL | INJECTION | Status: DC
  Filled 2018-12-28: qty 400

## 2018-12-28 MED ORDER — NALBUPHINE HCL 10 MG/ML IJ SOLN: 5.0000 mg | INTRAMUSCULAR | Status: DC | PRN

## 2018-12-28 MED ORDER — SIMETHICONE 80 MG PO CHEW
80.0000 mg | CHEWABLE_TABLET | Freq: Three times a day (TID) | ORAL | Status: DC
  Administered 2018-12-28 – 2018-12-30 (×5): 80 mg via ORAL
  Filled 2018-12-28 (×5): qty 1

## 2018-12-28 MED ORDER — EPHEDRINE 5 MG/ML INJ
10.0000 mg | INTRAVENOUS | Status: DC | PRN
  Filled 2018-12-28: qty 4

## 2018-12-28 MED ORDER — KETOROLAC TROMETHAMINE 30 MG/ML IJ SOLN
INTRAMUSCULAR | Status: AC
  Filled 2018-12-28: qty 1

## 2018-12-28 MED ORDER — NALOXONE HCL 4 MG/10ML IJ SOLN
1.0000 ug/kg/h | INTRAVENOUS | Status: DC | PRN
  Filled 2018-12-28: qty 5

## 2018-12-28 MED ORDER — BUPIVACAINE HCL (PF) 0.25 % IJ SOLN
INTRAMUSCULAR | Status: DC | PRN
  Administered 2018-12-27: 5 mL via EPIDURAL

## 2018-12-28 MED ORDER — OXYTOCIN 10 UNIT/ML IJ SOLN
INTRAMUSCULAR | Status: AC
  Filled 2018-12-28: qty 2

## 2018-12-28 MED ORDER — MORPHINE SULFATE-NACL 0.5-0.9 MG/ML-% IV SOSY
PREFILLED_SYRINGE | INTRAVENOUS | Status: DC | PRN
  Administered 2018-12-28: 3 mg via EPIDURAL

## 2018-12-28 MED ORDER — SODIUM CHLORIDE 0.9 % IV SOLN
500.0000 mg | Freq: Once | INTRAVENOUS | Status: AC
  Administered 2018-12-28: 500 mg via INTRAVENOUS
  Filled 2018-12-28: qty 500

## 2018-12-28 MED ORDER — SERTRALINE HCL 25 MG PO TABS: 50.0000 mg | ORAL_TABLET | Freq: Every day | ORAL | Status: DC

## 2018-12-28 MED ORDER — KETOROLAC TROMETHAMINE 30 MG/ML IJ SOLN: 30.0000 mg | Freq: Four times a day (QID) | INTRAMUSCULAR | Status: DC

## 2018-12-28 MED ORDER — FENTANYL CITRATE (PF) 100 MCG/2ML IJ SOLN
INTRAMUSCULAR | Status: AC
  Filled 2018-12-28: qty 2

## 2018-12-28 MED ORDER — SOD CITRATE-CITRIC ACID 500-334 MG/5ML PO SOLN
ORAL | Status: AC
  Administered 2018-12-28: 30 mL via ORAL
  Filled 2018-12-28: qty 15

## 2018-12-28 MED ORDER — KETOROLAC TROMETHAMINE 30 MG/ML IJ SOLN
30.0000 mg | Freq: Once | INTRAMUSCULAR | Status: AC
  Administered 2018-12-28: 30 mg via INTRAVENOUS
  Filled 2018-12-28: qty 1

## 2018-12-28 MED ORDER — SOD CITRATE-CITRIC ACID 500-334 MG/5ML PO SOLN
30.0000 mL | ORAL | Status: AC
  Administered 2018-12-28: 30 mL via ORAL

## 2018-12-28 MED ORDER — DIPHENHYDRAMINE HCL 25 MG PO CAPS: 25.0000 mg | ORAL_CAPSULE | ORAL | Status: DC | PRN

## 2018-12-28 MED ORDER — OXYCODONE-ACETAMINOPHEN 5-325 MG PO TABS: 1.0000 | ORAL_TABLET | ORAL | Status: DC | PRN

## 2018-12-28 MED ORDER — COCONUT OIL OIL: 1.0000 "application " | TOPICAL_OIL | Status: DC | PRN

## 2018-12-28 MED ORDER — PHENYLEPHRINE 40 MCG/ML (10ML) SYRINGE FOR IV PUSH (FOR BLOOD PRESSURE SUPPORT): 80.0000 ug | PREFILLED_SYRINGE | INTRAVENOUS | Status: DC | PRN

## 2018-12-28 MED ORDER — SCOPOLAMINE 1 MG/3DAYS TD PT72
1.0000 | MEDICATED_PATCH | Freq: Once | TRANSDERMAL | Status: DC
  Administered 2018-12-28: 1.5 mg via TRANSDERMAL
  Filled 2018-12-28: qty 1

## 2018-12-28 MED ORDER — DIPHENHYDRAMINE HCL 50 MG/ML IJ SOLN: 12.5000 mg | INTRAMUSCULAR | Status: DC | PRN

## 2018-12-28 MED ORDER — DEXTROSE 5 % IV SOLN
3.0000 g | INTRAVENOUS | Status: AC
  Administered 2018-12-28: 3 g via INTRAVENOUS
  Filled 2018-12-28: qty 3000

## 2018-12-28 MED ORDER — ACETAMINOPHEN 10 MG/ML IV SOLN
INTRAVENOUS | Status: DC | PRN
  Administered 2018-12-28: 1000 mg via INTRAVENOUS

## 2018-12-28 MED ORDER — PRENATAL MULTIVITAMIN CH
1.0000 | ORAL_TABLET | Freq: Every day | ORAL | Status: DC
  Administered 2018-12-29: 1 via ORAL
  Filled 2018-12-28: qty 1

## 2018-12-28 MED ORDER — MEPERIDINE HCL 25 MG/ML IJ SOLN: 6.2500 mg | INTRAMUSCULAR | Status: DC | PRN

## 2018-12-28 MED ORDER — OXYTOCIN 40 UNITS IN LACTATED RINGERS INFUSION - SIMPLE MED
2.5000 [IU]/h | INTRAVENOUS | Status: AC
  Filled 2018-12-28: qty 1000

## 2018-12-28 MED ORDER — PHENYLEPHRINE 40 MCG/ML (10ML) SYRINGE FOR IV PUSH (FOR BLOOD PRESSURE SUPPORT)
PREFILLED_SYRINGE | INTRAVENOUS | Status: DC | PRN
  Administered 2018-12-28: 100 ug via INTRAVENOUS

## 2018-12-28 MED ORDER — LACTATED RINGERS IV SOLN: 500.0000 mL | Freq: Once | INTRAVENOUS | Status: DC

## 2018-12-28 MED ORDER — OXYCODONE-ACETAMINOPHEN 5-325 MG PO TABS: 2.0000 | ORAL_TABLET | ORAL | Status: DC | PRN

## 2018-12-28 MED ORDER — AMMONIA AROMATIC IN INHA
RESPIRATORY_TRACT | Status: AC
  Filled 2018-12-28: qty 10

## 2018-12-28 MED ORDER — LIDOCAINE HCL (PF) 2 % IJ SOLN
INTRAMUSCULAR | Status: AC
  Filled 2018-12-28: qty 40

## 2018-12-28 MED ORDER — MORPHINE SULFATE (PF) 0.5 MG/ML IJ SOLN
INTRAMUSCULAR | Status: AC
  Filled 2018-12-28: qty 10

## 2018-12-28 MED ORDER — FENTANYL CITRATE (PF) 100 MCG/2ML IJ SOLN
INTRAMUSCULAR | Status: DC | PRN
  Administered 2018-12-28: 25 ug via INTRAVENOUS
  Administered 2018-12-28: 50 ug via INTRAVENOUS
  Administered 2018-12-28: 25 ug via INTRAVENOUS
  Administered 2018-12-28 (×2): 50 ug via INTRAVENOUS
  Administered 2018-12-28: 100 ug via EPIDURAL

## 2018-12-28 MED ORDER — BUPIVACAINE HCL (PF) 0.5 % IJ SOLN: 10.0000 mL | Freq: Once | INTRAMUSCULAR | Status: DC

## 2018-12-28 MED ORDER — IBUPROFEN 600 MG PO TABS
600.0000 mg | ORAL_TABLET | Freq: Four times a day (QID) | ORAL | Status: DC
  Administered 2018-12-29 – 2018-12-30 (×4): 600 mg via ORAL
  Filled 2018-12-28 (×5): qty 1

## 2018-12-28 MED ORDER — ENOXAPARIN SODIUM 40 MG/0.4ML ~~LOC~~ SOLN
40.0000 mg | SUBCUTANEOUS | Status: DC
  Administered 2018-12-28 – 2018-12-29 (×2): 40 mg via SUBCUTANEOUS
  Filled 2018-12-28 (×2): qty 0.4

## 2018-12-28 MED ORDER — DIPHENHYDRAMINE HCL 25 MG PO CAPS: 25.0000 mg | ORAL_CAPSULE | Freq: Four times a day (QID) | ORAL | Status: DC | PRN

## 2018-12-28 MED ORDER — ACETAMINOPHEN 500 MG PO TABS: 1000.0000 mg | ORAL_TABLET | Freq: Four times a day (QID) | ORAL | Status: DC

## 2018-12-28 MED ORDER — DIBUCAINE 1 % RE OINT: 1.0000 "application " | TOPICAL_OINTMENT | RECTAL | Status: DC | PRN

## 2018-12-28 MED ORDER — SERTRALINE HCL 25 MG PO TABS
50.0000 mg | ORAL_TABLET | Freq: Every day | ORAL | Status: DC
  Administered 2018-12-28 – 2018-12-29 (×2): 50 mg via ORAL
  Filled 2018-12-28 (×2): qty 2

## 2018-12-28 MED ORDER — LIDOCAINE HCL (PF) 1 % IJ SOLN
INTRAMUSCULAR | Status: AC
  Filled 2018-12-28: qty 30

## 2018-12-28 MED ORDER — BUPIVACAINE HCL (PF) 0.5 % IJ SOLN
INTRAMUSCULAR | Status: AC
  Filled 2018-12-28: qty 30

## 2018-12-28 MED ORDER — CHLOROPROCAINE HCL (PF) 3 % IJ SOLN
INTRAMUSCULAR | Status: AC
  Filled 2018-12-28: qty 20

## 2018-12-28 MED ORDER — CHLOROPROCAINE HCL (PF) 3 % IJ SOLN
INTRAMUSCULAR | Status: DC | PRN
  Administered 2018-12-28: 5 mL

## 2018-12-28 MED ORDER — SENNOSIDES-DOCUSATE SODIUM 8.6-50 MG PO TABS
2.0000 | ORAL_TABLET | ORAL | Status: DC
  Administered 2018-12-30: 2 via ORAL
  Filled 2018-12-28: qty 2

## 2018-12-28 MED ORDER — MISOPROSTOL 200 MCG PO TABS
ORAL_TABLET | ORAL | Status: AC
  Filled 2018-12-28: qty 4

## 2018-12-28 MED ORDER — NALOXONE HCL 0.4 MG/ML IJ SOLN: 0.4000 mg | INTRAMUSCULAR | Status: DC | PRN

## 2018-12-28 MED ORDER — OXYCODONE-ACETAMINOPHEN 5-325 MG PO TABS
1.0000 | ORAL_TABLET | ORAL | Status: DC | PRN
  Administered 2018-12-29 – 2018-12-30 (×6): 1 via ORAL
  Filled 2018-12-28 (×6): qty 1

## 2018-12-28 MED ORDER — EPHEDRINE 5 MG/ML INJ
10.0000 mg | INTRAVENOUS | Status: DC | PRN
  Administered 2018-12-28: 5 mg via INTRAVENOUS

## 2018-12-28 MED ORDER — FENTANYL 2.5 MCG/ML W/ROPIVACAINE 0.15% IN NS 100 ML EPIDURAL (ARMC)
12.0000 mL/h | EPIDURAL | Status: DC
  Administered 2018-12-27 – 2018-12-28 (×2): 12 mL/h via EPIDURAL
  Filled 2018-12-28: qty 100

## 2018-12-28 MED ORDER — WITCH HAZEL-GLYCERIN EX PADS: 1.0000 "application " | MEDICATED_PAD | CUTANEOUS | Status: DC | PRN

## 2018-12-28 SURGICAL SUPPLY — 33 items
CANISTER SUCT 3000ML PPV (MISCELLANEOUS) ×3 IMPLANT
CATH KIT ON-Q SILVERSOAK 5IN (CATHETERS) IMPLANT
CLOSURE WOUND 1/2 X4 (GAUZE/BANDAGES/DRESSINGS) ×1
COVER WAND RF STERILE (DRAPES) IMPLANT
DERMABOND ADVANCED (GAUZE/BANDAGES/DRESSINGS) ×2
DERMABOND ADVANCED .7 DNX12 (GAUZE/BANDAGES/DRESSINGS) ×1 IMPLANT
DRSG OPSITE POSTOP 4X10 (GAUZE/BANDAGES/DRESSINGS) ×3 IMPLANT
DRSG TELFA 3X8 NADH (GAUZE/BANDAGES/DRESSINGS) ×3 IMPLANT
ELECT CAUTERY BLADE 6.4 (BLADE) ×3 IMPLANT
ELECT REM PT RETURN 9FT ADLT (ELECTROSURGICAL) ×3
ELECTRODE REM PT RTRN 9FT ADLT (ELECTROSURGICAL) ×1 IMPLANT
GAUZE SPONGE 4X4 12PLY STRL (GAUZE/BANDAGES/DRESSINGS) ×3 IMPLANT
GLOVE BIO SURGEON STRL SZ7 (GLOVE) ×9 IMPLANT
GLOVE INDICATOR 7.5 STRL GRN (GLOVE) ×9 IMPLANT
GOWN STRL REUS W/ TWL LRG LVL3 (GOWN DISPOSABLE) ×3 IMPLANT
GOWN STRL REUS W/TWL LRG LVL3 (GOWN DISPOSABLE) ×6
NS IRRIG 1000ML POUR BTL (IV SOLUTION) ×3 IMPLANT
PACK C SECTION AR (MISCELLANEOUS) ×3 IMPLANT
PAD OB MATERNITY 4.3X12.25 (PERSONAL CARE ITEMS) ×3 IMPLANT
PAD PREP 24X41 OB/GYN DISP (PERSONAL CARE ITEMS) ×3 IMPLANT
RETRACTOR TRAXI PANNICULUS (MISCELLANEOUS) ×1 IMPLANT
RTRCTR C-SECT PINK 34CM XLRG (MISCELLANEOUS) ×3 IMPLANT
STRIP CLOSURE SKIN 1/2X4 (GAUZE/BANDAGES/DRESSINGS) ×2 IMPLANT
SUT MNCRL 4-0 (SUTURE) ×2
SUT MNCRL 4-0 27XMFL (SUTURE) ×1
SUT PDS AB 1 TP1 96 (SUTURE) ×3 IMPLANT
SUT PLAIN 2 0 XLH (SUTURE) ×3 IMPLANT
SUT PLAIN GUT 0 (SUTURE) IMPLANT
SUT VIC AB 0 CTX 36 (SUTURE) ×6
SUT VIC AB 0 CTX36XBRD ANBCTRL (SUTURE) ×3 IMPLANT
SUTURE MNCRL 4-0 27XMF (SUTURE) ×1 IMPLANT
SWABSTK COMLB BENZOIN TINCTURE (MISCELLANEOUS) ×3 IMPLANT
TRAXI PANNICULUS RETRACTOR (MISCELLANEOUS) ×2

## 2018-12-28 NOTE — Anesthesia Post-op Follow-up Note (Signed)
Anesthesia QCDR form completed.        

## 2018-12-28 NOTE — Transfer of Care (Signed)
Immediate Anesthesia Transfer of Care Note  Patient: Terry Zhang  Procedure(s) Performed: CESAREAN SECTION (N/A )  Patient Location: Mother/Baby  Anesthesia Type:Epidural  Level of Consciousness: awake, alert  and oriented  Airway & Oxygen Therapy: Patient Spontanous Breathing  Post-op Assessment: Report given to RN and Post -op Vital signs reviewed and stable  Post vital signs: Reviewed and stable  Last Vitals:  Vitals Value Taken Time  BP 144/93 12/28/2018 12:54 PM  Temp    Pulse 102 12/28/2018 12:54 PM  Resp 22 12/28/2018 12:54 PM  SpO2 95 % 12/28/2018 12:54 PM    Last Pain:  Vitals:   12/28/18 0952  TempSrc: Oral  PainSc:          Complications: No apparent anesthesia complications

## 2018-12-28 NOTE — Lactation Note (Signed)
This note was copied from a baby's chart. Lactation Consultation Note  Patient Name: Terry Zhang Today's Date: 12/28/2018     Maternal Data    Feeding Feeding Type: Breast Fed  LATCH Score Latch: Grasps breast easily, tongue down, lips flanged, rhythmical sucking.  Audible Swallowing: Spontaneous and intermittent  Type of Nipple: Everted at rest and after stimulation  Comfort (Breast/Nipple): Soft / non-tender  Hold (Positioning): Assistance needed to correctly position infant at breast and maintain latch.  LATCH Score: 9  Interventions    Lactation Tools Discussed/Used     Consult Status  Spoke with parents about breastfeeding and how to establish milk supply. Mom is anxious about breastfeeding so LC explained physiology of breastfeeding and how to increase supply. LC also reviewed clusterfeeding and stomach size as well as feeding cues and output. Parents state that they have no further question at this time.    Burnadette PeterJaniya M Keilyn Haggard 12/28/2018, 3:53 PM

## 2018-12-28 NOTE — Progress Notes (Signed)
L&D Progress Note    S: Having pressure/discomfort in back and hips. Has not pressed epidural button due to fear her BP may drop again. Has received ephedrine for hypotension after epidural. Worried about vaginal birth. Pushed for 5 hours with her first baby and then had a VAVD for a 6#5oz baby at [redacted] weeks gestation in 2010. EFW on 12/10 was 3708 gm, so this baby estimated to be over 9#  O: BP 130/76   Pulse (!) 122   Temp 98.5 F (36.9 C) (Oral)   LMP 03/14/2018 (Exact Date)   SpO2 95%    General: WF , alert, awake, oriented. Appears anxious  FHR: 120-125 baseline with accelerations to 130s to 150s, moderate variability, occasional subtle deceleration, not repetitive IUPC: Contractions every 2-3 minutes apart, mvuS 120  Cervix: 5/75-80%/ -2/ OT. Increased bloody show at this time.  A: Some progression even though mvus not adequate Hx of difficult vaginal delivery with 5 hours pushing with a baby that weighed 3# less.  P: DIscussed delivery options with patient: 1) Continue to monitor progress/ possibly augment with Pitocin, but with low threshold for doing Cesarean section if she does not progress. Would not be a candidate for forceps or vacuum if she does get completely dilated and pushing due to risk of shoulder dystocia. Needs to make steady progress in descent on her own.  2) Cesarean section-discussed risks of bleeding; infection; injury to other organs like bowel, bladder, or blood vessels; and risk of anesthesia. Also discussed risks of seroma/ wound dehiscence/ use of wound vacuum. She has previously consented to blood transfusion if needed.   Patient wishes to proceed with Cesarean section. Dr Jean RosenthalJackson consulted. Anesthesia/OR notified. Probably be in OR in one hour.  Farrel Connersolleen Juline Sanderford, CNM

## 2018-12-28 NOTE — Progress Notes (Signed)
Patient to undergo cesarean delivery due to suspected fetal macrosomia with previous difficulty delivering a 6 lb 14 oz infant with G1 that required vacuum assistance.  Patient counseled extensively regarding risks, benefits, alternatives to primary c-section versus attempted vaginal birth.  EFW based on growth u/s on 12/10 is nearly 4500 grams based on 225 gram weight gain per week and last growth at 3,708 grams 3 weeks ago.   Patient elects primary cesarean section due to potential risks of shoulder dystocia or even eventual need for cesarean anyway.  She understands that growth ultrasounds can under and over estimate growth by as much as 10-20% in the 3rd trimester.  All questions answered. Thomasene MohairStephen Phill Steck, MD, Merlinda FrederickFACOG [facility], [facility] 12/28/2018 11:09 AM

## 2018-12-28 NOTE — Anesthesia Preprocedure Evaluation (Addendum)
Anesthesia Evaluation  Patient identified by MRN, date of birth, ID band Patient awake    Reviewed: Allergy & Precautions, NPO status , Patient's Chart, lab work & pertinent test results, reviewed documented beta blocker date and time   Airway Mallampati: III  TM Distance: >3 FB     Dental  (+) Teeth Intact   Pulmonary former smoker,           Cardiovascular hypertension,      Neuro/Psych PSYCHIATRIC DISORDERS Anxiety Depression    GI/Hepatic   Endo/Other    Renal/GU      Musculoskeletal   Abdominal   Peds  Hematology   Anesthesia Other Findings Obese.  Reproductive/Obstetrics                            Anesthesia Physical Anesthesia Plan  ASA: III  Anesthesia Plan: Epidural   Post-op Pain Management:    Induction:   PONV Risk Score and Plan:   Airway Management Planned: Oral ETT  Additional Equipment:   Intra-op Plan:   Post-operative Plan:   Informed Consent: I have reviewed the patients History and Physical, chart, labs and discussed the procedure including the risks, benefits and alternatives for the proposed anesthesia with the patient or authorized representative who has indicated his/her understanding and acceptance.     Plan Discussed with: Anesthesiologist  Anesthesia Plan Comments:        Anesthesia Quick Evaluation

## 2018-12-28 NOTE — Op Note (Signed)
Cesarean Section Operative Note    Terry Zhang   12/28/2018   Pre-operative Diagnosis:  1) fetal macrosomia 2) intrauterine pregnancy at 1776w0d  Post-operative Diagnosis:  1) fetal macrosomia 2) intrauterine pregnancy at 4676w0d  Procedure: Primary Low Transverse Cesarean Section via Pfannenstiel incision with double layer uterine closure.    Surgeon: Moishe SpiceSurgeon(s) and Role:    * Conard NovakJackson, Stephen D, MD - Primary   Assistants: Farrel Connersolleen Gutierrez, CNM; No other capable assistant available, in surgery requiring high level assistant.  Anesthesia: epidural   Findings:  1) normal appearing gravid uterus, fallopian tubes, and ovaries 2) viable female infant Cicero Duck(Bayley Jo) with weight of 3,920 gram (8 lb 10 oz), APGARs 9 and 9   Estimated Blood Loss: 650 mL  Total IV Fluids: 1,000 ml   Urine Output: 50 mL clear urine at end of procedure  Specimens: none  Complications: no complications  Disposition: PACU - hemodynamically stable.   Maternal Condition: stable   Baby condition / location:  Couplet care / Skin to Skin  Procedure Details:  The patient was seen in the Holding Room. The risks, benefits, complications, treatment options, and expected outcomes were discussed with the patient. The patient concurred with the proposed plan, giving informed consent. identified as Terry Zhang and the procedure verified as C-Section Delivery. A Time Out was held and the above information confirmed.   After induction of anesthesia, the patient was draped and prepped in the usual sterile manner. A Pfannenstiel incision was made and carried down through the subcutaneous tissue to the fascia. Fascial incision was made and extended transversely. The fascia was separated from the underlying rectus tissue superiorly and inferiorly. The peritoneum was identified and entered. Peritoneal incision was extended longitudinally. The bladder flap was bluntly or sharply freed from the lower uterine  segment. A low transverse uterine incision was made and the hysterotomy was extended with cranial-caudal tension. Delivered from cephalic presentation was a 3,920 gram Living newborn infant(s) or Female with Apgar scores of 9 at one minute and 9 at five minutes. Cord ph was not sent the umbilical cord was clamped and cut cord blood was not obtained for evaluation. The placenta was removed Intact and appeared normal. The uterine outline, tubes and ovaries appeared normal. The uterine incision was closed with running locked sutures of 0 Vicryl.  A second layer of the same suture was thrown in an imbricating fashion.  Hemostasis was assured.  The uterus was returned to the abdomen and the paracolic gutters were cleared of all clots and debris.  The rectus muscles were inspected and found to be hemostatic. The peritoneum was closed with 0 vicryl in a running fashion due to high intra-abdominal pressure pushing the abdominal organs through the incisional opening of the peritoneum.   The fascia was then reapproximated with running sutures of 1-0 PDS, looped. The subcutaneous tissue was reapproximated using 2-0 plain gut such that no greater than 2cm of dead space remained. The subcuticular closure was performed using 4-0 monocryl. The skin closure was reinforced using surgical skin glue (Dermabond)  Instrument, sponge, and needle counts were correct prior the abdominal closure and were correct at the conclusion of the case.  The patient received Ancef 3 gram IV and Azithromycin 500 mg IV prior to skin incision (within 30 minutes). For VTE prophylaxis she was wearing SCDs throughout the case.  The assistant surgeon was a CNM due to lack of availability of another Sales promotion account executivequalified assistant.   Signed: Conard NovakStephen D. Jackson, MD  12/28/2018 12:45 PM

## 2018-12-28 NOTE — Progress Notes (Signed)
  Labor Progress Note   27 y.o. G2P0101 @ 745w0d , admitted for  Pregnancy, Labor Management, PROM  Subjective:  Comfortable with epidural after recovery from a hypotensive episode.  Objective:  BP (!) 124/53   Pulse 89   LMP 03/14/2018 (Exact Date)   SpO2 96%  Abd: mild Extr: trace to 1+ bilateral pedal edema SVE: 4/70/-2, IUPC placed  EFM: FHR: 130 bpm, variability: moderate,  accelerations:  Present,  decelerations:  Absent Toco: Irregular contractions Labs: I have reviewed the patient's lab results.   Assessment & Plan:  [id number] @ 8645w0d, admitted for  Pregnancy and Labor/Delivery Management  1. Pain management: epidural. 2. FWB: FHT category 1 now 3. ID: GBS negative 4. Labor management: Consider Pitocin augmentation if contractions do not return to previous pattern/ inadequate MVUs.  All discussed with patient, see orders.  Marcelyn BruinsJacelyn Yeng Perz, CNM 12/28/2018  2:04 AM

## 2018-12-28 NOTE — Discharge Summary (Signed)
OB Discharge Summary     Patient Name: Terry BaltimoreBrittany N Marker DOB: 1991/12/15 MRN: [id number]  Date of admission: 12/27/2018 Delivering MD: Thomasene MohairStephen Jackson, MD  Date of Delivery: 12/28/2018  Date of discharge: 12/30/2018  Admitting diagnosis: 39 weeks contractions Intrauterine pregnancy: 8428w0d     Secondary diagnosis: morbid obesity, suspected fetal macrosomia     Discharge diagnosis: Term Pregnancy Delivered                                                                                                Post partum procedures: None  Augmentation: n/a  Complications: None  Hospital course:  Onset of Labor With Unplanned C/S  27 y.o. yo [id number] at 3228w0d was admitted in Active Labor on 12/27/2018. Patient had a labor course significant for progression to 5 cm.  Then, after discussion about her prior delivery and the suspected large size of this infant, she elected to undergo a primary cesarean delivery. Membrane Rupture Time/Date: 8:30 PM ,12/27/2018   The patient went for cesarean section due to Macrosomia, and delivered a Viable infant,12/28/2018  Details of operation can be found in separate operative note. Patient had an uncomplicated postpartum course.  She is ambulating,tolerating a regular diet, passing flatus, and urinating well.  Patient is discharged home in stable condition 12/30/18.  Physical exam  Vitals:   12/29/18 2039 12/30/18 0046 12/30/18 0424 12/30/18 0740  BP: 131/74 117/71 116/62 114/69  Pulse: (!) 112 (!) 126 97 86  Resp: 20 20 20 20   Temp: 98.2 F (36.8 C) 98.5 F (36.9 C) 98.1 F (36.7 C) 97.8 F (36.6 C)  TempSrc: Oral Oral Oral Oral  SpO2: 94% 97% 96% 98%  Weight:      Height:       General: alert, cooperative and no distress Lochia: appropriate Uterine Fundus: firm Incision: Dressing is clean, dry, and intact DVT Evaluation: No evidence of DVT seen on physical exam.  Labs: Lab Results  Component Value Date   WBC 11.6 (H) 12/29/2018   HGB 10.4 (L)  12/29/2018   HCT 32.2 (L) 12/29/2018   MCV 87.7 12/29/2018   PLT 132 (L) 12/29/2018    Discharge instruction: per After Visit Summary.  Medications:  Allergies as of 12/30/2018   No Known Allergies     Medication List    TAKE these medications   multivitamin-prenatal 27-0.8 MG Tabs tablet Take 1 tablet by mouth daily at 12 noon.   oxyCODONE 5 MG immediate release tablet Commonly known as:  ROXICODONE Take 1 tablet (5 mg total) by mouth every 6 (six) hours as needed for up to 7 days.   sertraline 50 MG tablet Commonly known as:  ZOLOFT Take 1 tablet (50 mg total) by mouth daily.            Discharge Care Instructions  (From admission, onward)         Start     Ordered   12/30/18 0000  Discharge wound care:    Comments:  You may apply a light dressing for minor discharge from the incision or to keep waistbands of clothing from rubbing.  You may also have been discharge with a clear dressing in which case this will be removed at your postoperative clinic visit.  You may shower, use soap on your incision.  Avoid baths or soaking the incision in the first 6 weeks following your surgery.Marland Kitchen.   12/30/18 1011          Diet: routine diet  Activity: Advance as tolerated. Pelvic rest for 6 weeks.   Outpatient follow up: Follow-up Information    Conard NovakJackson, Stephen D, MD. Schedule an appointment as soon as possible for a visit in 1 week(s).   Specialty:  Obstetrics and Gynecology Why:  Post op incision check, postpartum depression check Contact information: [address] [place] [address] [phone]             Postpartum contraception: Progesterone only pills Rhogam Given postpartum: no Rubella vaccine given postpartum: no Varicella vaccine given postpartum: no TDaP given antepartum or postpartum: 10/27/2018 Influenza vaccine: 10/13/2018  Newborn Data: Live born female  Birth Weight: 8 lb 10.3 oz (3920 g) APGAR: 9, 9  Newborn Delivery   Birth  date/time:  12/28/2018 11:44:00 Delivery type:  C-Section, Low Transverse Trial of labor:  Yes C-section categorization:  Primary      Baby Feeding: Breast  Disposition: home with mother  SIGNED: Marcelyn BruinsJacelyn Schmid, CNM 12/30/2018

## 2018-12-29 ENCOUNTER — Encounter: Payer: Self-pay | Admitting: Obstetrics and Gynecology

## 2018-12-29 LAB — CBC
HEMATOCRIT: 32.2 % — AB (ref 36.0–46.0)
Hemoglobin: 10.4 g/dL — ABNORMAL LOW (ref 12.0–15.0)
MCH: 28.3 pg (ref 26.0–34.0)
MCHC: 32.3 g/dL (ref 30.0–36.0)
MCV: 87.7 fL (ref 80.0–100.0)
NRBC: 0 % (ref 0.0–0.2)
Platelets: 132 10*3/uL — ABNORMAL LOW (ref 150–400)
RBC: 3.67 MIL/uL — ABNORMAL LOW (ref 3.87–5.11)
RDW: 15.9 % — ABNORMAL HIGH (ref 11.5–15.5)
WBC: 11.6 10*3/uL — ABNORMAL HIGH (ref 4.0–10.5)

## 2018-12-29 LAB — RPR: RPR Ser Ql: NONREACTIVE

## 2018-12-29 NOTE — Anesthesia Postprocedure Evaluation (Cosign Needed)
Anesthesia Post Note  Patient: Herbie BaltimoreBrittany N Morden  Procedure(s) Performed: CESAREAN SECTION (N/A )  Patient location during evaluation: Mother Baby Anesthesia Type: Epidural Level of consciousness: awake, awake and alert, oriented and patient cooperative Pain management: pain level controlled Vital Signs Assessment: post-procedure vital signs reviewed and stable Respiratory status: spontaneous breathing Cardiovascular status: stable Postop Assessment: no headache, no backache, patient able to bend at knees, no apparent nausea or vomiting, adequate PO intake and able to ambulate Anesthetic complications: no     Last Vitals:  Vitals:   12/29/18 0414 12/29/18 0500  BP: (!) 116/59   Pulse: 96 90  Resp: 18   Temp: 36.7 C   SpO2: 98% 95%    Last Pain:  Vitals:   12/29/18 0600  TempSrc:   PainSc: 6                  Tierre Gerard,  Saamiya Jeppsen R

## 2018-12-29 NOTE — Plan of Care (Signed)
Vs stable; stood at bedside with RN and nurse tech; took iv toradol once (per order) and tolerated po percocet; diet advanced to regular; breastfeeding and needs some assistance; foley to be removed at 0600 (pt's choice)

## 2018-12-29 NOTE — Progress Notes (Signed)
  Subjective:   Post Op Day 1. Patient is doing well. She is tolerating PO intake and her pain is well controlled with PO medications. She is ambulating and voiding without difficulty.  Objective:  Blood pressure 108/68, pulse 99, temperature 98.3 F (36.8 C), temperature source Oral, resp. rate 20, height 5\' 8"  (1.727 m), weight (!) 141.1 kg, last menstrual period 03/14/2018, SpO2 94 %, currently breastfeeding  General: NAD Pulmonary: no increased work of breathing Abdomen: non-distended, non-tender, fundus firm at level of umbilicus Incision: honeycomb dressing is C/D/I Extremities: no edema, no erythema, no tenderness  Results for orders placed or performed during the hospital encounter of 12/27/18 (from the past 24 hour(s))  CBC     Status: Abnormal   Collection Time: 12/28/18  4:31 PM  Result Value Ref Range   WBC 14.3 (H) 4.0 - 10.5 K/uL   RBC 4.02 3.87 - 5.11 MIL/uL   Hemoglobin 11.3 (L) 12.0 - 15.0 g/dL   HCT 19.134.7 (L) 47.836.0 - 29.546.0 %   MCV 86.3 80.0 - 100.0 fL   MCH 28.1 26.0 - 34.0 pg   MCHC 32.6 30.0 - 36.0 g/dL   RDW 62.115.7 (H) 30.811.5 - 65.715.5 %   Platelets 134 (L) 150 - 400 K/uL   nRBC 0.0 0.0 - 0.2 %  Creatinine, serum     Status: Abnormal   Collection Time: 12/28/18  4:31 PM  Result Value Ref Range   Creatinine, Ser 0.43 (L) 0.44 - 1.00 mg/dL   GFR calc non Af Amer >60 >60 mL/min   GFR calc Af Amer >60 >60 mL/min  CBC     Status: Abnormal   Collection Time: 12/29/18  4:52 AM  Result Value Ref Range   WBC 11.6 (H) 4.0 - 10.5 K/uL   RBC 3.67 (L) 3.87 - 5.11 MIL/uL   Hemoglobin 10.4 (L) 12.0 - 15.0 g/dL   HCT 84.632.2 (L) 96.236.0 - 95.246.0 %   MCV 87.7 80.0 - 100.0 fL   MCH 28.3 26.0 - 34.0 pg   MCHC 32.3 30.0 - 36.0 g/dL   RDW 84.115.9 (H) 32.411.5 - 40.115.5 %   Platelets 132 (L) 150 - 400 K/uL   nRBC 0.0 0.0 - 0.2 %    Intake/Output Summary (Last 24 hours) at 12/29/2018 1035 Last data filed at 12/29/2018 0600 Gross per 24 hour  Intake 2096.25 ml  Output 2490 ml  Net -393.75 ml      Assessment:   27 y.o. [id number] postoperativeday # 1   Plan:  1) Acute blood loss anemia - hemodynamically stable and asymptomatic - po ferrous sulfate  2) A positive, Rubella Immune, Varicella Immune  3) TDAP status given antepartum  4) Breastfeeding  5) Contraception: plans minipills  6) Disposition: continue routine post c/section care   Tresea MallJane Brittiny Levitz, CNM

## 2018-12-29 NOTE — Lactation Note (Signed)
This note was copied from a baby's chart. Lactation Consultation Note  Patient Name: Girl Elease EtienneBrittany Fecteau ZOXWR'UToday's Date: 12/29/2018 Reason for consult: Follow-up assessment First child to breastfeed  Maternal Data Formula Feeding for Exclusion: No Has patient been taught Hand Expression?: Yes Does the patient have breastfeeding experience prior to this delivery?: No Difficult for mom to position baby at breast and keep baby awake Feeding Feeding Type: Breast Fed Baby still burping mucousy liquid, still gaggy at times [facility] Latch: Repeated attempts needed to sustain latch, nipple held in mouth throughout feeding, stimulation needed to elicit sucking reflex.  Audible Swallowing: A few with stimulation  Type of Nipple: Everted at rest and after stimulation  Comfort (Breast/Nipple): Soft / non-tender  Hold (Positioning): Assistance needed to correctly position infant at breast and maintain latch.  LATCH Score: 7  Interventions Interventions: Breast feeding basics reviewed;Assisted with latch;Hand express;Breast compression;Adjust position;Support pillows;Position options  Lactation Tools Discussed/Used     Consult Status Consult Status: Follow-up Date: 12/30/18 Follow-up type: In-patient    Dyann KiefMarsha D Shanti Agresti 12/29/2018, 7:23 PM

## 2018-12-30 MED ORDER — OXYCODONE HCL 5 MG PO TABS: 5.0000 mg | ORAL_TABLET | Freq: Four times a day (QID) | ORAL | 0 refills | Status: AC | PRN

## 2018-12-30 NOTE — Lactation Note (Signed)
This note was copied from a baby's chart. Lactation Consultation Note  Patient Name: Terry Zhang Today's Date: 12/30/2018 Reason for consult: Initial assessment   Maternal Data    Feeding Feeding Type: Breast Fed  LATCH Score Latch: Repeated attempts needed to sustain latch, nipple held in mouth throughout feeding, stimulation needed to elicit sucking reflex.  Audible Swallowing: Spontaneous and intermittent  Type of Nipple: Everted at rest and after stimulation  Comfort (Breast/Nipple): Soft / non-tender  Hold (Positioning): Assistance needed to correctly position infant at breast and maintain latch.  LATCH Score: 8  Interventions Interventions: Breast feeding basics reviewed;Assisted with latch;Adjust position;Support pillows;Position options  Lactation Tools Discussed/Used     Consult Status Consult Status: Complete Date: 12/30/18 Follow-up type: In-patient LC assisted mother with latch and positioning of infant in the football hold on the left side. Infant was able to latch with swallows heard and fed for about 5 minutes. Parents were educated on infant feeding cues, frequency of wet and dirty diapers and clusterfeeding. Parents denied additional concerns or questions at this time.   Arlyss Gandy 12/30/2018, 12:05 PM

## 2018-12-30 NOTE — Progress Notes (Signed)
DC to home to car via NT.  DC education complete

## 2018-12-31 ENCOUNTER — Telehealth: Payer: Self-pay

## 2018-12-31 NOTE — Telephone Encounter (Signed)
Asher Muir from [employer] to verify delivery information.  [phone]  Okay to leave vm.  Left detailed msg of information.

## 2019-01-04 ENCOUNTER — Encounter: Payer: Self-pay | Admitting: Obstetrics and Gynecology

## 2019-01-04 ENCOUNTER — Ambulatory Visit (INDEPENDENT_AMBULATORY_CARE_PROVIDER_SITE_OTHER): Payer: Managed Care, Other (non HMO) | Admitting: Obstetrics and Gynecology

## 2019-01-04 VITALS — BP 128/84 | Ht 68.0 in | Wt 290.0 lb

## 2019-01-04 DIAGNOSIS — Z09 Encounter for follow-up examination after completed treatment for conditions other than malignant neoplasm: Secondary | ICD-10-CM

## 2019-01-04 DIAGNOSIS — F329 Major depressive disorder, single episode, unspecified: Secondary | ICD-10-CM

## 2019-01-04 DIAGNOSIS — F419 Anxiety disorder, unspecified: Secondary | ICD-10-CM

## 2019-01-04 NOTE — Progress Notes (Signed)
   Postoperative Follow-up Patient presents post op from cesarean section  1 weeks ago.  Subjective: She denies fever, chills, nausea and vomiting. Eating a regular diet without difficulty. The patient is not having any pain.  Activity: appropriate activities given postop status. She denies issues with her incision.    History of depression/anxiety: denies symptoms today. EPDS today: 2  Objective: BP 128/84   Ht 5\' 8"  (1.727 m)   Wt 290 lb (131.5 kg)   LMP 03/14/2018 (Exact Date)   BMI 44.09 kg/m   Constitutional: Well nourished, well developed female in no acute distress.  HEENT: normal Skin: Warm and dry.  Abdomen: s, nt, nd, uterine fundus at U-2 and firm clean, dry, intact and without erythema, induration, warmth, and tenderness Extremity: no edema   Assessment: 28 y.o. s/p cesarean section progressing well  Plan: Patient has done well after surgery with no apparent complications.  I have discussed the post-operative course to date, and the expected progress moving forward.  The patient understands what complications to be concerned about.    Activity plan: increase slowly  Return in about 5 weeks (around 02/08/2019) for Six Week Postpartum.  Thomasene Mohair, MD 01/04/2019 12:16 PM

## 2019-02-11 ENCOUNTER — Encounter: Payer: Self-pay | Admitting: Obstetrics and Gynecology

## 2019-02-11 ENCOUNTER — Ambulatory Visit (INDEPENDENT_AMBULATORY_CARE_PROVIDER_SITE_OTHER): Payer: Managed Care, Other (non HMO) | Admitting: Obstetrics and Gynecology

## 2019-02-11 DIAGNOSIS — F419 Anxiety disorder, unspecified: Secondary | ICD-10-CM

## 2019-02-11 DIAGNOSIS — T8189XA Other complications of procedures, not elsewhere classified, initial encounter: Secondary | ICD-10-CM

## 2019-02-11 DIAGNOSIS — Z30011 Encounter for initial prescription of contraceptive pills: Secondary | ICD-10-CM

## 2019-02-11 MED ORDER — ALPRAZOLAM 0.5 MG PO TABS: 0.5000 mg | ORAL_TABLET | Freq: Two times a day (BID) | ORAL | 0 refills | Status: DC | PRN

## 2019-02-11 MED ORDER — NORGESTIMATE-ETH ESTRADIOL 0.25-35 MG-MCG PO TABS: 1.0000 | ORAL_TABLET | Freq: Every day | ORAL | 3 refills | Status: DC

## 2019-02-11 NOTE — Progress Notes (Signed)
Postpartum Visit   Chief Complaint  Patient presents with  . Postpartum Care   History of Present Illness: Patient is a 28 y.o. [id number] presents for postpartum visit.  Date of delivery: 12/28/2018 Type of delivery: C-Section Episiotomy No.  Laceration: no Pregnancy or labor problems:  Gestational hypertension  Any problems since the delivery:  She noted some slight bleeding from her incision site yesterday. No fevers, chills. No other issues from the incision.   Newborn Details:  SINGLETON :  1. Baby's name: Megan SalonBailee. Birth weight: 8.10lb Maternal Details:  Breast Feeding:  Bottle Post partum depression/anxiety noted:  Anxiety, taking Zoloft  Edinburgh Post-Partum Depression Score:  4  Date of last PAP: 05/06/2018/NORMAL  Past Medical History:  Diagnosis Date  . Anxiety   . Depression   . Hyperlipidemia 02/23/2016  . Obesity     Past Surgical History:  Procedure Laterality Date  . ANKLE SURGERY    . BREAST BIOPSY  2003  . CESAREAN SECTION N/A 12/28/2018   Procedure: CESAREAN SECTION;  Surgeon: Conard NovakJackson, Stephen D, MD;  Location: [facility] ORS;  Service: Obstetrics;  Laterality: N/A;  . TONSILLECTOMY      Prior to Admission medications   Medication Sig Start Date End Date Taking? Authorizing Provider  Prenatal Vit-Fe Fumarate-FA (MULTIVITAMIN-PRENATAL) 27-0.8 MG TABS tablet Take 1 tablet by mouth daily at 12 noon.   Yes [provider]  sertraline (ZOLOFT) 50 MG tablet Take 1 tablet (50 mg total) by mouth daily. 08/18/18  Yes Vena AustriaStaebler, Andreas, MD    No Known Allergies   Social History   Socioeconomic History  . Marital status: Married    Spouse name: Not on file  . Number of children: Not on file  . Years of education: Not on file  . Highest education level: Not on file  Occupational History  . Not on file  Social Needs  . Financial resource strain: Not hard at all  . Food insecurity:    Worry: Never true    Inability: Never true  . Transportation  needs:    Medical: No    Non-medical: No  Tobacco Use  . Smoking status: Former Smoker    Packs/day: 0.25    Types: Cigarettes    Last attempt to quit: 04/23/2018    Years since quitting: 0.8  . Smokeless tobacco: Never Used  Substance and Sexual Activity  . Alcohol use: No    Alcohol/week: 0.0 standard drinks  . Drug use: No  . Sexual activity: Yes    Birth control/protection: Pill  Lifestyle  . Physical activity:    Days per week: 0 days    Minutes per session: 0 min  . Stress: Only a little  Relationships  . Social connections:    Talks on phone: Three times a week    Gets together: Three times a week    Attends religious service: Never    Active member of club or organization: No    Attends meetings of clubs or organizations: Never    Relationship status: Married  . Intimate partner violence:    Fear of current or ex partner: Not on file    Emotionally abused: Not on file    Physically abused: Not on file    Forced sexual activity: Not on file  Other Topics Concern  . Not on file  Social History Narrative   Married.   1 daughter.   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   Once enjoyed playing sports, going to  the movies, swimming.    Family History  Problem Relation Age of Onset  . Arthritis Mother   . Cancer Mother [id number]       cervical  . Hyperlipidemia Mother   . Mental illness Mother   . Diabetes Mother   . Hyperlipidemia Father   . Hypertension Father   . Mental illness Father   . Mental illness Maternal Grandfather   . Diabetes Maternal Grandfather   . Pancreatic cancer Maternal Grandmother 73    Review of Systems  Constitutional: Negative.   HENT: Negative.   Eyes: Negative.   Respiratory: Negative.   Cardiovascular: Negative.   Gastrointestinal: Negative.   Genitourinary: Negative.   Musculoskeletal: Negative.   Skin: Negative.   Neurological: Negative.   Psychiatric/Behavioral: Negative.      Physical Exam BP 126/78   Ht 5\' 8"   (1.727 m)   Wt 286 lb (129.7 kg)   LMP 03/14/2018 (Exact Date)   BMI 43.49 kg/m   Physical Exam Constitutional:      General: She is not in acute distress.    Appearance: Normal appearance. She is well-developed.  Genitourinary:     Pelvic exam was performed with patient supine.     Vulva, inguinal canal, urethra, bladder, vagina, uterus, right adnexa and left adnexa normal.     No posterior fourchette tenderness, injury or lesion present.     No cervical friability, lesion, bleeding or polyp.  HENT:     Head: Normocephalic and atraumatic.  Eyes:     General: No scleral icterus.    Conjunctiva/sclera: Conjunctivae normal.  Neck:     Musculoskeletal: Normal range of motion and neck supple.  Cardiovascular:     Rate and Rhythm: Normal rate and regular rhythm.     Heart sounds: No murmur. No friction rub. No gallop.   Pulmonary:     Effort: Pulmonary effort is normal. No respiratory distress.     Breath sounds: Normal breath sounds. No wheezing or rales.  Abdominal:     General: Bowel sounds are normal. There is no distension.     Palpations: Abdomen is soft. There is no mass.     Tenderness: There is no abdominal tenderness. There is no guarding or rebound.       Comments: without erythema, induration, warmth, and tenderness.  There is an area that is approximately 7mm in diameter that appear to be just below the incision line. The defect is quite superficial.  There is mild bleeding. It is mildly tender. There is no surrounding erythema, induration, or warmth. The area is probed with a Q-tip and the defect is quite shallow with underlying granulation tissue. After manipulation of the area, there is an adjacent area about 3mm in size.   Musculoskeletal: Normal range of motion.  Neurological:     General: No focal deficit present.     Mental Status: She is alert and oriented to person, place, and time.     Cranial Nerves: No cranial nerve deficit.  Skin:    General: Skin is  warm and dry.     Findings: No erythema.  Psychiatric:        Mood and Affect: Mood normal.        Behavior: Behavior normal.        Judgment: Judgment normal.      Female Chaperone present during breast and/or pelvic exam.  Assessment: 28 y.o. [id number] presenting for 6 week postpartum visit  Plan: Problem List Items Addressed This Visit  None    Visit Diagnoses    Postpartum care and examination    -  Primary   Relevant Medications   norgestimate-ethinyl estradiol (SPRINTEC 28) 0.25-35 MG-MCG tablet   ALPRAZolam (XANAX) 0.5 MG tablet   Problem involving surgical incision       Encounter for initial prescription of contraceptive pills       Relevant Medications   norgestimate-ethinyl estradiol (SPRINTEC 28) 0.25-35 MG-MCG tablet   Anxiety       Relevant Medications   ALPRAZolam (XANAX) 0.5 MG tablet     1) Contraception Education given regarding options for contraception, including oral contraceptives.  2)  Pap - ASCCP guidelines and rational discussed.  Patient opts for routine screening interval  3) Patient underwent screening for postpartum depression with no concerns noted. She does have increased anxiety and asks for a short prescription until she can see her PCP. She requests Xanax and has received in the past.  So, new Rx given for 30 pills.   4) incision problem: etiology unclear.  Differential includes, infection (low likelihood), ulceration, issue with tissue re-approximation due to suture.  Will have her monitor of another week or so and see how it is progressing. If it becomes worse prior to that time, she is to return to clinic.   Return in about 10 days (around 02/21/2019) for f/u incision check.   Thomasene Mohair, MD 02/11/2019 1:25 PM

## 2019-02-19 ENCOUNTER — Telehealth: Payer: Self-pay

## 2019-02-19 NOTE — Telephone Encounter (Signed)
Pt calling for return to work note to return Mon., 02/22/19.  Would like to p.u tomorrow if possible.  [phone]  On 02/19/19 detailed msg left for pt that note is ready for p/u.

## 2019-02-23 ENCOUNTER — Ambulatory Visit: Payer: Managed Care, Other (non HMO) | Admitting: Obstetrics and Gynecology

## 2019-03-01 ENCOUNTER — Ambulatory Visit (INDEPENDENT_AMBULATORY_CARE_PROVIDER_SITE_OTHER): Payer: Managed Care, Other (non HMO) | Admitting: Obstetrics and Gynecology

## 2019-03-01 ENCOUNTER — Encounter: Payer: Self-pay | Admitting: Obstetrics and Gynecology

## 2019-03-01 VITALS — BP 126/88 | Ht 68.0 in | Wt 292.0 lb

## 2019-03-01 DIAGNOSIS — T8189XA Other complications of procedures, not elsewhere classified, initial encounter: Secondary | ICD-10-CM

## 2019-03-01 DIAGNOSIS — Z9889 Other specified postprocedural states: Secondary | ICD-10-CM

## 2019-03-01 NOTE — Progress Notes (Signed)
Postpartum Visit   Chief Complaint  Patient presents with  . Wound Check  . Postpartum Care   History of Present Illness: Patient is a 28 y.o. Terry Zhang presents for postpartum visit in follow up of her incision with two open areas from two weeks ago. She states the incisions are still present, but perhaps smaller. Denies fevers, chills, nausea, emesis.  Other issues with her incision. Denies pain at incision site.    Past Medical History:  Diagnosis Date  . Anxiety   . Depression   . Hyperlipidemia 02/23/2016  . Obesity     Past Surgical History:  Procedure Laterality Date  . ANKLE SURGERY    . BREAST BIOPSY  2003  . CESAREAN SECTION N/A 12/28/2018   Procedure: CESAREAN SECTION;  Surgeon: Conard Novak, MD;  Location: [facility] ORS;  Service: Obstetrics;  Laterality: N/A;  . TONSILLECTOMY      Prior to Admission medications   Medication Sig Start Date End Date Taking? Authorizing Provider  Prenatal Vit-Fe Fumarate-FA (MULTIVITAMIN-PRENATAL) 27-0.8 MG TABS tablet Take 1 tablet by mouth daily at 12 noon.   Yes [provider]  sertraline (ZOLOFT) 50 MG tablet Take 1 tablet (50 mg total) by mouth daily. 08/18/18  Yes Vena Austria, MD    No Known Allergies   Social History   Socioeconomic History  . Marital status: Married    Spouse name: Not on file  . Number of children: Not on file  . Years of education: Not on file  . Highest education level: Not on file  Occupational History  . Not on file  Social Needs  . Financial resource strain: Not hard at all  . Food insecurity:    Worry: Never true    Inability: Never true  . Transportation needs:    Medical: No    Non-medical: No  Tobacco Use  . Smoking status: Former Smoker    Packs/day: 0.25    Types: Cigarettes    Last attempt to quit: 04/23/2018    Years since quitting: 0.8  . Smokeless tobacco: Never Used  Substance and Sexual Activity  . Alcohol use: No    Alcohol/week: 0.0 standard drinks  .  Drug use: No  . Sexual activity: Yes    Birth control/protection: Pill  Lifestyle  . Physical activity:    Days per week: 0 days    Minutes per session: 0 min  . Stress: Only a little  Relationships  . Social connections:    Talks on phone: Three times a week    Gets together: Three times a week    Attends religious service: Never    Active member of club or organization: No    Attends meetings of clubs or organizations: Never    Relationship status: Married  . Intimate partner violence:    Fear of current or ex partner: Not on file    Emotionally abused: Not on file    Physically abused: Not on file    Forced sexual activity: Not on file  Other Topics Concern  . Not on file  Social History Narrative   Married.   1 daughter.   Works as a Writer at [employer].   Once enjoyed playing sports, going to [employer], swimming.    Family History  Problem Relation Age of Onset  . Arthritis Mother   . Cancer Mother 67       cervical  . Hyperlipidemia Mother   . Mental illness Mother   .  Diabetes Mother   . Hyperlipidemia Father   . Hypertension Father   . Mental illness Father   . Mental illness Maternal Grandfather   . Diabetes Maternal Grandfather   . Pancreatic cancer Maternal Grandmother 73    Review of Systems  Constitutional: Negative.   HENT: Negative.   Eyes: Negative.   Respiratory: Negative.   Cardiovascular: Negative.   Gastrointestinal: Negative.   Genitourinary: Negative.   Musculoskeletal: Negative.   Skin: Negative.   Neurological: Negative.   Psychiatric/Behavioral: Negative.      Physical Exam BP 126/88   Ht 5\' 8"  (1.727 m)   Wt 292 lb (132.5 kg)   LMP 03/14/2018 (Exact Date)   BMI 44.40 kg/m   Physical Exam Constitutional:      General: She is not in acute distress.    Appearance: Normal appearance. She is well-developed.  HENT:     Head: Normocephalic and atraumatic.  Eyes:     General: No scleral icterus.     Conjunctiva/sclera: Conjunctivae normal.  Neck:     Musculoskeletal: Normal range of motion and neck supple.  Cardiovascular:     Rate and Rhythm: Normal rate and regular rhythm.     Heart sounds: No murmur. No friction rub. No gallop.   Pulmonary:     Effort: Pulmonary effort is normal. No respiratory distress.     Breath sounds: Normal breath sounds. No wheezing or rales.  Abdominal:     General: Bowel sounds are normal. There is no distension.     Palpations: Abdomen is soft. There is no mass.     Tenderness: There is no abdominal tenderness. There is no guarding or rebound.       Comments: without erythema, induration, warmth, and tenderness.  There is an area that is approximately 1mm in diameter that appear to be just below the incision line. The defect is quite superficial.  There is mild bleeding. It is mildly tender. There is no surrounding erythema, induration, or warmth. The area is probed with a Q-tip and the defect is quite shallow with underlying granulation tissue. The area is now only about 22mm in diameter.  The adjacent area about 5 mm in size, is quite shallow without erythema, induration, warmth, and tendernss.    Musculoskeletal: Normal range of motion.  Neurological:     General: No focal deficit present.     Mental Status: She is alert and oriented to person, place, and time.     Cranial Nerves: No cranial nerve deficit.  Skin:    General: Skin is warm and dry.     Findings: No erythema.  Psychiatric:        Mood and Affect: Mood normal.        Behavior: Behavior normal.        Judgment: Judgment normal.      Assessment: Terry Zhang who presents with incisional issue after surgery, stable to improving.  Plan:  Continue to monitor. No evidence of infection or worsening of incisional opening. It actually appears to be healing. The larger (left) defect appears shallower than prior.  If not improved in 3-4 weeks or becomes worse in the mean time, she was  instructed to return to clinic.  Return in about 3 weeks (around 03/22/2019) for or sooner if problem persists or worsens.   Thomasene Mohair, MD 03/01/2019 1:13 PM

## 2019-05-19 ENCOUNTER — Other Ambulatory Visit: Payer: Self-pay

## 2019-05-19 DIAGNOSIS — F419 Anxiety disorder, unspecified: Secondary | ICD-10-CM

## 2019-05-19 MED ORDER — ALPRAZOLAM 0.5 MG PO TABS: 0.5000 mg | ORAL_TABLET | Freq: Two times a day (BID) | ORAL | 0 refills | Status: DC | PRN

## 2019-05-19 NOTE — Telephone Encounter (Signed)
Please advise 

## 2019-07-12 ENCOUNTER — Other Ambulatory Visit: Payer: Self-pay | Admitting: Obstetrics and Gynecology

## 2019-07-12 NOTE — Telephone Encounter (Signed)
Terry Zhang has seen patient for her last appointments (ie: pp visit) AMS has not seen since December.

## 2019-07-29 ENCOUNTER — Other Ambulatory Visit: Payer: Self-pay | Admitting: Obstetrics and Gynecology

## 2019-07-29 NOTE — Telephone Encounter (Signed)
Pt needs appt. And SDJ out of office this week. Please schedule

## 2019-07-29 NOTE — Telephone Encounter (Signed)
Patient is schedule 08/12/19 with SDJ

## 2019-07-29 NOTE — Telephone Encounter (Signed)
Pt's last few appointment has been with SDJ

## 2019-07-30 NOTE — Telephone Encounter (Signed)
Terry Sidles, do you feel comfortable refilling this since pt has appt ? Since SDJ out of office

## 2019-08-03 IMAGING — CR DG CHEST 2V
1 series · 2 of 2 positions shown · non-contrast
Comparison: 01/23/2012

CLINICAL DATA: Palpitations and shortness of breath for 5 days, 13
weeks pregnant

EXAM:
CHEST - 2 VIEW

[Series 1: dg chest 2 view · 0.14mm/px · 2 of 2 slices shown]
[im 1/2]
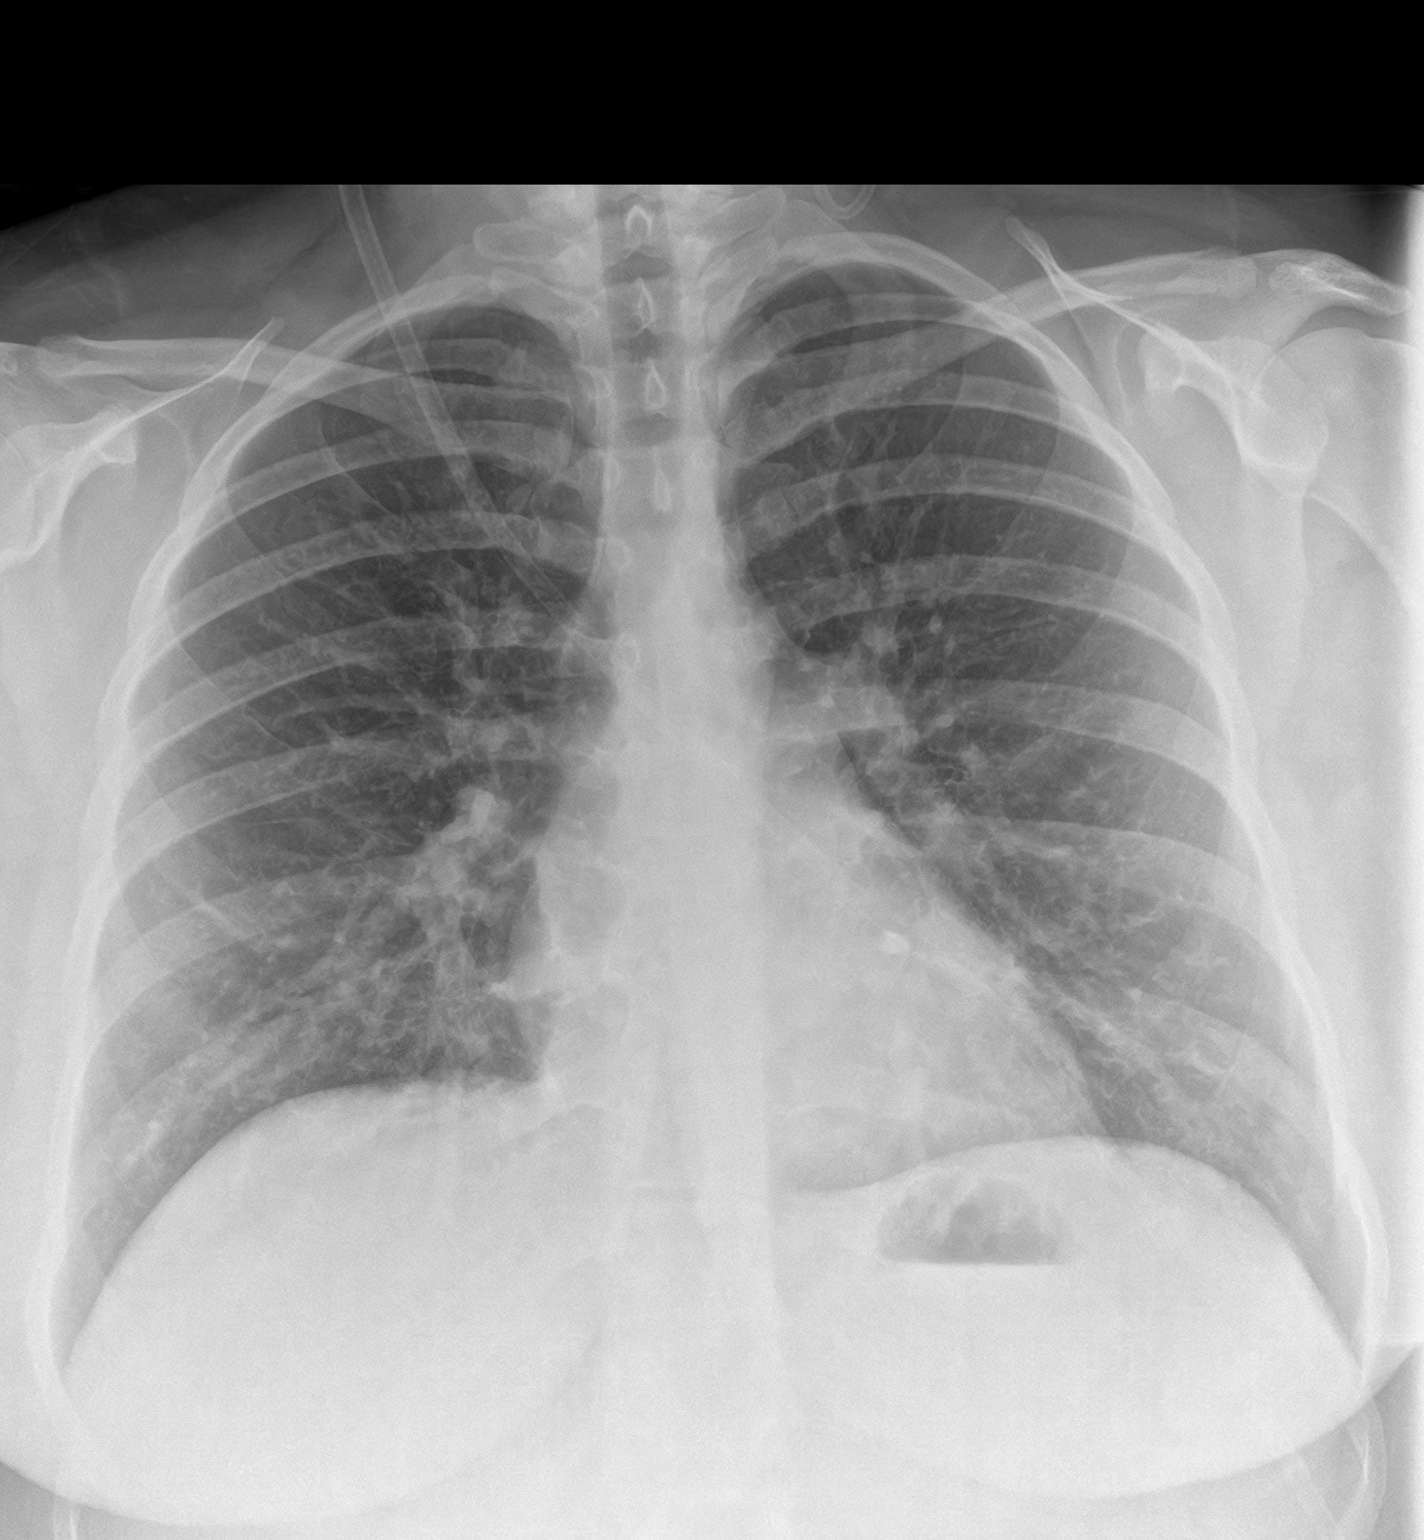
[im 2/2]
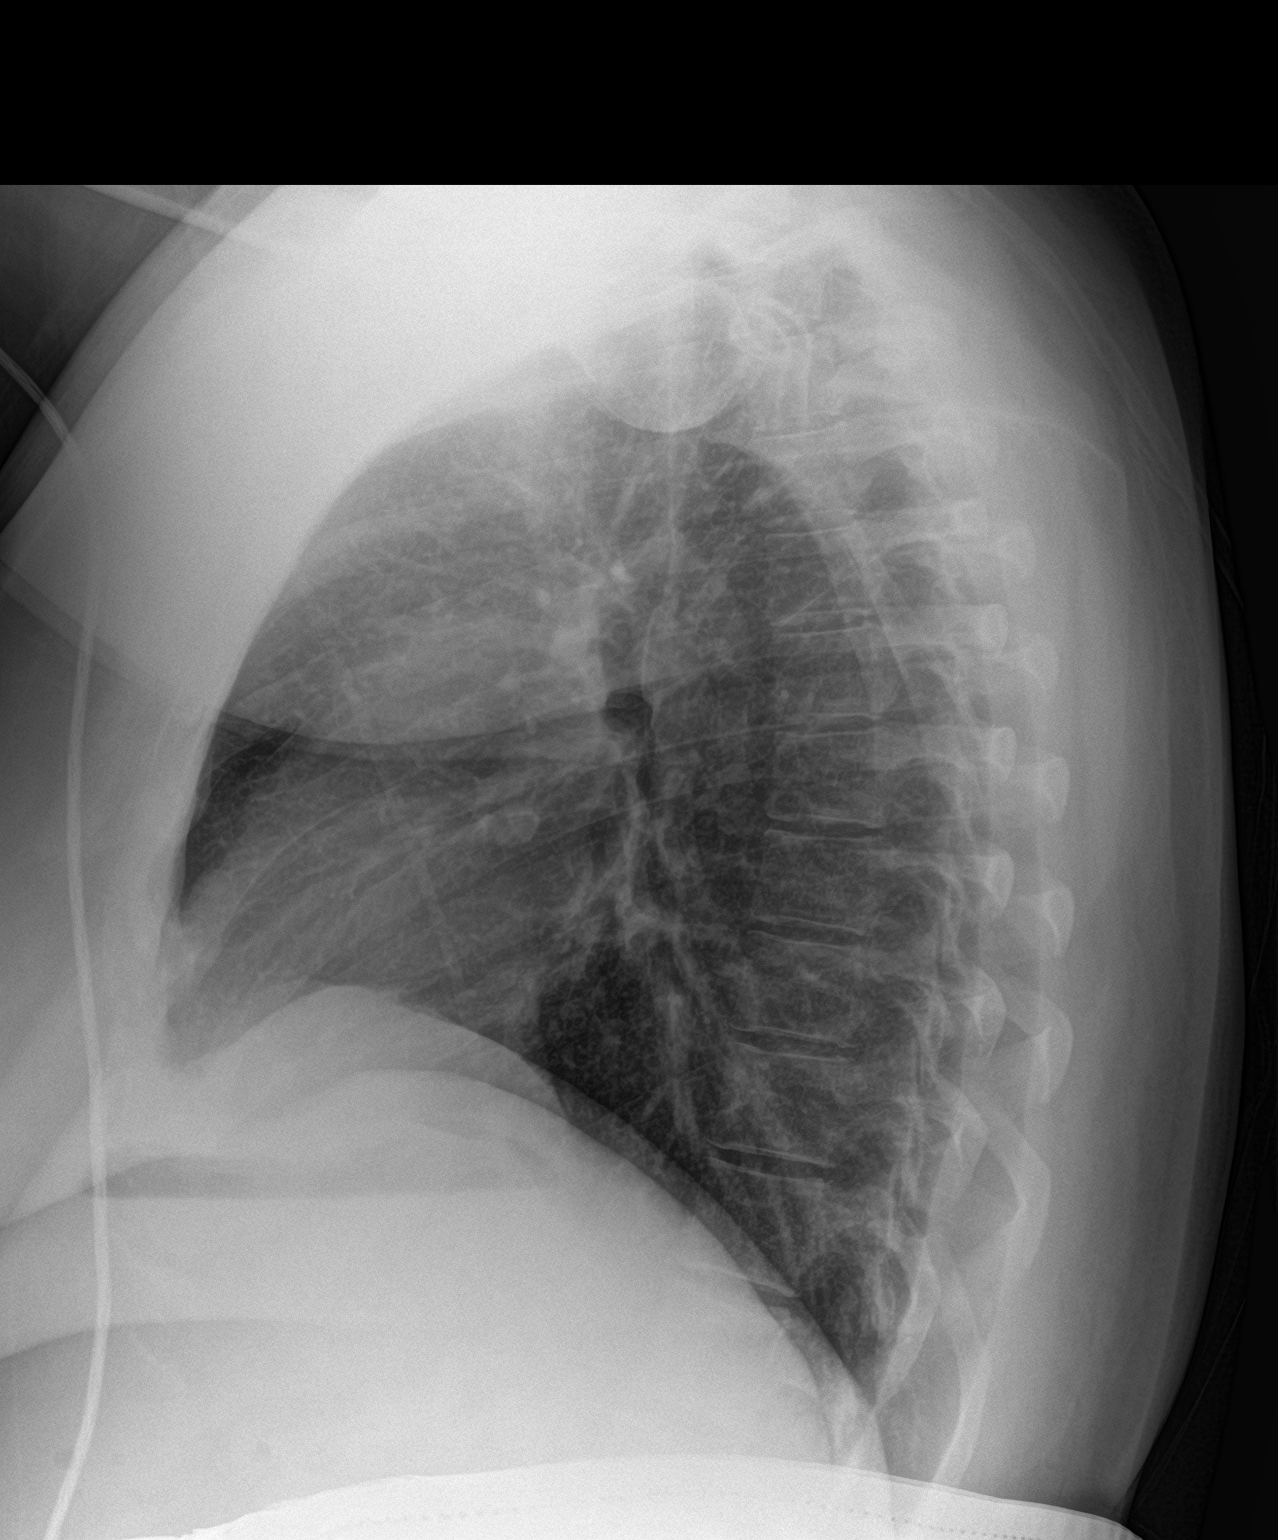

[2 of 2 positions shown; findings below may reference images not displayed]

FINDINGS: Normal heart size, mediastinal contours, and pulmonary vascularity.

Minimal chronic peribronchial thickening.

Lungs clear.

No infiltrate, pleural effusion or pneumothorax.

Bones unremarkable.
IMPRESSION: Minimal chronic bronchitic changes without infiltrate.

## 2019-08-12 ENCOUNTER — Encounter
Admission: RE | Admit: 2019-08-12 | Discharge: 2019-08-12 | Disposition: A | Discharge status: Home / Self Care | Payer: Managed Care, Other (non HMO) | Source: Ambulatory Visit | Attending: Otolaryngology | Admitting: Otolaryngology

## 2019-08-12 ENCOUNTER — Other Ambulatory Visit: Payer: Self-pay

## 2019-08-12 ENCOUNTER — Encounter: Payer: Self-pay | Admitting: Obstetrics and Gynecology

## 2019-08-12 ENCOUNTER — Ambulatory Visit (INDEPENDENT_AMBULATORY_CARE_PROVIDER_SITE_OTHER): Payer: Managed Care, Other (non HMO) | Admitting: Obstetrics and Gynecology

## 2019-08-12 VITALS — BP 128/84 | Wt 302.0 lb

## 2019-08-12 DIAGNOSIS — F419 Anxiety disorder, unspecified: Secondary | ICD-10-CM | POA: Diagnosis not present

## 2019-08-12 DIAGNOSIS — F329 Major depressive disorder, single episode, unspecified: Secondary | ICD-10-CM | POA: Diagnosis not present

## 2019-08-12 HISTORY — DX: Other complications of anesthesia, initial encounter: T88.59XA

## 2019-08-12 MED ORDER — ALPRAZOLAM 0.5 MG PO TABS: 0.5000 mg | ORAL_TABLET | Freq: Two times a day (BID) | ORAL | 0 refills | Status: DC | PRN

## 2019-08-12 NOTE — Patient Instructions (Signed)
Your procedure is scheduled on: 08/17/19 Report to Day Surgery.[facility]OND FLOOR To find out your arrival time please call [phone] between 1PM - 3PM on 08/16/19.  Remember: Instructions that are not followed completely may result in serious medical risk,  up to and including death, or upon the discretion of your surgeon and anesthesiologist your  surgery may need to be rescheduled.     _X__ 1. Do not eat food after midnight the night before your procedure.                 No gum chewing or hard candies. You may drink clear liquids up to 2 hours                 before you are scheduled to arrive for your surgery- DO not drink clear                 liquids within 2 hours of the start of your surgery.                 Clear Liquids include:  water, apple juice without pulp, clear carbohydrate                 drink such as Clearfast of Gatorade, Black Coffee or Tea (Do not add                 anything to coffee or tea).  __X__2.  On the morning of surgery brush your teeth with toothpaste and water, you                may rinse your mouth with mouthwash if you wish.  Do not swallow any toothpaste of mouthwash.     _X__ 3.  No Alcohol for 24 hours before or after surgery.   _X__ 4.  Do Not Smoke or use e-cigarettes For 24 Hours Prior to Your Surgery.                 Do not use any chewable tobacco products for at least 6 hours prior to                 surgery.  ____  5.  Bring all medications with you on the day of surgery if instructed.   _X___  6.  Notify your doctor if there is any change in your medical condition      (cold, fever, infections).     Do not wear jewelry, make-up, hairpins, clips or nail polish. Do not wear lotions, powders, or perfumes. You may wear deodorant. Do not shave 48 hours prior to surgery. Men may shave face and neck. Do not bring valuables to the hospital.    [facility] is not responsible for any belongings or  valuables.  Contacts, dentures or bridgework may not be worn into surgery. Leave your suitcase in the car. After surgery it may be brought to your room. For patients admitted to the hospital, discharge time is determined by your treatment team.   Patients discharged the day of surgery will not be allowed to drive home.             ____ Take these medicines the morning of surgery with A SIP OF WATER:    1. NONE  2.   3.   4.  5.  6.  ____ Fleet Enema (as directed)   ____ Use CHG Soap as directed  ____ Use inhalers on the day of surgery  ____  Stop metformin 2 days prior to surgery    ____ Take 1/2 of usual insulin dose the night before surgery. No insulin the morning          of surgery.   ____ Stop Coumadin/Plavix/aspirin on   ____ Stop Anti-inflammatories on    ____ Stop supplements until after surgery.    ____ Bring C-Pap to the hospital.

## 2019-08-12 NOTE — Progress Notes (Addendum)
Obstetrics & Gynecology Office Visit   Chief Complaint  Patient presents with  . Follow-up    History of Present Illness: 28 y.o. 92P1102 female who presents in follow-up for anxiety and depression.  She states that she is doing very well overall.  She has a surgery to reduce her turbinate volume as well as a septoplasty and she is concerned because when she has to breathe through her mouth exclusively this makes her more anxious.  Her last refill of Xanax was in May and she has used that sparingly, but needs a refill at this point.  She is currently taking Zoloft 50 mg and is doing well on that.  She denies suicidal and homicidal ideation and any other issues with the medication.  Past Medical History:  Diagnosis Date  . Anxiety   . Depression   . Hyperlipidemia 02/23/2016  . Obesity     Past Surgical History:  Procedure Laterality Date  . ANKLE SURGERY    . BREAST BIOPSY  2003  . CESAREAN SECTION N/A 12/28/2018   Procedure: CESAREAN SECTION;  Surgeon: Conard NovakJackson, Colten Desroches D, MD;  Location: [facility] ORS;  Service: Obstetrics;  Laterality: N/A;  . TONSILLECTOMY      Gynecologic History: Patient's last menstrual period was 07/29/2019.  Obstetric History: [id number]  Family History  Problem Relation Age of Onset  . Arthritis Mother   . Cancer Mother 854       cervical  . Hyperlipidemia Mother   . Mental illness Mother   . Diabetes Mother   . Hyperlipidemia Father   . Hypertension Father   . Mental illness Father   . Mental illness Maternal Grandfather   . Diabetes Maternal Grandfather   . Pancreatic cancer Maternal Grandmother [id number]    Social History   Socioeconomic History  . Marital status: Married    Spouse name: Not on file  . Number of children: Not on file  . Years of education: Not on file  . Highest education level: Not on file  Occupational History  . Not on file  Social Needs  . Financial resource strain: Not hard at all  . Food insecurity    Worry: Never true    Inability: Never true  . Transportation needs    Medical: No    Non-medical: No  Tobacco Use  . Smoking status: Former Smoker    Packs/day: 0.25    Types: Cigarettes    Quit date: 04/23/2018    Years since quitting: 1.3  . Smokeless tobacco: Never Used  Substance and Sexual Activity  . Alcohol use: No    Alcohol/week: 0.0 standard drinks  . Drug use: No  . Sexual activity: Yes    Birth control/protection: Pill  Lifestyle  . Physical activity    Days per week: 0 days    Minutes per session: 0 min  . Stress: Only a little  Relationships  . Social Musicianconnections    Talks on phone: Three times a week    Gets together: Three times a week    Attends religious service: Never    Active member of club or organization: No    Attends meetings of clubs or organizations: Never    Relationship status: Married  . Intimate partner violence    Fear of current or ex partner: Not on file    Emotionally abused: Not on file    Physically abused: Not on file    Forced sexual activity: Not on file  Other Topics Concern  .  Not on file  Social History Narrative   Married.   1 daughter.   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   Once enjoyed playing sports, going to [employer], swimming.    Allergies  Allergen Reactions  . Montelukast Palpitations    anxiety    Prior to Admission medications   Medication Sig Start Date End Date Taking? Authorizing Provider  ALPRAZolam Duanne Moron) 0.5 MG tablet Take 1 tablet (0.5 mg total) by mouth 2 (two) times daily as needed for anxiety. 05/19/19  Yes Will Bonnet, MD  levocetirizine (XYZAL) 5 MG tablet Take 5 mg by mouth at bedtime.   Yes [provider]  oxymetazoline (AFRIN) 0.05 % nasal spray Place 1-2 sprays into both nostrils 2 (two) times daily as needed for congestion.   Yes [provider]  sertraline (ZOLOFT) 50 MG tablet TAKE 1 TABLET BY MOUTH  DAILY Patient taking differently: Take 50 mg by mouth at bedtime.  07/30/19   Yes Rod Can, CNM  diphenhydrAMINE (BENADRYL) 25 MG tablet Take 25 mg by mouth daily as needed for allergies.    [provider]  norgestimate-ethinyl estradiol (Winona 28) 0.25-35 MG-MCG tablet Take 1 tablet by mouth daily. Patient taking differently: Take 1 tablet by mouth at bedtime.  02/11/19 08/09/19  Will Bonnet, MD    Review of Systems  Constitutional: Negative.   HENT: Negative.   Eyes: Negative.   Respiratory: Negative.   Cardiovascular: Negative.   Gastrointestinal: Negative.   Genitourinary: Negative.   Musculoskeletal: Negative.   Skin: Negative.   Neurological: Negative.   Psychiatric/Behavioral: Positive for depression (mild). Negative for hallucinations, memory loss, substance abuse and suicidal ideas. The patient is nervous/anxious (mild). The patient does not have insomnia.      Physical Exam BP 128/84   Wt (!) 302 lb (137 kg)   LMP 07/29/2019   BMI 45.92 kg/m  Patient's last menstrual period was 07/29/2019. Physical Exam Constitutional:      General: She is not in acute distress.    Appearance: Normal appearance.  HENT:     Head: Normocephalic and atraumatic.  Eyes:     General: No scleral icterus.    Conjunctiva/sclera: Conjunctivae normal.  Neurological:     General: No focal deficit present.     Mental Status: She is alert and oriented to person, place, and time.     Cranial Nerves: No cranial nerve deficit.  Psychiatric:        Mood and Affect: Mood normal.        Behavior: Behavior normal.        Judgment: Judgment normal.    GAD7: 9 EPDS: 31  Female chaperone present for pelvic and breast  portions of the physical exam  Assessment: 28 y.o. [id number] female here for  1. Anxiety and depression   2. Postpartum care and examination   3. Anxiety      Plan: Problem List Items Addressed This Visit      Other   Anxiety and depression - Primary   Relevant Medications   ALPRAZolam (XANAX) 0.5 MG tablet    Other Visit  Diagnoses    Postpartum care and examination       Anxiety       Relevant Medications   ALPRAZolam (XANAX) 0.5 MG tablet     Will refill her Xanax, as she has used it sparingly over the past 3 months.  Continue Zoloft as prescribed.  She needs no refills at this time.  She will establish with her PCP for ongoing care of her anxiety and depression when she is able.  Follow-up for routine gynecologic care in 6 to 8 months.  Precautions reiterated for suicidal and/or homicidal ideation with instructions to go to the ER should any of these symptoms occur.  15 minutes spent in face to face discussion with > 50% spent in counseling,management, and coordination of care of her anxiety and depression.   Thomasene MohairStephen [name], MD 08/12/2019 8:30 AM

## 2019-08-13 ENCOUNTER — Other Ambulatory Visit
Admission: RE | Admit: 2019-08-13 | Discharge: 2019-08-13 | Disposition: A | Discharge status: Home / Self Care | Payer: Managed Care, Other (non HMO) | Source: Ambulatory Visit | Attending: Otolaryngology | Admitting: Otolaryngology

## 2019-08-13 ENCOUNTER — Other Ambulatory Visit: Payer: Self-pay | Admitting: Otolaryngology

## 2019-08-13 DIAGNOSIS — Z01812 Encounter for preprocedural laboratory examination: Secondary | ICD-10-CM | POA: Insufficient documentation

## 2019-08-13 DIAGNOSIS — Z20828 Contact with and (suspected) exposure to other viral communicable diseases: Secondary | ICD-10-CM | POA: Diagnosis not present

## 2019-08-13 DIAGNOSIS — J3489 Other specified disorders of nose and nasal sinuses: Secondary | ICD-10-CM

## 2019-08-13 LAB — SARS CORONAVIRUS 2 (TAT 6-24 HRS): SARS Coronavirus 2: NEGATIVE

## 2019-08-17 ENCOUNTER — Other Ambulatory Visit: Payer: Self-pay

## 2019-08-17 ENCOUNTER — Encounter: Payer: Self-pay | Admitting: *Deleted

## 2019-08-17 ENCOUNTER — Ambulatory Visit: Payer: Managed Care, Other (non HMO) | Admitting: Anesthesiology

## 2019-08-17 ENCOUNTER — Ambulatory Visit
Admission: RE | Admit: 2019-08-17 | Discharge: 2019-08-17 | Disposition: A | Discharge status: Home / Self Care | Payer: Managed Care, Other (non HMO) | Attending: Otolaryngology | Admitting: Otolaryngology

## 2019-08-17 ENCOUNTER — Encounter
Admission: RE | Disposition: A | Discharge status: Home / Self Care | Payer: Self-pay | Source: Home / Self Care | Attending: Otolaryngology

## 2019-08-17 DIAGNOSIS — F17210 Nicotine dependence, cigarettes, uncomplicated: Secondary | ICD-10-CM | POA: Insufficient documentation

## 2019-08-17 DIAGNOSIS — J342 Deviated nasal septum: Secondary | ICD-10-CM | POA: Insufficient documentation

## 2019-08-17 DIAGNOSIS — J343 Hypertrophy of nasal turbinates: Secondary | ICD-10-CM | POA: Diagnosis not present

## 2019-08-17 DIAGNOSIS — Z793 Long term (current) use of hormonal contraceptives: Secondary | ICD-10-CM | POA: Diagnosis not present

## 2019-08-17 DIAGNOSIS — F329 Major depressive disorder, single episode, unspecified: Secondary | ICD-10-CM | POA: Insufficient documentation

## 2019-08-17 DIAGNOSIS — F419 Anxiety disorder, unspecified: Secondary | ICD-10-CM | POA: Diagnosis not present

## 2019-08-17 DIAGNOSIS — Z79899 Other long term (current) drug therapy: Secondary | ICD-10-CM | POA: Insufficient documentation

## 2019-08-17 HISTORY — PX: SEPTOPLASTY: SHX2393

## 2019-08-17 HISTORY — PX: TURBINATE REDUCTION: SHX6157

## 2019-08-17 LAB — POCT PREGNANCY, URINE: Preg Test, Ur: NEGATIVE

## 2019-08-17 SURGERY — SEPTOPLASTY, NOSE
Anesthesia: General

## 2019-08-17 MED ORDER — OXYCODONE-ACETAMINOPHEN 5-325 MG PO TABS: 1.0000 | ORAL_TABLET | Freq: Four times a day (QID) | ORAL | 0 refills | Status: AC | PRN

## 2019-08-17 MED ORDER — ONDANSETRON HCL 4 MG/2ML IJ SOLN
INTRAMUSCULAR | Status: DC | PRN
  Administered 2019-08-17: 4 mg via INTRAVENOUS

## 2019-08-17 MED ORDER — FAMOTIDINE 20 MG PO TABS
ORAL_TABLET | ORAL | Status: AC
  Administered 2019-08-17: 07:00:00 20 mg via ORAL
  Filled 2019-08-17: qty 1

## 2019-08-17 MED ORDER — FENTANYL CITRATE (PF) 100 MCG/2ML IJ SOLN
INTRAMUSCULAR | Status: AC
  Filled 2019-08-17: qty 2

## 2019-08-17 MED ORDER — LACTATED RINGERS IV SOLN
INTRAVENOUS | Status: DC
  Administered 2019-08-17: 07:00:00 via INTRAVENOUS

## 2019-08-17 MED ORDER — OXYCODONE-ACETAMINOPHEN 5-325 MG PO TABS
ORAL_TABLET | ORAL | Status: AC
  Filled 2019-08-17: qty 1

## 2019-08-17 MED ORDER — DEXMEDETOMIDINE HCL 200 MCG/2ML IV SOLN
INTRAVENOUS | Status: DC | PRN
  Administered 2019-08-17 (×2): 16 ug via INTRAVENOUS

## 2019-08-17 MED ORDER — AMOXICILLIN-POT CLAVULANATE 875-125 MG PO TABS: 1.0000 | ORAL_TABLET | Freq: Two times a day (BID) | ORAL | 0 refills | Status: DC

## 2019-08-17 MED ORDER — LIDOCAINE-EPINEPHRINE 1 %-1:100000 IJ SOLN
INTRAMUSCULAR | Status: DC | PRN
  Administered 2019-08-17: 3 mL

## 2019-08-17 MED ORDER — MIDAZOLAM HCL 2 MG/2ML IJ SOLN
INTRAMUSCULAR | Status: DC | PRN
  Administered 2019-08-17: 2 mg via INTRAVENOUS

## 2019-08-17 MED ORDER — ONDANSETRON HCL 4 MG PO TABS: 4.0000 mg | ORAL_TABLET | Freq: Three times a day (TID) | ORAL | 0 refills | Status: DC | PRN

## 2019-08-17 MED ORDER — OXYMETAZOLINE HCL 0.05 % NA SOLN
NASAL | Status: AC
  Filled 2019-08-17: qty 30

## 2019-08-17 MED ORDER — ACETAMINOPHEN 10 MG/ML IV SOLN
INTRAVENOUS | Status: AC
  Filled 2019-08-17: qty 100

## 2019-08-17 MED ORDER — FAMOTIDINE 20 MG PO TABS
20.0000 mg | ORAL_TABLET | Freq: Once | ORAL | Status: AC
  Administered 2019-08-17: 07:00:00 20 mg via ORAL

## 2019-08-17 MED ORDER — ACETAMINOPHEN 10 MG/ML IV SOLN
INTRAVENOUS | Status: DC | PRN
  Administered 2019-08-17: 1000 mg via INTRAVENOUS

## 2019-08-17 MED ORDER — MIDAZOLAM HCL 2 MG/2ML IJ SOLN
INTRAMUSCULAR | Status: AC
  Filled 2019-08-17: qty 2

## 2019-08-17 MED ORDER — PROMETHAZINE HCL 25 MG/ML IJ SOLN: 6.2500 mg | INTRAMUSCULAR | Status: DC | PRN

## 2019-08-17 MED ORDER — DEXAMETHASONE SODIUM PHOSPHATE 10 MG/ML IJ SOLN
INTRAMUSCULAR | Status: DC | PRN
  Administered 2019-08-17: 10 mg via INTRAVENOUS

## 2019-08-17 MED ORDER — LACTATED RINGERS IV SOLN
INTRAVENOUS | Status: DC | PRN
  Administered 2019-08-17: 09:00:00 via INTRAVENOUS

## 2019-08-17 MED ORDER — FENTANYL CITRATE (PF) 100 MCG/2ML IJ SOLN
25.0000 ug | INTRAMUSCULAR | Status: DC | PRN
  Administered 2019-08-17: 11:00:00 25 ug via INTRAVENOUS

## 2019-08-17 MED ORDER — SUCCINYLCHOLINE CHLORIDE 20 MG/ML IJ SOLN
INTRAMUSCULAR | Status: DC | PRN
  Administered 2019-08-17: 140 mg via INTRAVENOUS

## 2019-08-17 MED ORDER — FENTANYL CITRATE (PF) 100 MCG/2ML IJ SOLN
INTRAMUSCULAR | Status: DC | PRN
  Administered 2019-08-17: 100 ug via INTRAVENOUS

## 2019-08-17 MED ORDER — LIDOCAINE-EPINEPHRINE 1 %-1:100000 IJ SOLN
INTRAMUSCULAR | Status: AC
  Filled 2019-08-17: qty 1

## 2019-08-17 MED ORDER — OXYCODONE-ACETAMINOPHEN 5-325 MG PO TABS
1.0000 | ORAL_TABLET | Freq: Once | ORAL | Status: AC
  Administered 2019-08-17: 11:00:00 1 via ORAL

## 2019-08-17 MED ORDER — ROCURONIUM BROMIDE 100 MG/10ML IV SOLN
INTRAVENOUS | Status: DC | PRN
  Administered 2019-08-17: 20 mg via INTRAVENOUS
  Administered 2019-08-17: 50 mg via INTRAVENOUS

## 2019-08-17 MED ORDER — PROPOFOL 10 MG/ML IV BOLUS
INTRAVENOUS | Status: DC | PRN
  Administered 2019-08-17: 100 mg via INTRAVENOUS
  Administered 2019-08-17: 200 mg via INTRAVENOUS
  Administered 2019-08-17: 30 mg via INTRAVENOUS

## 2019-08-17 MED ORDER — BACITRACIN ZINC 500 UNIT/GM EX OINT
TOPICAL_OINTMENT | CUTANEOUS | Status: AC
  Filled 2019-08-17: qty 28.35

## 2019-08-17 MED ORDER — SUGAMMADEX SODIUM 200 MG/2ML IV SOLN
INTRAVENOUS | Status: DC | PRN
  Administered 2019-08-17: 200 mg via INTRAVENOUS

## 2019-08-17 MED ORDER — LIDOCAINE HCL (CARDIAC) PF 100 MG/5ML IV SOSY
PREFILLED_SYRINGE | INTRAVENOUS | Status: DC | PRN
  Administered 2019-08-17: 100 mg via INTRAVENOUS

## 2019-08-17 MED ORDER — PHENYLEPHRINE HCL (PRESSORS) 10 MG/ML IV SOLN
INTRAVENOUS | Status: DC | PRN
  Administered 2019-08-17 (×2): 100 ug via INTRAVENOUS

## 2019-08-17 SURGICAL SUPPLY — 31 items
BLADE SURG 15 STRL LF DISP TIS (BLADE) ×2 IMPLANT
BLADE SURG 15 STRL SS (BLADE) ×2
BNDG EYE OVAL (GAUZE/BANDAGES/DRESSINGS) IMPLANT
CANISTER SUCT 1200ML W/VALVE (MISCELLANEOUS) ×4 IMPLANT
CNTNR SPEC 2.5X3XGRAD LEK (MISCELLANEOUS)
COAG SUCT 10F 3.5MM HAND CTRL (MISCELLANEOUS) ×4 IMPLANT
CONT SPEC 4OZ STER OR WHT (MISCELLANEOUS)
CONTAINER SPEC 2.5X3XGRAD LEK (MISCELLANEOUS) IMPLANT
COVER WAND RF STERILE (DRAPES) ×4 IMPLANT
DRESSING NASL FOAM PST OP SINU (MISCELLANEOUS) ×2 IMPLANT
DRSG NASAL FOAM POST OP SINU (MISCELLANEOUS) ×4
GAUZE PACK 2X3YD (GAUZE/BANDAGES/DRESSINGS) ×4 IMPLANT
GLOVE BIO SURGEON STRL SZ7.5 (GLOVE) ×8 IMPLANT
GOWN STRL REUS W/ TWL LRG LVL3 (GOWN DISPOSABLE) ×4 IMPLANT
GOWN STRL REUS W/TWL LRG LVL3 (GOWN DISPOSABLE) ×4
KIT TURNOVER KIT A (KITS) ×4 IMPLANT
LABEL OR SOLS (LABEL) ×4 IMPLANT
NS IRRIG 500ML POUR BTL (IV SOLUTION) ×4 IMPLANT
PACK HEAD/NECK (MISCELLANEOUS) ×4 IMPLANT
PATTIES SURGICAL .5 X3 (DISPOSABLE) ×4 IMPLANT
SOL ANTI-FOG 6CC FOG-OUT (MISCELLANEOUS) IMPLANT
SOL FOG-OUT ANTI-FOG 6CC (MISCELLANEOUS)
SPLINT NASAL REUTER .5MM (MISCELLANEOUS) IMPLANT
SPLINT NASAL REUTER .5MM BIVLV (MISCELLANEOUS) ×4 IMPLANT
SPLINT NASAL SEPTAL PRE-CUT (MISCELLANEOUS) IMPLANT
SPONGE NEURO XRAY DETECT 1X3 (DISPOSABLE) ×4 IMPLANT
SUT CHROMIC 4 0 RB 1X27 (SUTURE) ×4 IMPLANT
SUT ETH BLK MONO 3 0 FS 1 12/B (SUTURE) ×4 IMPLANT
SYR 30ML LL (SYRINGE) ×4 IMPLANT
SYR 3ML LL SCALE MARK (SYRINGE) ×4 IMPLANT
WATER STERILE IRR 1000ML POUR (IV SOLUTION) ×4 IMPLANT

## 2019-08-17 NOTE — H&P (Signed)
..  History and Physical paper copy reviewed and updated date of procedure and will be scanned into system.  Patient seen and examined.  

## 2019-08-17 NOTE — Transfer of Care (Signed)
Immediate Anesthesia Transfer of Care Note  Patient: Terry Zhang  Procedure(s) Performed: SEPTOPLASTY (N/A ) TURBINATE REDUCTION (Bilateral )  Patient Location: PACU  Anesthesia Type:General  Level of Consciousness: sedated  Airway & Oxygen Therapy: Patient Spontanous Breathing and Patient connected to face mask oxygen  Post-op Assessment: Report given to RN and Post -op Vital signs reviewed and stable  Post vital signs: Reviewed and stable  Last Vitals:  Vitals Value Taken Time  BP 93/65 08/17/19 1013  Temp    Pulse 72 08/17/19 1014  Resp 10 08/17/19 1014  SpO2 91 % 08/17/19 1014  Vitals shown include unvalidated device data.  Last Pain:  Vitals:   08/17/19 0642  TempSrc: Temporal  PainSc: 0-No pain         Complications: No apparent anesthesia complications

## 2019-08-17 NOTE — Anesthesia Preprocedure Evaluation (Signed)
Anesthesia Evaluation  Patient identified by MRN, date of birth, ID band Patient awake    Reviewed: Allergy & Precautions, NPO status , Patient's Chart, lab work & pertinent test results, reviewed documented beta blocker date and time   History of Anesthesia Complications (+) history of anesthetic complications  Airway Mallampati: III  TM Distance: >3 FB     Dental  (+) Teeth Intact, Dental Advidsory Given   Pulmonary Current Smoker and Patient abstained from smoking., former smoker,    Pulmonary exam normal        Cardiovascular Exercise Tolerance: Good negative cardio ROS Normal cardiovascular exam     Neuro/Psych PSYCHIATRIC DISORDERS Anxiety Depression negative neurological ROS     GI/Hepatic negative GI ROS, Neg liver ROS,   Endo/Other  negative endocrine ROS  Renal/GU negative Renal ROS     Musculoskeletal   Abdominal   Peds  Hematology   Anesthesia Other Findings Obese.  Reproductive/Obstetrics                             Anesthesia Physical  Anesthesia Plan  ASA: III  Anesthesia Plan: General   Post-op Pain Management:    Induction: Intravenous  PONV Risk Score and Plan: 3 and Ondansetron, Dexamethasone, Midazolam, Promethazine and Treatment may vary due to age or medical condition  Airway Management Planned: Oral ETT  Additional Equipment:   Intra-op Plan:   Post-operative Plan: Extubation in OR  Informed Consent: I have reviewed the patients History and Physical, chart, labs and discussed the procedure including the risks, benefits and alternatives for the proposed anesthesia with the patient or authorized representative who has indicated his/her understanding and acceptance.       Plan Discussed with: Anesthesiologist  Anesthesia Plan Comments:         Anesthesia Quick Evaluation

## 2019-08-17 NOTE — Op Note (Signed)
..08/17/2019  10:02 AM    Terry Zhang  [id number]    Pre-Op Dx:  Deviated Nasal Septum, Hypertrophic Inferior Turbinates  Post-op Dx: Same  Proc: Nasal Septoplasty, Bilateral Partial Reduction Inferior Turbinates   Surg:  Kacia Halley  Anes:  GOT  EBL:  18ml  Comp:  none  Findings: Bilateral inferior turbinate hypertrophy, Left sided septal deviation with impaction onto inferior turbinate.  Procedure: With the patient in a comfortable supine position,  general orotracheal anesthesia was induced without difficulty.  The patient received preoperative Afrin spray for topical decongestion and vasoconstriction.  At an appropriate level, the patient was placed in a semi-sitting position.  Nasal vibrissae were trimmed.   1% Xylocaine with 1:100,000 epinephrine, 3 cc's, was infiltrated into the anterior floor of the nose, into the nasal spine region, into the membranous columella, and finally into the submucoperichondrial plane of the septum on both sides.  Several minutes were allowed for this to take effect.  Cottoniod pledgetts soaked in Afrin were placed into both nasal cavities and left while the patient was prepped and draped in the standard fashion.   A proper time-out was performed.  The materials were removed from the nose and observed to be intact and correct in number.  The nose was inspected with a headlight and zero degree endoscope with the findings as described above.  A left Killian incision was sharply executed and carried down to the caudal edge of the quadrangular cartilage with a 15 blade scapel.  A mucoperichondrial flap was elelvated along the quadrangular plate back to the bony-cartilaginous junction using caudal elevator and freer elevator. The mucoperiostium was then elevated along the ethmoid plate and the vomer. An itracartilagenous incision was made using the freer elevator and a contralateral mucoperichondiral flap was elevated using a freer  elevator.  Care was taken to avoid any large rents or opposing rents in the mucoperichondrial flap.  Boney spurs of the vomer and maxillary crest were removed with Takahashi forceps.  The area of cartilagenous deviation was removed with combination of freer elevator and Takahashi forceps creating a widely patent nasal cavity as well as resolution of obstruction from the cartilagenous deviation. The mucosal flaps were placed back into their anatomic position to allow visualization of the airways. The septum now sat in the midline with an improved airway.  A 4-0 Chromic was used to close the [place] incision as well.   The inferior turbinates were then inspected.  Under endoscopic visualization, the inferior turbinates were infractured bilaterally with a Soil scientist.  A kelly clamp was attached to the anterior-inferior third of each inferior turbinate for approximately one minute.  Under endoscopic visualization, Tru-cutting forceps were used to remove the anterior-inferior third of each inferior turbinate.  Electrocautery was used to control bleeding in the area. The remaining turbinate was then outfractured to open up the airway further. There was no significant bleeding noted. The right turbinate was then trimmed and outfractured in a similar fashion.  The airways were then visualized and showed open passageways on both sides that were significantly improved compared to before surgery.       There was no signifcant bleeding. Nasal splints were applied to both sides of the septum using Xomed 0.46mm regular sized splints that were trimmed, and then held in position with a 3-0 Nylon through and through suture.  Stamberger sinufoam was placed along the cut edge of the inferior turbinates bilaterally.  The patient was turned back over to anesthesia, and awakened,  extubated, and taken to the PACU in satisfactory condition.  Dispo:   PACU to home  Plan: Ice, elevation, narcotic analgesia, and  prophylactic antibiotics for the duration of indwelling nasal foreign bodies.  We will reevaluate the patient in the office in 7 days and remove the septal splints.  Return to work in 10 days, strenuous activities in two weeks.   Amery Minasyan 08/17/2019 10:02 AM

## 2019-08-17 NOTE — OR Nursing (Signed)
Pt Augmentin script not signed call Dr. Pryor Ochoa states he will send to patients pharmacy via his office. The script will not show in epic.

## 2019-08-17 NOTE — Anesthesia Post-op Follow-up Note (Signed)
Anesthesia QCDR form completed.        

## 2019-08-17 NOTE — Anesthesia Postprocedure Evaluation (Signed)
Anesthesia Post Note  Patient: Veneda Melter  Procedure(s) Performed: SEPTOPLASTY (N/A ) TURBINATE REDUCTION (Bilateral )  Patient location during evaluation: PACU Anesthesia Type: General Level of consciousness: awake and alert Pain management: pain level controlled Vital Signs Assessment: post-procedure vital signs reviewed and stable Respiratory status: spontaneous breathing, nonlabored ventilation, respiratory function stable and patient connected to nasal cannula oxygen Cardiovascular status: blood pressure returned to baseline and stable Postop Assessment: no apparent nausea or vomiting Anesthetic complications: no     Last Vitals:  Vitals:   08/17/19 1056 08/17/19 1123  BP: 115/74 118/63  Pulse: 73 71  Resp: 18 16  Temp: (!) 36.3 C   SpO2: 99% 99%    Last Pain:  Vitals:   08/17/19 1123  TempSrc:   PainSc: 4                  Martha Clan

## 2019-08-17 NOTE — Anesthesia Procedure Notes (Signed)
Procedure Name: Intubation Date/Time: 08/17/2019 8:46 AM Performed by: Justus Memory, CRNA Pre-anesthesia Checklist: Patient identified, Patient being monitored, Timeout performed, Emergency Drugs available and Suction available Patient Re-evaluated:Patient Re-evaluated prior to induction Oxygen Delivery Method: Circle system utilized Preoxygenation: Pre-oxygenation with 100% oxygen Induction Type: IV induction Ventilation: Mask ventilation without difficulty Laryngoscope Size: Mac and 3 Grade View: Grade I Tube type: Oral Tube size: 7.0 mm Number of attempts: 1 Airway Equipment and Method: Stylet Placement Confirmation: ETT inserted through vocal cords under direct vision,  positive ETCO2 and breath sounds checked- equal and bilateral Secured at: 23 cm Tube secured with: Tape Dental Injury: Teeth and Oropharynx as per pre-operative assessment

## 2019-08-17 NOTE — Progress Notes (Signed)
Ch visited pt during rounds. Pt had a positive affect and was excited to have her deviated septum surgery. Pt shared that she begun to have issues after giving birth to her second child who is now 22 months old. Pt has been using Afirin several times a day and shared how she has been exhausted because she has to use Afrin in the middle of the night in order to breath through her nose. 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 Pt is hoping to recover well post-op and have an improved quality of life. Pt presents to be in overall in good health.  No further needs at this time.    08/17/19 0800  Clinical Encounter Type  Visited With Patient;Health care provider  Visit Type Psychological support;Spiritual support;Social support;Pre-op  Spiritual Encounters  Spiritual Needs Emotional;Grief support  Stress Factors  Patient Stress Factors Health changes  Family Stress Factors Major life changes

## 2019-08-17 NOTE — Discharge Instructions (Signed)
How to Perform a Sinus Rinse A sinus rinse is a home treatment. It rinses your sinuses with a mixture of salt and water (saline solution). Sinuses are air-filled spaces in your skull behind the bones of your face and forehead. They open into your nasal cavity. A sinus rinse can help to clear your nasal cavity. It can clear mucus, dirt, dust, or pollen. You may do a sinus rinse when you have:  A cold.  A virus.  Allergies.  A sinus infection.  A stuffy nose. Talk with your doctor about whether a sinus rinse might help you. What are the risks? A sinus rinse is normally very safe and helpful. However, there are a few risks. These include:  A burning feeling in the sinuses. This may happen if you do not make the saline solution as instructed. Be sure to follow all directions when making the saline solution.  Nasal irritation.  Infection from unclean water. This is rare, but possible. Do not do a sinus rinse if you have had:  Ear or nasal surgery.  An ear infection.  Blocked ears. Supplies needed:  Saline solution or powder.  Distilled or germ-free (sterile) water may be needed to mix with saline powder. ? You may use boiled and cooled tap water. Boil tap water for 5 minutes; cool until it is lukewarm. Use within 24 hours. ? Do not use regular tap water to mix with the saline solution.  Neti pot or nasal rinse bottle. This releases the saline solution into your nose and through your sinuses. You can buy neti pots and rinse bottles: ? At your local pharmacy. ? At a health food store. ? Online. How to perform a sinus rinse  1. Wash your hands with soap and water. 2. Wash your device using the directions that came with it. 3. Dry your device. 4. Use the solution that comes with your device or one that is sold separately in stores. Follow the mixing directions on the package if you need to mix with sterile or distilled water. 5. Fill your device with the amount of saline  solution stated in the device instructions. 6. Stand over a sink and tilt your head sideways over the sink. 7. Place the spout of the device in your upper nostril (the one closer to the ceiling). 8. Gently pour or squeeze the saline solution into your nasal cavity. The liquid should drain to your lower nostril if you are not too stuffed up (congested). 9. While rinsing, breathe through your open mouth. 10. Gently blow your nose to clear any mucus and rinse solution. Blowing too hard may cause ear pain. 11. Repeat in your other nostril. 12. Clean and rinse your device with clean water. 13. Air-dry your device. Talk with your doctor or pharmacist if you have questions about how to do a sinus rinse. Summary  A sinus rinse is a home treatment. It rinses your sinuses with a mixture of salt and water (saline solution).  A sinus rinse is normally very safe and helpful. Follow all instructions carefully.  Talk with your doctor about whether a sinus rinse might help you. This information is not intended to replace advice given to you by your health care provider. Make sure you discuss any questions you have with your health care provider. Document Released: 07/13/2014 Document Revised: 10/13/2017 Document Reviewed: 10/13/2017 Elsevier Patient Education  2020 [employer]   1) The drugs that you were given will stay in  your system until tomorrow so for the next 24 hours you should not:  A) Drive an automobile B) Make any legal decisions C) Drink any alcoholic beverage   2) You may resume regular meals tomorrow.  Today it is better to start with liquids and gradually work up to solid foods.  You may eat anything you prefer, but it is better to start with liquids, then soup and crackers, and gradually work up to solid foods.   3) Please notify your doctor immediately if you have any unusual bleeding, trouble breathing, redness and pain at the  surgery site, drainage, fever, or pain not relieved by medication.    4) Additional Instructions:        Please contact your physician with any problems or Same Day Surgery at [phone], Monday through Friday 6 am to 4 pm, or [place] [facility] Main number at [phone].

## 2020-01-15 ENCOUNTER — Other Ambulatory Visit: Payer: Self-pay | Admitting: Obstetrics and Gynecology

## 2020-01-15 DIAGNOSIS — Z30011 Encounter for initial prescription of contraceptive pills: Secondary | ICD-10-CM

## 2020-01-18 ENCOUNTER — Telehealth: Payer: Self-pay

## 2020-01-18 ENCOUNTER — Other Ambulatory Visit: Payer: Self-pay

## 2020-01-18 DIAGNOSIS — F419 Anxiety disorder, unspecified: Secondary | ICD-10-CM

## 2020-01-18 DIAGNOSIS — F329 Major depressive disorder, single episode, unspecified: Secondary | ICD-10-CM

## 2020-01-18 DIAGNOSIS — Z30011 Encounter for initial prescription of contraceptive pills: Secondary | ICD-10-CM

## 2020-01-18 NOTE — Telephone Encounter (Signed)
Pt called stating she requested a refill on her birthcontrol. It was denied through pharmacy. [place] Dec, please call pt and get her scheduled for an annual in February. Please send msg to that provider stating when she is scheduled and refill can be sent in. thanks

## 2020-01-19 ENCOUNTER — Other Ambulatory Visit: Payer: Self-pay

## 2020-01-19 DIAGNOSIS — Z30011 Encounter for initial prescription of contraceptive pills: Secondary | ICD-10-CM

## 2020-01-19 MED ORDER — NORGESTIMATE-ETH ESTRADIOL 0.25-35 MG-MCG PO TABS: 1.0000 | ORAL_TABLET | Freq: Every day | ORAL | 0 refills | Status: DC

## 2020-01-19 NOTE — Telephone Encounter (Signed)
Patient is schedule for 02/20/19 at 10:30 with SDJ

## 2020-01-19 NOTE — Telephone Encounter (Signed)
Refill sent in for pt to last to appt

## 2020-02-19 LAB — HM PAP SMEAR: HM Pap smear: NORMAL

## 2020-02-21 ENCOUNTER — Ambulatory Visit (INDEPENDENT_AMBULATORY_CARE_PROVIDER_SITE_OTHER): Payer: Managed Care, Other (non HMO) | Admitting: Obstetrics and Gynecology

## 2020-02-21 ENCOUNTER — Other Ambulatory Visit: Payer: Self-pay

## 2020-02-21 ENCOUNTER — Encounter: Payer: Self-pay | Admitting: Obstetrics and Gynecology

## 2020-02-21 VITALS — BP 106/70 | HR 106 | Ht 68.0 in | Wt 279.0 lb

## 2020-02-21 DIAGNOSIS — Z01419 Encounter for gynecological examination (general) (routine) without abnormal findings: Secondary | ICD-10-CM | POA: Diagnosis not present

## 2020-02-21 DIAGNOSIS — F419 Anxiety disorder, unspecified: Secondary | ICD-10-CM

## 2020-02-21 DIAGNOSIS — F17210 Nicotine dependence, cigarettes, uncomplicated: Secondary | ICD-10-CM

## 2020-02-21 DIAGNOSIS — Z124 Encounter for screening for malignant neoplasm of cervix: Secondary | ICD-10-CM

## 2020-02-21 DIAGNOSIS — F329 Major depressive disorder, single episode, unspecified: Secondary | ICD-10-CM

## 2020-02-21 DIAGNOSIS — Z1339 Encounter for screening examination for other mental health and behavioral disorders: Secondary | ICD-10-CM

## 2020-02-21 DIAGNOSIS — Z1331 Encounter for screening for depression: Secondary | ICD-10-CM

## 2020-02-21 MED ORDER — ALPRAZOLAM 0.5 MG PO TABS: 0.5000 mg | ORAL_TABLET | Freq: Two times a day (BID) | ORAL | 0 refills | Status: DC | PRN

## 2020-02-21 NOTE — Progress Notes (Signed)
Gynecology Annual Exam  PCP: Soundra Pilon, FNP  Chief Complaint  Patient presents with  . Gynecologic Exam   History of Present Illness:  Terry Zhang is a 29 y.o. [id number] who LMP was Patient's last menstrual period was 02/11/2020., presents today for her annual examination.  Her menses are regular every 28-30 days, lasting 4 day(s).  Dysmenorrhea mild, occurring premenstrually. She does not have intermenstrual bleeding.  She is sexually active.  No issues or pain with intercourse.  Last Pap: 2 years  Results were: no abnormalities /neg HPV DNA not done Hx of STDs: none  There is no FH of breast cancer. There is a FH of cervical cancer.  Her MGF had colon cancer.  Her MGM had pancreatic cancer at age 16.  The patient does not do self-breast exams.  Tobacco use: she uses 1/4 pack per day Alcohol use: social drinker Exercise: chasing her 35 month old around  The patient wears seatbelts: yes.   The patient reports that domestic violence in her life is absent.   Her anxiety is controlled.  She takes Zoloft daily, 100 mg.  She has an appointment with PCP.  She took xanax about 3 weeks ago.   Past Medical History:  Diagnosis Date  . Anxiety   . Complication of anesthesia    BP DROPPED WITH C/S BUT NOT GENERAL  . Depression   . Hyperlipidemia 02/23/2016  . Obesity     Past Surgical History:  Procedure Laterality Date  . ANKLE SURGERY    . BREAST BIOPSY  2003  . CESAREAN SECTION N/A 12/28/2018   Procedure: CESAREAN SECTION;  Surgeon: Conard Novak, MD;  Location: [facility] ORS;  Service: Obstetrics;  Laterality: N/A;  . SEPTOPLASTY N/A 08/17/2019   Procedure: SEPTOPLASTY;  Surgeon: Bud Face, MD;  Location: [facility] OR[facility];  Service: ENT;  Laterality: N/A;  . TONSILLECTOMY    . TURBINATE REDUCTION Bilateral 08/17/2019   Procedure: TURBINATE REDUCTION;  Surgeon: Bud Face, MD;  Location: [facility] OR[facility];  Service: ENT;  Laterality: Bilateral;    Prior to Admission  medications   Medication Sig Start Date End Date Taking? Authorizing Provider  ALPRAZolam Prudy Feeler) 0.5 MG tablet Take 1 tablet (0.5 mg total) by mouth 2 (two) times daily as needed for anxiety. 08/12/19  Yes Conard Novak, MD  norgestimate-ethinyl estradiol (SPRINTEC 28) 0.25-35 MG-MCG tablet Take 1 tablet by mouth daily. 01/19/20 04/12/20 Yes Conard Novak, MD  sertraline (ZOLOFT) 50 MG tablet TAKE 1 TABLET BY MOUTH  DAILY Patient taking differently: Take 50 mg by mouth at bedtime.  07/30/19  Yes Tresea Mall, CNM    Allergies  Allergen Reactions  . Montelukast Palpitations    anxiety    Obstetric History: [id number]  Social History   Socioeconomic History  . Marital status: Married    Spouse name: Not on file  . Number of children: Not on file  . Years of education: Not on file  . Highest education level: Not on file  Occupational History  . Not on file  Tobacco Use  . Smoking status: Current Every Day Smoker    Packs/day: 0.25    Types: Cigarettes  . Smokeless tobacco: Never Used  Substance and Sexual Activity  . Alcohol use: No    Alcohol/week: 0.0 standard drinks  . Drug use: No  . Sexual activity: Yes    Birth control/protection: Pill  Other Topics Concern  . Not on file  Social History Narrative   Married.  1 daughter.   Works as a Writer at [employer].   Once enjoyed playing sports, going to [employer], swimming.   Social Determinants of Health   Financial Resource Strain:   . Difficulty of Paying Living Expenses: Not on file  Food Insecurity:   . Worried About Programme researcher, broadcasting/film/video in the Last Year: Not on file  . Ran Out of Food in the Last Year: Not on file  Transportation Needs:   . Lack of Transportation (Medical): Not on file  . Lack of Transportation (Non-Medical): Not on file  Physical Activity:   . Days of Exercise per Week: Not on file  . Minutes of Exercise per Session: Not on file  Stress:   . Feeling of Stress : Not on file   Social Connections:   . Frequency of Communication with Friends and Family: Not on file  . Frequency of Social Gatherings with Friends and Family: Not on file  . Attends Religious Services: Not on file  . Active Member of Clubs or Organizations: Not on file  . Attends Banker Meetings: Not on file  . Marital Status: Not on file  Intimate Partner Violence:   . Fear of Current or Ex-Partner: Not on file  . Emotionally Abused: Not on file  . Physically Abused: Not on file  . Sexually Abused: Not on file    Family History  Problem Relation Age of Onset  . Arthritis Mother   . Cancer Mother 26       cervical  . Hyperlipidemia Mother   . Mental illness Mother   . Diabetes Mother   . Hyperlipidemia Father   . Hypertension Father   . Mental illness Father   . Mental illness Maternal Grandfather   . Diabetes Maternal Grandfather   . Pancreatic cancer Maternal Grandmother 73    Review of Systems  Constitutional: Negative.   HENT: Negative.   Eyes: Negative.   Respiratory: Negative.   Cardiovascular: Negative.   Gastrointestinal: Negative.   Genitourinary: Negative.   Musculoskeletal: Negative.   Skin: Negative.   Neurological: Negative.   Endo/Heme/Allergies: Positive for environmental allergies. Negative for polydipsia. Does not bruise/bleed easily.  Psychiatric/Behavioral: Positive for depression. Negative for hallucinations, memory loss, substance abuse and suicidal ideas. The patient is nervous/anxious. The patient does not have insomnia.      Physical Exam BP 106/70   Pulse (!) 106   Ht 5\' 8"  (1.727 m)   Wt 279 lb (126.6 kg)   LMP 02/11/2020   BMI 42.42 kg/m    Physical Exam Constitutional:      General: She is not in acute distress.    Appearance: Normal appearance. She is well-developed.  Genitourinary:     Pelvic exam was performed with patient supine.     Vulva, urethra, bladder and uterus normal.     No inguinal adenopathy present in the  right or left side.    No signs of injury in the vagina.     No vaginal discharge, erythema, tenderness or bleeding.     No cervical motion tenderness, discharge, lesion or polyp.     Uterus is mobile.     Uterus is not enlarged or tender.     No uterine mass detected.    Uterus is anteverted.     No right or left adnexal mass present.     Right adnexa not tender or full.     Left adnexa not tender or full.  HENT:  Head: Normocephalic and atraumatic.  Eyes:     General: No scleral icterus.    Conjunctiva/sclera: Conjunctivae normal.  Neck:     Thyroid: No thyromegaly.  Cardiovascular:     Rate and Rhythm: Normal rate and regular rhythm.     Heart sounds: No murmur. No friction rub. No gallop.   Pulmonary:     Effort: Pulmonary effort is normal. No respiratory distress.     Breath sounds: Normal breath sounds. No wheezing or rales.  Chest:     Breasts:        Right: No inverted nipple, mass, nipple discharge, skin change or tenderness.        Left: No inverted nipple, mass, nipple discharge, skin change or tenderness.  Abdominal:     General: Bowel sounds are normal. There is no distension.     Palpations: Abdomen is soft. There is no mass.     Tenderness: There is no abdominal tenderness. There is no guarding or rebound.  Musculoskeletal:        General: No swelling or tenderness. Normal range of motion.     Cervical back: Normal range of motion and neck supple.  Lymphadenopathy:     Cervical: No cervical adenopathy.     Lower Body: No right inguinal adenopathy. No left inguinal adenopathy.  Neurological:     General: No focal deficit present.     Mental Status: She is alert and oriented to person, place, and time.     Cranial Nerves: No cranial nerve deficit.  Skin:    General: Skin is warm and dry.     Findings: No erythema or rash.  Psychiatric:        Mood and Affect: Mood normal.        Behavior: Behavior normal.        Judgment: Judgment normal.   Female  chaperone present for pelvic and breast  portions of the physical exam  Results: AUDIT Questionnaire (screen for alcoholism): 1 PHQ-9: 3  Assessment: 29 y.o. G60P1102 female here for routine annual gynecologic examination  Plan: Problem List Items Addressed This Visit      Other   Anxiety and depression   Relevant Medications   ALPRAZolam (XANAX) 0.5 MG tablet    Other Visit Diagnoses    Women's annual routine gynecological examination    -  Primary   Relevant Medications   ALPRAZolam (XANAX) 0.5 MG tablet   Other Relevant Orders   IGP, Aptima HPV, rfx 16/18,45   Screening for depression       Screening for alcoholism       Pap smear for cervical cancer screening       Relevant Orders   IGP, Aptima HPV, rfx 16/18,45   Anxiety       Relevant Medications   ALPRAZolam (XANAX) 0.5 MG tablet     Screening: -- Blood pressure screen normal -- Weight screening: obese: discussed management options, including lifestyle, dietary, and exercise.  She is taking phentermine for weight loss.  -- Depression screening negative (PHQ-9) -- Nutrition: normal -- cholesterol screening: not due for screening -- osteoporosis screening: not due -- tobacco screening: using: discussed quitting using the 5 A's -- alcohol screening: AUDIT questionnaire indicates low-risk usage. -- family history of breast cancer screening: done. not at high risk. -- no evidence of domestic violence or intimate partner violence. -- STD screening: gonorrhea/chlamydia NAAT not collected per patient request. -- pap smear collected per ASCCP guidelines -- flu vaccine received this season  at work -- HPV vaccination series: received  Thomasene Mohair, MD 02/21/2020 11:08 AM

## 2020-02-22 ENCOUNTER — Encounter: Payer: Self-pay | Admitting: Family Medicine

## 2020-02-22 ENCOUNTER — Other Ambulatory Visit: Payer: Self-pay

## 2020-02-22 ENCOUNTER — Ambulatory Visit: Payer: Managed Care, Other (non HMO) | Admitting: Family Medicine

## 2020-02-22 VITALS — BP 118/76 | HR 96 | Temp 98.7°F | Resp 14 | Ht 68.0 in | Wt 280.2 lb

## 2020-02-22 DIAGNOSIS — F419 Anxiety disorder, unspecified: Secondary | ICD-10-CM | POA: Diagnosis not present

## 2020-02-22 DIAGNOSIS — F331 Major depressive disorder, recurrent, moderate: Secondary | ICD-10-CM

## 2020-02-22 DIAGNOSIS — Z7689 Persons encountering health services in other specified circumstances: Secondary | ICD-10-CM

## 2020-02-22 DIAGNOSIS — Z5181 Encounter for therapeutic drug level monitoring: Secondary | ICD-10-CM

## 2020-02-22 DIAGNOSIS — E782 Mixed hyperlipidemia: Secondary | ICD-10-CM | POA: Diagnosis not present

## 2020-02-22 DIAGNOSIS — E559 Vitamin D deficiency, unspecified: Secondary | ICD-10-CM

## 2020-02-22 MED ORDER — SERTRALINE HCL 100 MG PO TABS: 100.0000 mg | ORAL_TABLET | Freq: Every day | ORAL | 3 refills | Status: DC

## 2020-02-22 MED ORDER — BUSPIRONE HCL 5 MG PO TABS: 5.0000 mg | ORAL_TABLET | Freq: Three times a day (TID) | ORAL | 1 refills | Status: DC | PRN

## 2020-02-22 NOTE — Progress Notes (Signed)
Name: Terry Zhang   MRN: [id number]    DOB: 03-11-1991   Date:02/22/2020       Progress Note  Chief Complaint  Patient presents with  . New Patient (Initial Visit)  . Labs Only    last time chol. checked through [facility] was elevated.  labs in care everywhere ([facility])     Subjective:   Terry Zhang is a 29 y.o. female, presents to clinic for routine follow up on the conditions listed above.  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 Marella Bile, she had increased nasal congestion during pregnancy which led to the ENT referral  She is interested in repeating labs for cholesterol High cholesterol 11/26/2019  Total cholesterol 280, trig 241, HDL 44, LDL 189 Vit D deficient 11/26/2019 19.3 - taking OTC Vit D 3  Obesity - BMI 42.60 - on phentermine taking half doses BID to help her tolerate the SE, takes second dose by 2 pm, if she takes later she cannot sleep.  She denies any CP, SOB, palpitations.    Hx of Anxiety treated with zoloft and xanax was per Dr. Glennon Mac (OBGYN)  - with her last pregnancy she dropped the dose down to 50 mg while pregnant, she feels she needs to increase the dose of zoloft due to worsening anxeity sx.   She gets xanax only a few for half the year, she will continue to get from Dr. Glennon Mac.   GAD 7 : Generalized Anxiety Score 02/22/2020 08/12/2019  Nervous, Anxious, on Edge 1 2  Control/stop worrying 0 2  Worry too much - different things 3 1  Trouble relaxing 1 2  Restless 0 1  Easily annoyed or irritable 1 1  Afraid - awful might happen 0 0  Total GAD 7 Score 6 9  Anxiety Difficulty Not difficult at all -  Main sx is that she worries excessively about everything. She was previously on much  higher dose of zoloft, so she feels like sx will be better if she can increase the dose.    Patient Active Problem List   Diagnosis Date Noted  . Fetal macrosomia 12/28/2018  . PROM (premature rupture of membranes) 12/27/2018  . Vasovagal episode 12/22/2018  . Elevated blood pressure affecting pregnancy in third trimester, antepartum 12/13/2018  . Gestational hypertension 12/11/2018  . Indication for care in labor and delivery, antepartum 12/08/2018  . Paroxysmal tachycardia ([place]) 07/10/2018  . Obesity in pregnancy 05/12/2018  . History of preterm delivery, currently pregnant 05/12/2018  . Supervision of high risk pregnancy, antepartum 05/06/2018  . Hyperlipidemia 02/23/2016  . Anxiety and depression 08/03/2015    Past Surgical History:  Procedure Laterality Date  . ANKLE SURGERY    . BREAST BIOPSY  2003  . CESAREAN SECTION N/A 12/28/2018   Procedure: CESAREAN SECTION;  Surgeon: Will Bonnet, MD;  Location: [facility] OR[facility];  Service: Obstetrics;  Laterality: N/A;  . SEPTOPLASTY N/A 08/17/2019   Procedure: SEPTOPLASTY;  Surgeon: Carloyn Manner, MD;  Location: [facility];  Service: ENT;  Laterality: N/A;  . TONSILLECTOMY    . TURBINATE REDUCTION Bilateral 08/17/2019   Procedure: TURBINATE REDUCTION;  Surgeon: Carloyn Manner, MD;  Location: [facility] OR[facility];  Service: ENT;  Laterality: Bilateral;  Family History  Problem Relation Age of Onset  . Arthritis Mother   . Cancer Mother 2       cervical  . Hyperlipidemia Mother   . Mental illness Mother   . Diabetes Mother   . Hyperlipidemia Father   . Hypertension Father   . Mental illness Father   . Diabetes Father        pre  . Mental illness Maternal Grandfather   . Diabetes Maternal Grandfather   . Pancreatic cancer Maternal Grandmother 35    Social History   Tobacco Use  . Smoking status: Current Every Day Smoker    Packs/day: 0.25    Types: Cigarettes  . Smokeless tobacco: Never Used  Substance Use Topics  .  Alcohol use: No    Alcohol/week: 0.0 standard drinks  . Drug use: No      Current Outpatient Medications:  .  ALPRAZolam (XANAX) 0.5 MG tablet, Take 1 tablet (0.5 mg total) by mouth 2 (two) times daily as needed for anxiety., Disp: 30 tablet, Rfl: 0 .  norgestimate-ethinyl estradiol (SPRINTEC 28) 0.25-35 MG-MCG tablet, Take 1 tablet by mouth daily., Disp: 84 tablet, Rfl: 0 .  phentermine (ADIPEX-P) 37.5 MG tablet, Take 18.75 mg by mouth 2 (two) times daily., Disp: , Rfl:  .  sertraline (ZOLOFT) 50 MG tablet, TAKE 1 TABLET BY MOUTH  DAILY (Patient taking differently: Take 50 mg by mouth at bedtime. ), Disp: 90 tablet, Rfl: 3  Allergies  Allergen Reactions  . Montelukast Palpitations    anxiety    Chart Review Today: I personally reviewed active problem list, medication list, allergies, family history, social history, health maintenance, notes from last encounter, lab results, imaging with the patient/caregiver today.  Review of Systems  10 Systems reviewed and are negative for acute change except as noted in the HPI.   Objective:    Vitals:   02/22/20 0942  BP: 118/76  Pulse: 96  Resp: 14  Temp: 98.7 F (37.1 C)  SpO2: 99%  Weight: 280 lb 3.2 oz (127.1 kg)  Height: 5\' 8"  (1.727 m)    Body mass index is 42.6 kg/m.  Physical Exam Vitals and nursing note reviewed.  Constitutional:      General: She is not in acute distress.    Appearance: Normal appearance. She is well-developed. She is not ill-appearing, toxic-appearing or diaphoretic.     Interventions: Face mask in place.  HENT:     Head: Normocephalic and atraumatic.     Right Ear: External ear normal.     Left Ear: External ear normal.  Eyes:     General: Lids are normal. No scleral icterus.       Right eye: No discharge.        Left eye: No discharge.     Conjunctiva/sclera: Conjunctivae normal.  Neck:     Trachea: Phonation normal. No tracheal deviation.  Cardiovascular:     Rate and Rhythm: Normal rate  and regular rhythm.     Pulses: Normal pulses.          Radial pulses are 2+ on the right side and 2+ on the left side.       Posterior tibial pulses are 2+ on the right side and 2+ on the left side.     Heart sounds: Normal heart sounds. No murmur. No friction rub. No gallop.   Pulmonary:     Effort: Pulmonary effort is normal. No respiratory distress.     Breath sounds: Normal  breath sounds. No stridor. No wheezing, rhonchi or rales.  Chest:     Chest wall: No tenderness.  Abdominal:     General: Bowel sounds are normal. There is no distension.     Palpations: Abdomen is soft.     Tenderness: There is no abdominal tenderness. There is no guarding or rebound.  Musculoskeletal:        General: No deformity. Normal range of motion.     Cervical back: Normal range of motion and neck supple.     Right lower leg: No edema.     Left lower leg: No edema.  Lymphadenopathy:     Cervical: No cervical adenopathy.  Skin:    General: Skin is warm and dry.     Capillary Refill: Capillary refill takes less than 2 seconds.     Coloration: Skin is not jaundiced or pale.     Findings: No rash.  Neurological:     Mental Status: She is alert and oriented to person, place, and time.     Motor: No abnormal muscle tone.     Gait: Gait normal.  Psychiatric:        Mood and Affect: Mood normal.        Speech: Speech normal.        Behavior: Behavior normal.         PHQ2/9:  Depression screen [facility] 2/9  02/22/2020  02/21/2020  08/03/2015   Decreased Interest  1  0  3   Down, Depressed, Hopeless  0  0  2   PHQ - 2 Score  1  0  5   Altered sleeping  1  1  3    Tired, decreased energy  3  2  3    Change in appetite  0  0  0   Feeling bad or failure about yourself   0  0  0   Trouble concentrating  1  0  3   Moving slowly or fidgety/restless  0  0  0   Suicidal thoughts  0  0  0   PHQ-9 Score  6  3  14    Difficult doing work/chores  Somewhat difficult   Somewhat difficult  Very difficult     phq 9 is positive, sx a little worse - see HPI and A&P  Fall Risk: Fall Risk  02/22/2020  Falls in the past year? 0  Number falls in past yr: 0  Injury with Fall? 0    Functional Status Survey: Is the patient deaf or have difficulty hearing?: No Does the patient have difficulty seeing, even when wearing glasses/contacts?: No Does the patient have difficulty concentrating, remembering, or making decisions?: No Does the patient have difficulty walking or climbing stairs?: No Does the patient have difficulty dressing or bathing?: No Does the patient have difficulty doing errands alone such as visiting a doctor's office or shopping?: No   Assessment & Plan:      ICD-10-CM   1. Mixed hyperlipidemia  E78.2 Lipid panel    Comprehensive metabolic panel   recheck labs since pt has been working on diet and weight loss  2. Moderate episode of recurrent major depressive disorder (HCC)  F33.1 sertraline (ZOLOFT) 100 MG tablet    busPIRone (BUSPAR) 5 MG tablet   increase dose of zoloft  3. Anxiety disorder, unspecified type  F41.9 sertraline (ZOLOFT) 100 MG tablet    busPIRone (BUSPAR) 5 MG tablet   increase zoloft dose slowly, add buspar, discussed coping  mechanisms  4. Encounter to establish care with new doctor  Z76.89   5. Vitamin D deficiency  E55.9 VITAMIN D 25 Hydroxy (Vit-D Deficiency, Fractures)   recheck  6. Encounter for medication monitoring  Z51.81 VITAMIN D 25 Hydroxy (Vit-D Deficiency, Fractures)    Lipid panel    Comprehensive metabolic panel   Discussed SSRI dose changes  and she is agreeable Dosing as noted above: Currently denies SI, HI, AVH, no concern for danger to self or others, appears stable for initiation of outpt tx  Will do close f/up on MDD/anxiety/med changes  Return for 6 week virtual follow-up .   Danelle Berry, PA-C 02/22/20 10:15 AM

## 2020-02-22 NOTE — Patient Instructions (Signed)
Increase your dose of zoloft to 100 mg and new Rx should come in the mail.

## 2020-02-23 LAB — IGP, APTIMA HPV, RFX 16/18,45: HPV Aptima: NEGATIVE

## 2020-03-02 LAB — COMPREHENSIVE METABOLIC PANEL
ALT: 12 IU/L (ref 0–32)
AST: 11 IU/L (ref 0–40)
Albumin/Globulin Ratio: 1.8 (ref 1.2–2.2)
Albumin: 4.2 g/dL (ref 3.9–5.0)
Alkaline Phosphatase: 59 IU/L (ref 39–117)
BUN/Creatinine Ratio: 17 (ref 9–23)
BUN: 11 mg/dL (ref 6–20)
Bilirubin Total: <0.2 mg/dL (ref 0.0–1.2)
CO2: 18 mmol/L — ABNORMAL LOW (ref 20–29)
Calcium: 9.6 mg/dL (ref 8.7–10.2)
Chloride: 107 mmol/L — ABNORMAL HIGH (ref 96–106)
Creatinine, Ser: 0.66 mg/dL (ref 0.57–1.00)
GFR calc Af Amer: 137 mL/min/{1.73_m2} (ref >59)
GFR calc non Af Amer: 119 mL/min/{1.73_m2} (ref >59)
Globulin, Total: 2.4 g/dL (ref 1.5–4.5)
Glucose: 106 mg/dL — ABNORMAL HIGH (ref 65–99)
Potassium: 4.7 mmol/L (ref 3.5–5.2)
Sodium: 141 mmol/L (ref 134–144)
Total Protein: 6.6 g/dL (ref 6.0–8.5)

## 2020-03-02 LAB — LIPID PANEL
Chol/HDL Ratio: 4.8 ratio — ABNORMAL HIGH (ref 0.0–4.4)
Cholesterol, Total: 228 mg/dL — ABNORMAL HIGH (ref 100–199)
HDL: 48 mg/dL (ref >39)
LDL Chol Calc (NIH): 146 mg/dL — ABNORMAL HIGH (ref 0–99)
Triglycerides: 188 mg/dL — ABNORMAL HIGH (ref 0–149)
VLDL Cholesterol Cal: 34 mg/dL (ref 5–40)

## 2020-03-02 LAB — VITAMIN D 25 HYDROXY (VIT D DEFICIENCY, FRACTURES): Vit D, 25-Hydroxy: 20.2 ng/mL — ABNORMAL LOW (ref 30.0–100.0)

## 2020-03-02 MED ORDER — VITAMIN D (ERGOCALCIFEROL) 1.25 MG (50000 UNIT) PO CAPS: 50000.0000 [IU] | ORAL_CAPSULE | ORAL | 0 refills | Status: DC

## 2020-03-02 NOTE — Addendum Note (Signed)
Addended by: Danelle Berry on: 03/02/2020 03:02 PM   Modules accepted: Orders

## 2020-04-04 ENCOUNTER — Ambulatory Visit: Payer: Managed Care, Other (non HMO) | Admitting: Family Medicine

## 2020-04-09 ENCOUNTER — Other Ambulatory Visit: Payer: Self-pay | Admitting: Obstetrics and Gynecology

## 2020-04-09 DIAGNOSIS — Z30011 Encounter for initial prescription of contraceptive pills: Secondary | ICD-10-CM

## 2020-04-13 ENCOUNTER — Other Ambulatory Visit: Payer: Self-pay

## 2020-04-13 ENCOUNTER — Telehealth (INDEPENDENT_AMBULATORY_CARE_PROVIDER_SITE_OTHER): Payer: Managed Care, Other (non HMO) | Admitting: Family Medicine

## 2020-04-13 ENCOUNTER — Encounter: Payer: Self-pay | Admitting: Family Medicine

## 2020-04-13 VITALS — Ht 68.0 in | Wt 278.0 lb

## 2020-04-13 DIAGNOSIS — F419 Anxiety disorder, unspecified: Secondary | ICD-10-CM | POA: Diagnosis not present

## 2020-04-13 DIAGNOSIS — F331 Major depressive disorder, recurrent, moderate: Secondary | ICD-10-CM | POA: Diagnosis not present

## 2020-04-13 DIAGNOSIS — Z6838 Body mass index (BMI) 38.0-38.9, adult: Secondary | ICD-10-CM | POA: Insufficient documentation

## 2020-04-13 NOTE — Patient Instructions (Signed)
Continue your medications as previously prescribed:  Zoloft 100 mg daily  BuSpar 5 to 10 mg up to 3 times a day as needed for anxiety stress or agitation  Please contact me if you feel like you need a dose change.  You are welcome to use schedule follow-up appointment with [place] in 6 months for checking on anxiety and depression/meds etc. I know you were going to continue to do your Pap smear with Dr. Jean Rosenthal but you are also welcome to come here for your annual physical  Front office will call you to make a follow-up appointment

## 2020-04-13 NOTE — Progress Notes (Signed)
Name: Terry Zhang   MRN: [id number]    DOB: [birth year]   Date:04/13/2020       Progress Note  Subjective:    I connected with  Terry Zhang  on 04/13/20 at  1:40 PM EDT by a video enabled telemedicine application and verified that I am speaking with the correct person using two identifiers.  I discussed the limitations of evaluation and management by telemedicine and the availability of in person appointments. The patient expressed understanding and agreed to proceed. Staff also discussed with the patient that there may be a patient responsible charge related to this service. Patient Location: home Provider Location: [facility] clinic Additional Individuals present: none  Chief Complaint  Patient presents with  . Follow-up  . Depression    zoloft    Terry Zhang is a 29 y.o. female, presents for virtual visit for follow up on anxiety and depression with recent med changes.  Pt recently increased dose of zoloft to 100 mg daily, was previously on higher dose but with pregnancy with OBGYN She feels like her moods   Depression screen [facility] 2/9 04/13/2020 02/22/2020 02/21/2020  Decreased Interest 1 1 0  Down, Depressed, Hopeless 1 0 0  PHQ - 2 Score 2 1 0  Altered sleeping 0 1 1  Tired, decreased energy 3 3 2   Change in appetite 0 0 0  Feeling bad or failure about yourself  0 0 0  Trouble concentrating 0 1 0  Moving slowly or fidgety/restless 0 0 0  Suicidal thoughts 0 0 0  PHQ-9 Score 5 6 3   Difficult doing work/chores Somewhat difficult Somewhat difficult Somewhat difficult   GAD 7 : Generalized Anxiety Score 04/13/2020 02/22/2020 08/12/2019  Nervous, Anxious, on Edge 0 1 2  Control/stop worrying 0 0 2  Worry too much - different things 0 3 1  Trouble relaxing 1 1 2   Restless 0 0 1  Easily annoyed or irritable 0 1 1  Afraid - awful might happen 0 0 0  Total GAD 7 Score 1 6 9   Anxiety Difficulty Not difficult at all Not difficult at all -   She is taking buspar 5  mg prn - usually a few times a week or on more stressful days.  She has not taken multiple times a day or higher doses GAD7 score has decreased, anxiety sx and agitation significantly improved with increasing zoloft and taking buspar.  In the past 6 weeks she has only taken xanax (prescribed by gyn) 2 times.  She plans on trying 10 mg dose of buspar on those bad days to see if it helps her use it less.   Patient Active Problem List   Diagnosis Date Noted  . Fetal macrosomia 12/28/2018  . PROM (premature rupture of membranes) 12/27/2018  . Vasovagal episode 12/22/2018  . Elevated blood pressure affecting pregnancy in third trimester, antepartum 12/13/2018  . Gestational hypertension 12/11/2018  . Indication for care in labor and delivery, antepartum 12/08/2018  . Paroxysmal tachycardia (HCC) 07/10/2018  . Obesity in pregnancy 05/12/2018  . History of preterm delivery, currently pregnant 05/12/2018  . Supervision of high risk pregnancy, antepartum 05/06/2018  . Hyperlipidemia 02/23/2016  . Anxiety and depression 08/03/2015    Current Outpatient Medications:  .  ALPRAZolam (XANAX) 0.5 MG tablet, Take 1 tablet (0.5 mg total) by mouth 2 (two) times daily as needed for anxiety., Disp: 30 tablet, Rfl: 0 .  busPIRone (BUSPAR) 5 MG tablet, Take 1-2 tablets (5-10  mg total) by mouth every 8 (eight) hours as needed (for anxiety/agitation/stress sx)., Disp: 90 tablet, Rfl: 1 .  phentermine (ADIPEX-P) 37.5 MG tablet, Take 18.75 mg by mouth 2 (two) times daily., Disp: , Rfl:  .  sertraline (ZOLOFT) 100 MG tablet, Take 1 tablet (100 mg total) by mouth daily., Disp: 90 tablet, Rfl: 3 .  SPRINTEC 28 0.25-35 MG-MCG tablet, TAKE 1 TABLET BY MOUTH EVERY DAY, Disp: 28 tablet, Rfl: 2 .  Vitamin D, Ergocalciferol, (DRISDOL) 1.25 MG (50000 UNIT) CAPS capsule, Take 1 capsule (50,000 Units total) by mouth every 7 (seven) days. x12 weeks., Disp: 12 capsule, Rfl: 0 Allergies  Allergen Reactions  . Montelukast  Palpitations    anxiety    Past Surgical History:  Procedure Laterality Date  . ANKLE SURGERY    . BREAST BIOPSY  2003  . CESAREAN SECTION N/A 12/28/2018   Procedure: CESAREAN SECTION;  Surgeon: Conard Novak, MD;  Location: [facility] ORS;  Service: Obstetrics;  Laterality: N/A;  . SEPTOPLASTY N/A 08/17/2019   Procedure: SEPTOPLASTY;  Surgeon: Bud Face, MD;  Location: [facility] ORS;  Service: ENT;  Laterality: N/A;  . TONSILLECTOMY    . TURBINATE REDUCTION Bilateral 08/17/2019   Procedure: TURBINATE REDUCTION;  Surgeon: Bud Face, MD;  Location: [facility] ORS;  Service: ENT;  Laterality: Bilateral;   Family History  Problem Relation Age of Onset  . Arthritis Mother   . Cancer Mother 44       cervical  . Hyperlipidemia Mother   . Mental illness Mother   . Diabetes Mother   . Hyperlipidemia Father   . Hypertension Father   . Mental illness Father   . Diabetes Father        pre  . Mental illness Maternal Grandfather   . Diabetes Maternal Grandfather   . Pancreatic cancer Maternal Grandmother 40   Social History   Socioeconomic History  . Marital status: Married    Spouse name: Molly Maduro  . Number of children: 2  . Years of education: 54  . Highest education level: Some college, no degree  Occupational History  . Not on file  Tobacco Use  . Smoking status: Current Every Day Smoker    Packs/day: 0.25    Types: Cigarettes  . Smokeless tobacco: Never Used  Substance and Sexual Activity  . Alcohol use: No    Alcohol/week: 0.0 standard drinks  . Drug use: No  . Sexual activity: Yes    Birth control/protection: Pill  Other Topics Concern  . Not on file  Social History Narrative   Married.   1 daughter.   Works as a Writer at [employer].   Once enjoyed playing sports, going to [employer], swimming.   Social Determinants of Health   Financial Resource Strain:   . Difficulty of Paying Living Expenses:   Food Insecurity:   . Worried About Brewing technologist in the Last Year:   . Barista in the Last Year:   Transportation Needs:   . Freight forwarder (Medical):   Marland Kitchen Lack of Transportation (Non-Medical):   Physical Activity:   . Days of Exercise per Week:   . Minutes of Exercise per Session:   Stress:   . Feeling of Stress :   Social Connections:   . Frequency of Communication with Friends and Family:   . Frequency of Social Gatherings with Friends and Family:   . Attends Religious Services:   . Active Member of Clubs or  Organizations:   . Attends Banker Meetings:   Marland Kitchen Marital Status:   Intimate Partner Violence:   . Fear of Current or Ex-Partner:   . Emotionally Abused:   Marland Kitchen Physically Abused:   . Sexually Abused:     Chart Review Today: I personally reviewed active problem list, medication list, allergies, family history, social history, health maintenance, notes from last encounter, lab results, imaging with the patient/caregiver today.   Review of Systems  10 Systems reviewed and are negative for acute change except as noted in the HPI.   Objective:    Virtual encounter, vitals limited, only able to obtain the following Today's Vitals   04/13/20 1222  Weight: 278 lb (126.1 kg)  Height: 5\' 8"  (1.727 m)   Body mass index is 42.27 kg/m. Nursing Note and Vital Signs reviewed.  Physical Exam Vitals and nursing note reviewed.  Constitutional:      General: She is not in acute distress.    Appearance: Normal appearance. She is well-developed. She is not ill-appearing, toxic-appearing or diaphoretic.  HENT:     Head: Normocephalic and atraumatic.  Eyes:     General:        Right eye: No discharge.        Left eye: No discharge.     Conjunctiva/sclera: Conjunctivae normal.  Neck:     Trachea: No tracheal deviation.  Cardiovascular:     Rate and Rhythm: Normal rate.  Pulmonary:     Effort: Pulmonary effort is normal. No respiratory distress.     Breath sounds: No stridor.  Skin:     Coloration: Skin is not jaundiced or pale.     Findings: No rash.  Neurological:     Mental Status: She is alert.  Psychiatric:        Attention and Perception: Attention normal.        Mood and Affect: Mood normal.        Speech: Speech normal.        Behavior: Behavior normal. Behavior is cooperative.        Thought Content: Thought content does not include suicidal ideation. Thought content does not include suicidal plan.        Cognition and Memory: Cognition normal.        Judgment: Judgment normal.     PE limited by telephone encounter    Fall Risk: Fall Risk  04/13/2020 02/22/2020  Falls in the past year? 0 0  Number falls in past yr: 0 0  Injury with Fall? 0 0     Assessment and Plan:     ICD-10-CM   1. Moderate episode of recurrent major depressive disorder (HCC)  F33.1    some persistent sx, no SI, mostly energy/fatigue an issue, she does feel mood is much better with zoloft dose increase to 100 mg   2. Anxiety disorder, unspecified type  F41.9    zoloft 100 and buspar 5-10 mg TID PRN is helping significantly with sx, continue same dosing    Patient is having Xanax prescribed by her OB/GYN who she will continue to see for her physicals and Pap smears.  She has been using this very rarely which we discussed previously.  I did encourage her to let me know if he changes his mind or stops prescribing.  I also encouraged her to reach out to me if she feels that she is getting depressed again and feels like she needs to increase the Zoloft dose.  She previously  was on 200 mg daily and decreased it when she became pregnant last.  For over the last year she has been on 50 mg and recently felt she was getting more depressed and when establishing here asked for dose increase which was very helpful patient is very positive and pleasant.  Her PHQ-9 screening was reviewed today it is still positive but slightly improved from last visit.  Her anxiety screening was reviewed today and was  significantly improved.  She is not sure if it was the Zoloft or the BuSpar the combination of both which have helped but she does feel more like herself.  We will do a 81-month follow-up just to check in.  She does have history of obesity, hyperlipidemia, she has had some gestational hypertension & cardiac symptoms in the past -she does want to do her annual physical with GYN and they monitor some of her dx, but there are things which would be appropriate to address since she has established care here.  I discussed the assessment and treatment plan with the patient. The patient was provided an opportunity to ask questions and all were answered. The patient agreed with the plan and demonstrated an understanding of the instructions.  The patient was advised to call back or seek an in-person evaluation if the symptoms worsen or if the condition fails to improve as anticipated.  I provided 20+ minutes of non-face-to-face time during this encounter.  Delsa Grana, PA-C 04/13/20 1:49 PM

## 2020-06-24 ENCOUNTER — Other Ambulatory Visit: Payer: Self-pay | Admitting: Obstetrics and Gynecology

## 2020-06-24 DIAGNOSIS — Z30011 Encounter for initial prescription of contraceptive pills: Secondary | ICD-10-CM

## 2020-06-26 NOTE — Telephone Encounter (Signed)
Advise if refill is appropriate 

## 2020-10-12 ENCOUNTER — Ambulatory Visit: Payer: Managed Care, Other (non HMO) | Admitting: Family Medicine

## 2021-01-09 ENCOUNTER — Other Ambulatory Visit: Payer: Self-pay | Admitting: Family Medicine

## 2021-01-09 DIAGNOSIS — F419 Anxiety disorder, unspecified: Secondary | ICD-10-CM

## 2021-01-09 DIAGNOSIS — F331 Major depressive disorder, recurrent, moderate: Secondary | ICD-10-CM

## 2021-01-10 NOTE — Telephone Encounter (Signed)
appt made

## 2021-01-10 NOTE — Telephone Encounter (Signed)
Per last appt - virtual f/up in April 2021: Continue your medications as previously prescribed:             Zoloft 100 mg daily             BuSpar 5 to 10 mg up to 3 times a day as needed for anxiety stress or agitation  Please contact me if you feel like you need a dose change.  You are welcome to use schedule follow-up appointment with [place] in 6 months for checking on anxiety and depression/meds etc. I know you were going to continue to do your Pap smear with Dr. Jean Rosenthal but you are also welcome to come here for your annual physical  Front office will call you to make a follow-up appointment   Pt need OV f/up for more refills -  She may choose to get from OBGYN? She is scheduled to see Dr. Jean Rosenthal next month. 90 d supply sent in - would want pt to be seen here or have OBGYN take it over in the next 3 months - front office to call pt to see what she'd like to do

## 2021-01-12 ENCOUNTER — Other Ambulatory Visit: Payer: Self-pay

## 2021-01-12 ENCOUNTER — Emergency Department: Payer: Managed Care, Other (non HMO)

## 2021-01-12 ENCOUNTER — Emergency Department
Admission: EM | Admit: 2021-01-12 | Discharge: 2021-01-12 | Disposition: A | Discharge status: Home / Self Care | Payer: Managed Care, Other (non HMO) | Attending: Emergency Medicine | Admitting: Emergency Medicine

## 2021-01-12 DIAGNOSIS — R079 Chest pain, unspecified: Secondary | ICD-10-CM | POA: Diagnosis not present

## 2021-01-12 DIAGNOSIS — Z5321 Procedure and treatment not carried out due to patient leaving prior to being seen by health care provider: Secondary | ICD-10-CM | POA: Diagnosis not present

## 2021-01-12 DIAGNOSIS — R059 Cough, unspecified: Secondary | ICD-10-CM | POA: Diagnosis not present

## 2021-01-12 DIAGNOSIS — R509 Fever, unspecified: Secondary | ICD-10-CM | POA: Diagnosis not present

## 2021-01-12 LAB — BASIC METABOLIC PANEL
Anion gap: 12 (ref 5–15)
BUN: 8 mg/dL (ref 6–20)
CO2: 21 mmol/L — ABNORMAL LOW (ref 22–32)
Calcium: 9.1 mg/dL (ref 8.9–10.3)
Chloride: 107 mmol/L (ref 98–111)
Creatinine, Ser: 0.63 mg/dL (ref 0.44–1.00)
GFR, Estimated: >60 mL/min (ref >60)
Glucose, Bld: 108 mg/dL — ABNORMAL HIGH (ref 70–99)
Potassium: 3.4 mmol/L — ABNORMAL LOW (ref 3.5–5.1)
Sodium: 140 mmol/L (ref 135–145)

## 2021-01-12 LAB — CBC
HCT: 42 % (ref 36.0–46.0)
Hemoglobin: 14.1 g/dL (ref 12.0–15.0)
MCH: 29 pg (ref 26.0–34.0)
MCHC: 33.6 g/dL (ref 30.0–36.0)
MCV: 86.4 fL (ref 80.0–100.0)
Platelets: 197 10*3/uL (ref 150–400)
RBC: 4.86 MIL/uL (ref 3.87–5.11)
RDW: 13.2 % (ref 11.5–15.5)
WBC: 6 10*3/uL (ref 4.0–10.5)
nRBC: 0 % (ref 0.0–0.2)

## 2021-01-12 LAB — TROPONIN I (HIGH SENSITIVITY): Troponin I (High Sensitivity): <2 ng/L (ref <18)

## 2021-01-12 NOTE — ED Triage Notes (Signed)
Pt states she feels shob, has chest pain and has had a fever with cough. Pt with strong cough noted in triage. Pt tested for covid yesterday but is awaiting results.

## 2021-01-12 NOTE — ED Notes (Signed)
Pt approached front desk reports she feels better and wants to leave RN encouraged to stay pt reports she feels better

## 2021-01-12 NOTE — ED Notes (Signed)
NEGATIVE URINE PREGNANCY TEST

## 2021-02-22 ENCOUNTER — Ambulatory Visit (INDEPENDENT_AMBULATORY_CARE_PROVIDER_SITE_OTHER): Payer: Managed Care, Other (non HMO) | Admitting: Obstetrics and Gynecology

## 2021-02-22 ENCOUNTER — Encounter: Payer: Self-pay | Admitting: Obstetrics and Gynecology

## 2021-02-22 ENCOUNTER — Other Ambulatory Visit: Payer: Self-pay

## 2021-02-22 VITALS — BP 118/78 | Ht 68.0 in | Wt 261.0 lb

## 2021-02-22 DIAGNOSIS — Z1339 Encounter for screening examination for other mental health and behavioral disorders: Secondary | ICD-10-CM

## 2021-02-22 DIAGNOSIS — Z01419 Encounter for gynecological examination (general) (routine) without abnormal findings: Secondary | ICD-10-CM

## 2021-02-22 DIAGNOSIS — Z3041 Encounter for surveillance of contraceptive pills: Secondary | ICD-10-CM

## 2021-02-22 DIAGNOSIS — Z124 Encounter for screening for malignant neoplasm of cervix: Secondary | ICD-10-CM

## 2021-02-22 DIAGNOSIS — F32A Depression, unspecified: Secondary | ICD-10-CM

## 2021-02-22 DIAGNOSIS — Z1331 Encounter for screening for depression: Secondary | ICD-10-CM | POA: Diagnosis not present

## 2021-02-22 DIAGNOSIS — F419 Anxiety disorder, unspecified: Secondary | ICD-10-CM | POA: Diagnosis not present

## 2021-02-22 DIAGNOSIS — Z30011 Encounter for initial prescription of contraceptive pills: Secondary | ICD-10-CM

## 2021-02-22 MED ORDER — ALPRAZOLAM 0.5 MG PO TABS: 0.5000 mg | ORAL_TABLET | Freq: Two times a day (BID) | ORAL | 0 refills | Status: DC | PRN

## 2021-02-22 MED ORDER — NORGESTIMATE-ETH ESTRADIOL 0.25-35 MG-MCG PO TABS: 1.0000 | ORAL_TABLET | Freq: Every day | ORAL | 4 refills | Status: DC

## 2021-02-22 NOTE — Progress Notes (Signed)
Gynecology Annual Exam  PCP: Danelle Berryapia, Leisa, PA-C  Chief Complaint  Patient presents with  . Annual Exam   History of Present Illness:  [name] is a 30 y.o. G2P1102 who LMP was No LMP recorded., presents today for her annual examination.  Her menses are regular every 28-30 days, lasting 4 day(s).  Dysmenorrhea none. She does not have intermenstrual bleeding.  She is sexually active, no dyspareunia.  Last Pap: 02/21/20  Results were: no abnormalities /neg HPV DNA negative  Hx of STDs: none  no FH of breast cancer. There is a FH of cervical cancer.  Her MGF had colon cancer.  Her MGM had pancreatic cancer at age 32. The patient does not do self-breast exams.  Tobacco use: 1/4-1/2 ppd Alcohol use: social drinker Exercise: sometimes  The patient wears seatbelts: yes.   The patient reports that domestic violence in her life is absent.   She continues to require Xanax. She has used approximately 50 pills in the past year.  PMP indicates no other prescribers of this medication.  She states that she does benefit greatly from this medication.   Past Medical History:  Diagnosis Date  . Anxiety   . Complication of anesthesia    BP DROPPED WITH C/S BUT NOT GENERAL  . Depression   . Elevated blood pressure affecting pregnancy in third trimester, antepartum 12/13/2018  . Fetal macrosomia 12/28/2018  . Hyperlipidemia 02/23/2016  . Obesity   . Obesity in pregnancy 05/12/2018   BMI >=40 [X]  early 1h gtt - 93 [X]  bASA (>12 weeks) [ ]  Growth u/s 28 [X] , 32 [ ] , 36 weeks [ ]   - 28 weeks growth 10/13/18 1483g (3lbs 4oz) c/w 78.4%ile with AC 97.4% [ ]  NST/AFI weekly 36+ weeks (36[] , 37[] , 38[] , 39[] , 40[] ) [ ]  IOL by 41 weeks (scheduled, prn [] )   . PROM (premature rupture of membranes) 12/27/2018  . Supervision of high risk pregnancy, antepartum 05/06/2018   Clinic [facility] Prenatal Labs Dating 6wk1d ultrasound Blood type: A/Positive/-- (05/08 1452)  Genetic Screen NIPS: Normal XX,  Inheritest negative Antibody:Negative (05/08 1452) Anatomic US Incomplete posterior fossa views [X]  24 week follow up suboptimal 4 chamber [X]  complete at 28 week growth Rubella: 1.36 (05/08 1452) Varicella: immune GTT Early:93 Third trimester: 114 RPR: Non Reactive (05/08     Past Surgical History:  Procedure Laterality Date  . ANKLE SURGERY    . BREAST BIOPSY  2003  . CESAREAN SECTION N/A 12/28/2018   Procedure: CESAREAN SECTION;  Surgeon: Conard NovakJackson, Markita Stcharles D, MD;  Location: [facility] ORS;  Service: Obstetrics;  Laterality: N/A;  . SEPTOPLASTY N/A 08/17/2019   Procedure: SEPTOPLASTY;  Surgeon: Bud FaceVaught, Creighton, MD;  Location: [facility] ORS;  Service: ENT;  Laterality: N/A;  . TONSILLECTOMY    . TURBINATE REDUCTION Bilateral 08/17/2019   Procedure: TURBINATE REDUCTION;  Surgeon: Bud FaceVaught, Creighton, MD;  Location: [facility] ORS;  Service: ENT;  Laterality: Bilateral;    Prior to Admission medications   Medication Sig Start Date End Date Taking? Authorizing Provider  ALPRAZolam Prudy Feeler(XANAX) 0.5 MG tablet Take 1 tablet (0.5 mg total) by mouth 2 (two) times daily as needed for anxiety. 02/21/20  Yes Conard NovakJackson, Han Lysne D, MD  sertraline (ZOLOFT) 100 MG tablet TAKE 1 TABLET BY MOUTH  DAILY 01/10/21  Yes Danelle Berryapia, Leisa, PA-C  SPRINTEC 28 0.25-35 MG-MCG tablet TAKE 1 TABLET BY MOUTH EVERY DAY 06/27/20  Yes Conard NovakJackson, Kapil Petropoulos D, MD  WEGOVY 0.5 MG/0.5ML SOAJ Inject 0.5 mg into the skin once a  week. 01/26/21   [provider]    Allergies  Allergen Reactions  . Montelukast Palpitations    anxiety   Obstetric History: [id number]  Social History   Socioeconomic History  . Marital status: Married    Spouse name: Terry Zhang  . Number of children: 2  . Years of education: 19  . Highest education level: Some college, no degree  Occupational History  . Not on file  Tobacco Use  . Smoking status: Current Every Day Smoker    Packs/day: 0.25    Types: Cigarettes  . Smokeless tobacco: Never Used  Vaping Use  . Vaping  Use: Never used  Substance and Sexual Activity  . Alcohol use: No    Alcohol/week: 0.0 standard drinks  . Drug use: No  . Sexual activity: Yes    Birth control/protection: Pill  Other Topics Concern  . Not on file  Social History Narrative   Married.   1 daughter.   Works as a Writer at [employer].   Once enjoyed playing sports, going to [employer], swimming.   Social Determinants of Health   Financial Resource Strain: Not on file  Food Insecurity: Not on file  Transportation Needs: Not on file  Physical Activity: Not on file  Stress: Not on file  Social Connections: Not on file  Intimate Partner Violence: Not on file    Family History  Problem Relation Age of Onset  . Arthritis Mother   . Cancer Mother 83       cervical  . Hyperlipidemia Mother   . Mental illness Mother   . Diabetes Mother   . Hyperlipidemia Father   . Hypertension Father   . Mental illness Father   . Diabetes Father        pre  . Mental illness Maternal Grandfather   . Diabetes Maternal Grandfather   . Pancreatic cancer Maternal Grandmother 73    Review of Systems  Constitutional: Positive for malaise/fatigue. Negative for chills, diaphoresis, fever and weight loss.  HENT: Negative.   Eyes: Negative.   Respiratory: Negative.   Cardiovascular: Negative.   Gastrointestinal: Negative.   Genitourinary: Negative.   Musculoskeletal: Negative.   Skin: Negative.   Neurological: Negative.   Endo/Heme/Allergies: Positive for environmental allergies. Negative for polydipsia. Does not bruise/bleed easily.  Psychiatric/Behavioral: Positive for depression. Negative for hallucinations, memory loss, substance abuse and suicidal ideas. The patient is nervous/anxious. The patient does not have insomnia.      Physical Exam BP 118/78   Ht 5\' 8"  (1.727 m)   Wt 261 lb (118.4 kg)   BMI 39.68 kg/m    Physical Exam Constitutional:      General: She is not in acute distress.    Appearance: Normal  appearance. She is well-developed.  Genitourinary:     Vulva and bladder normal.     Right Labia: No rash, tenderness, lesions, skin changes or Bartholin's cyst.    Left Labia: No tenderness, lesions, skin changes, Bartholin's cyst or rash.    No inguinal adenopathy present in the right or left side.    Pelvic Tanner Score: 5/5.    No vaginal discharge, erythema, tenderness or bleeding.      Right Adnexa: not tender, not full and no mass present.    Left Adnexa: not tender, not full and no mass present.    No cervical motion tenderness, discharge, lesion or polyp.     Uterus is not enlarged or tender.     No uterine mass  detected.    Pelvic exam was performed with patient in the lithotomy position.  Breasts:     Right: No inverted nipple, mass, nipple discharge, skin change or tenderness.     Left: No inverted nipple, mass, nipple discharge, skin change or tenderness.    HENT:     Head: Normocephalic and atraumatic.  Eyes:     General: No scleral icterus.    Conjunctiva/sclera: Conjunctivae normal.  Neck:     Thyroid: No thyromegaly.  Cardiovascular:     Rate and Rhythm: Normal rate and regular rhythm.     Heart sounds: No murmur heard. No friction rub. No gallop.   Pulmonary:     Effort: Pulmonary effort is normal. No respiratory distress.     Breath sounds: Normal breath sounds. No wheezing or rales.  Abdominal:     General: Bowel sounds are normal. There is no distension.     Palpations: Abdomen is soft. There is no mass.     Tenderness: There is no abdominal tenderness. There is no guarding or rebound.     Hernia: There is no hernia in the left inguinal area or right inguinal area.  Musculoskeletal:        General: No swelling or tenderness. Normal range of motion.     Cervical back: Normal range of motion and neck supple.  Lymphadenopathy:     Cervical: No cervical adenopathy.     Lower Body: No right inguinal adenopathy. No left inguinal adenopathy.  Neurological:      General: No focal deficit present.     Mental Status: She is alert and oriented to person, place, and time.     Cranial Nerves: No cranial nerve deficit.  Skin:    General: Skin is warm and dry.     Findings: No erythema or rash.  Psychiatric:        Mood and Affect: Mood normal.        Behavior: Behavior normal.        Judgment: Judgment normal.     Female chaperone present for pelvic and breast  portions of the physical exam  Results: AUDIT Questionnaire (screen for alcoholism): 2 PHQ-9: 1  Assessment: 30 y.o. G41P1102 female here for routine annual gynecologic examination  Plan: Problem List Items Addressed This Visit   None   Visit Diagnoses    Women's annual routine gynecological examination    -  Primary   Relevant Medications   ALPRAZolam (XANAX) 0.5 MG tablet   norgestimate-ethinyl estradiol (SPRINTEC 28) 0.25-35 MG-MCG tablet   Other Relevant Orders   IGP, Aptima HPV, rfx 16/18,45   Screening for depression       Screening for alcoholism       Anxiety       Relevant Medications   ALPRAZolam (XANAX) 0.5 MG tablet   Pap smear for cervical cancer screening       Relevant Orders   IGP, Aptima HPV, rfx 16/18,45   Anxiety and depression       Relevant Medications   ALPRAZolam (XANAX) 0.5 MG tablet   Encounter for surveillance of contraceptive pills       Relevant Medications   norgestimate-ethinyl estradiol (SPRINTEC 28) 0.25-35 MG-MCG tablet   Postpartum care and examination       Encounter for initial prescription of contraceptive pills          Screening: -- Blood pressure screen normal -- Weight screening: obese: discussed management options, including lifestyle, dietary, and exercise. -- Depression screening  negative (PHQ-9) -- Nutrition: normal -- cholesterol screening: not due for screening -- osteoporosis screening: not due -- tobacco screening: using: discussed quitting using the 5 A's -- alcohol screening: AUDIT questionnaire indicates  low-risk usage. -- family history of breast cancer screening: done. not at high risk. -- no evidence of domestic violence or intimate partner violence. -- STD screening: gonorrhea/chlamydia NAAT not collected per patient request. -- pap smear collected per ASCCP guidelines -- flu vaccine did not receive -- HPV vaccination series: received -- s/p COVID19 vaccine  Thomasene Mohair, MD 02/22/2021 9:13 AM

## 2021-02-26 LAB — IGP, APTIMA HPV, RFX 16/18,45: HPV Aptima: NEGATIVE

## 2021-03-21 ENCOUNTER — Ambulatory Visit: Payer: Managed Care, Other (non HMO) | Admitting: Family Medicine

## 2021-04-04 ENCOUNTER — Other Ambulatory Visit: Payer: Self-pay | Admitting: Family Medicine

## 2021-04-04 DIAGNOSIS — F419 Anxiety disorder, unspecified: Secondary | ICD-10-CM

## 2021-04-04 DIAGNOSIS — F331 Major depressive disorder, recurrent, moderate: Secondary | ICD-10-CM

## 2021-05-01 ENCOUNTER — Encounter: Payer: Self-pay | Admitting: Family Medicine

## 2021-05-01 ENCOUNTER — Other Ambulatory Visit: Payer: Self-pay

## 2021-05-01 ENCOUNTER — Ambulatory Visit: Payer: Managed Care, Other (non HMO) | Admitting: Family Medicine

## 2021-05-01 VITALS — BP 124/76 | HR 92 | Temp 98.9°F | Resp 18 | Ht 68.0 in | Wt 255.6 lb

## 2021-05-01 DIAGNOSIS — E782 Mixed hyperlipidemia: Secondary | ICD-10-CM

## 2021-05-01 DIAGNOSIS — J209 Acute bronchitis, unspecified: Secondary | ICD-10-CM

## 2021-05-01 DIAGNOSIS — F419 Anxiety disorder, unspecified: Secondary | ICD-10-CM

## 2021-05-01 DIAGNOSIS — I479 Paroxysmal tachycardia, unspecified: Secondary | ICD-10-CM | POA: Diagnosis not present

## 2021-05-01 DIAGNOSIS — Z5181 Encounter for therapeutic drug level monitoring: Secondary | ICD-10-CM

## 2021-05-01 DIAGNOSIS — E559 Vitamin D deficiency, unspecified: Secondary | ICD-10-CM

## 2021-05-01 DIAGNOSIS — F331 Major depressive disorder, recurrent, moderate: Secondary | ICD-10-CM

## 2021-05-01 DIAGNOSIS — E669 Obesity, unspecified: Secondary | ICD-10-CM

## 2021-05-01 DIAGNOSIS — Z6838 Body mass index (BMI) 38.0-38.9, adult: Secondary | ICD-10-CM

## 2021-05-01 MED ORDER — PREDNISONE 20 MG PO TABS: ORAL_TABLET | ORAL | 0 refills | Status: DC

## 2021-05-01 MED ORDER — SERTRALINE HCL 100 MG PO TABS: 1.0000 | ORAL_TABLET | Freq: Every day | ORAL | 3 refills | Status: DC

## 2021-05-01 MED ORDER — ALBUTEROL SULFATE HFA 108 (90 BASE) MCG/ACT IN AERS: 2.0000 | INHALATION_SPRAY | RESPIRATORY_TRACT | 1 refills | Status: DC | PRN

## 2021-05-01 MED ORDER — BUSPIRONE HCL 5 MG PO TABS: 5.0000 mg | ORAL_TABLET | Freq: Three times a day (TID) | ORAL | 3 refills | Status: DC | PRN

## 2021-05-01 NOTE — Progress Notes (Signed)
Name: Terry Zhang   MRN: [id number]    DOB: November 13, 1991   Date:05/01/2021       Progress Note  Chief Complaint  Patient presents with   Depression    6 month follow up     Subjective:   Terry Zhang is a 30 y.o. female, presents to clinic for routine f/up, last appt over a year ago  Depression screen [facility] 2/9 05/01/2021 04/13/2020 02/22/2020  Decreased Interest 1 1 1   Down, Depressed, Hopeless 1 1 0  PHQ - 2 Score 2 2 1   Altered sleeping 0 0 1  Tired, decreased energy 2 3 3   Change in appetite 0 0 0  Feeling bad or failure about yourself  0 0 0  Trouble concentrating 1 0 1  Moving slowly or fidgety/restless 0 0 0  Suicidal thoughts 0 0 0  PHQ-9 Score 5 5 6   Difficult doing work/chores Not difficult at all Somewhat difficult Somewhat difficult   GAD 7 : Generalized Anxiety Score 05/01/2021 04/13/2020 02/22/2020 08/12/2019  Nervous, Anxious, on Edge 1 0 1 2  Control/stop worrying 1 0 0 2  Worry too much - different things 1 0 3 1  Trouble relaxing 1 1 1 2   Restless 1 0 0 1  Easily annoyed or irritable 1 0 1 1  Afraid - awful might happen 0 0 0 0  Total GAD 7 Score 6 1 6 9   Anxiety Difficulty Not difficult at all Not difficult at all Not difficult at all -   Zoloft 100 mg still working well, some recent stressors and more panic attacks with some stuff going preteen and baby going into daycare and very sick over and over  Hyperlipidemia: Not currently on meds, wanted to work on diet/lifestyle Last Lipids: Lab Results  Component Value Date   CHOL 228 (H) 03/01/2020   HDL 48 03/01/2020   LDLCALC 146 (H) 03/01/2020   TRIG 188 (H) 03/01/2020   CHOLHDL 4.8 (H) 03/01/2020   - Denies: Chest pain, shortness of breath, myalgias, claudication  On sprintec - per GYN  Vit D deficiency:  Not on any supple OTC right now  Last vitamin D Lab Results  Component Value Date   VD25OH 20.2 (L) 03/01/2020   Was doing weight loss meds - weight down Wt Readings from Last 5  Encounters:  05/01/21 255 lb 9.6 oz (115.9 kg)  02/22/21 261 lb (118.4 kg)  04/13/20 278 lb (126.1 kg)  02/22/20 280 lb 3.2 oz (127.1 kg)  02/21/20 279 lb (126.6 kg)   BMI Readings from Last 5 Encounters:  05/01/21 38.86 kg/m  02/22/21 39.68 kg/m  04/13/20 42.27 kg/m  02/22/20 42.60 kg/m  02/21/20 42.42 kg/m   Off meds right now - bariatric specialists - stopped Wegovy shot due to SE when she increased dose to 1 mg dose weekly      Current Outpatient Medications:    ALPRAZolam (XANAX) 0.5 MG tablet, Take 1 tablet (0.5 mg total) by mouth 2 (two) times daily as needed for anxiety., Disp: 30 tablet, Rfl: 0   busPIRone (BUSPAR) 5 MG tablet, Take 1-2 tablets (5-10 mg total) by mouth every 8 (eight) hours as needed (for anxiety/agitation/stress sx)., Disp: 90 tablet, Rfl: 1   norgestimate-ethinyl estradiol (SPRINTEC 28) 0.25-35 MG-MCG tablet, Take 1 tablet by mouth daily., Disp: 84 tablet, Rfl: 4   sertraline (ZOLOFT) 100 MG tablet, TAKE 1 TABLET BY MOUTH  DAILY, Disp: 90 tablet, Rfl: 0   phentermine (ADIPEX-P)  37.5 MG tablet, Take 18.75 mg by mouth 2 (two) times daily. (Patient not taking: No sig reported), Disp: , Rfl:    Vitamin D, Ergocalciferol, (DRISDOL) 1.25 MG (50000 UNIT) CAPS capsule, Take 1 capsule (50,000 Units total) by mouth every 7 (seven) days. x12 weeks. (Patient not taking: No sig reported), Disp: 12 capsule, Rfl: 0   WEGOVY 0.5 MG/0.5ML SOAJ, Inject 0.5 mg into the skin once a week. (Patient not taking: Reported on 05/01/2021), Disp: , Rfl:   Patient Active Problem List   Diagnosis Date Noted   Moderate episode of recurrent major depressive disorder (HCC) 04/13/2020   Class 3 severe obesity with body mass index (BMI) of 40.0 to 44.9 in adult [facility](HCC) 04/13/2020   Vasovagal episode 12/22/2018   Gestational hypertension 12/11/2018   Paroxysmal tachycardia (HCC) 07/10/2018   History of preterm delivery, currently pregnant 05/12/2018   Hyperlipidemia 02/23/2016   Anxiety  disorder 08/03/2015    Past Surgical History:  Procedure Laterality Date   ANKLE SURGERY     BREAST BIOPSY  2003   CESAREAN SECTION N/A 12/28/2018   Procedure: CESAREAN SECTION;  Surgeon: Conard NovakJackson, Stephen D, MD;  Location: [facility] ORS;  Service: Obstetrics;  Laterality: N/A;   SEPTOPLASTY N/A 08/17/2019   Procedure: SEPTOPLASTY;  Surgeon: Bud FaceVaught, Creighton, MD;  Location: [facility] ORS;  Service: ENT;  Laterality: N/A;   TONSILLECTOMY     TURBINATE REDUCTION Bilateral 08/17/2019   Procedure: TURBINATE REDUCTION;  Surgeon: Bud FaceVaught, Creighton, MD;  Location: [facility] ORS;  Service: ENT;  Laterality: Bilateral;    Family History  Problem Relation Age of Onset   Arthritis Mother    Cancer Mother [id number]       cervical   Hyperlipidemia Mother    Mental illness Mother    Diabetes Mother    Hyperlipidemia Father    Hypertension Father    Mental illness Father    Diabetes Father        pre   Mental illness Maternal Grandfather    Diabetes Maternal Grandfather    Pancreatic cancer Maternal Grandmother [id number]    Social History   Tobacco Use   Smoking status: Current Every Day Smoker    Packs/day: 0.25    Types: Cigarettes   Smokeless tobacco: Never Used  Vaping Use   Vaping Use: Never used  Substance Use Topics   Alcohol use: No    Alcohol/week: 0.0 standard drinks   Drug use: No     Allergies  Allergen Reactions   Montelukast Palpitations    anxiety    Health Maintenance  Topic Date Due   Hepatitis C Screening  Never done   COVID-19 Vaccine (1) Never done   INFLUENZA VACCINE  07/30/2021   PAP SMEAR-Modifier  02/23/2024   TETANUS/TDAP  10/27/2028   HIV Screening  Completed   HPV VACCINES  Aged Out    Chart Review Today: I personally reviewed active problem list, medication list, allergies, family history, social history, health maintenance, notes from last encounter, lab results, imaging with the patient/caregiver today.   Review of Systems  Constitutional: Negative.   HENT:  Negative.    Eyes: Negative.   Respiratory: Negative.    Cardiovascular: Negative.   Gastrointestinal: Negative.   Endocrine: Negative.   Genitourinary: Negative.   Musculoskeletal: Negative.   Skin: Negative.   Allergic/Immunologic: Negative.   Neurological: Negative.   Hematological: Negative.   Psychiatric/Behavioral: Negative.    All other systems reviewed and are negative.   Objective:   Vitals:  05/01/21 0921  BP: 124/76  Pulse: 92  Resp: 18  Temp: 98.9 F (37.2 C)  TempSrc: Oral  SpO2: 98%  Weight: 255 lb 9.6 oz (115.9 kg)  Height: 5\' 8"  (1.727 m)    Body mass index is 38.86 kg/m.  Physical Exam Vitals and nursing note reviewed.  Constitutional:      General: She is not in acute distress.    Appearance: Normal appearance. She is well-developed. She is obese. She is not ill-appearing, toxic-appearing or diaphoretic.     Interventions: Face mask in place.  HENT:     Head: Normocephalic and atraumatic.     Right Ear: External ear normal.     Left Ear: External ear normal.     Nose: Congestion present.     Mouth/Throat:     Mouth: Mucous membranes are moist.     Pharynx: Oropharynx is clear. No oropharyngeal exudate or posterior oropharyngeal erythema.  Eyes:     General: Lids are normal. No scleral icterus.       Right eye: No discharge.        Left eye: No discharge.     Conjunctiva/sclera: Conjunctivae normal.  Neck:     Trachea: Phonation normal. No tracheal deviation.  Cardiovascular:     Rate and Rhythm: Normal rate and regular rhythm.     Pulses: Normal pulses.          Radial pulses are 2+ on the right side and 2+ on the left side.       Posterior tibial pulses are 2+ on the right side and 2+ on the left side.     Heart sounds: Normal heart sounds. No murmur heard.   No friction rub. No gallop.  Pulmonary:     Effort: Pulmonary effort is normal. No respiratory distress.     Breath sounds: No stridor. Wheezing and rhonchi present. No rales.   Chest:     Chest wall: No tenderness.  Abdominal:     General: Bowel sounds are normal. There is no distension.     Palpations: Abdomen is soft.  Musculoskeletal:     Right lower leg: No edema.     Left lower leg: No edema.  Skin:    General: Skin is warm and dry.     Capillary Refill: Capillary refill takes less than 2 seconds.     Coloration: Skin is not jaundiced or pale.     Findings: No rash.  Neurological:     Mental Status: She is alert.     Motor: No abnormal muscle tone.     Gait: Gait normal.  Psychiatric:        Mood and Affect: Mood normal.        Speech: Speech normal.        Behavior: Behavior normal.        Assessment & Plan:     ICD-10-CM   1. Moderate episode of recurrent major depressive disorder (HCC)  F33.1 busPIRone (BUSPAR) 5 MG tablet    DISCONTINUED: sertraline (ZOLOFT) 100 MG tablet   increase dose of zoloft    2. Anxiety disorder, unspecified type  F41.9 busPIRone (BUSPAR) 5 MG tablet    DISCONTINUED: sertraline (ZOLOFT) 100 MG tablet   increase zoloft dose slowly, add buspar, discussed coping mechanisms    3. Paroxysmal tachycardia (HCC) Chronic I47.9    HR in normal range today, no recent palpitations    4. Mixed hyperlipidemia  E78.2 Lipid panel    Comprehensive metabolic  panel   discussed diet/lifestyle efforts and indication for statin    5. Vitamin D deficiency  E55.9 VITAMIN D 25 Hydroxy (Vit-D Deficiency, Fractures)    Comprehensive metabolic panel    6. Class 2 obesity with body mass index (BMI) of 38.0 to 38.9 in adult, unspecified obesity type, unspecified whether serious comorbidity present  E66.9    Z68.38    per specialists    7. Encounter for medication monitoring  Z51.81 CBC with Differential/Platelet    Lipid panel    VITAMIN D 25 Hydroxy (Vit-D Deficiency, Fractures)    Comprehensive metabolic panel    8. Acute bronchitis, unspecified organism  J20.9 albuterol (VENTOLIN HFA) 108 (90 Base) MCG/ACT inhaler     predniSONE (DELTASONE) 20 MG tablet    DG Chest 2 View   pt with wheeze on exam, tx for acute bronchitis with steroid burst and with albuterol inhaler, f/up if not improving       No follow-ups on file.   Danelle Berry, PA-C 05/01/21 9:40 AM

## 2021-05-03 LAB — COMPREHENSIVE METABOLIC PANEL
ALT: 13 IU/L (ref 0–32)
AST: 9 IU/L (ref 0–40)
Albumin/Globulin Ratio: 1.4 (ref 1.2–2.2)
Albumin: 4.1 g/dL (ref 3.8–4.8)
Alkaline Phosphatase: 41 IU/L — ABNORMAL LOW (ref 44–121)
BUN/Creatinine Ratio: 13 (ref 9–23)
BUN: 8 mg/dL (ref 6–20)
Bilirubin Total: <0.2 mg/dL (ref 0.0–1.2)
CO2: 19 mmol/L — ABNORMAL LOW (ref 20–29)
Calcium: 9.9 mg/dL (ref 8.7–10.2)
Chloride: 104 mmol/L (ref 96–106)
Creatinine, Ser: 0.6 mg/dL (ref 0.57–1.00)
Globulin, Total: 2.9 g/dL (ref 1.5–4.5)
Glucose: 99 mg/dL (ref 65–99)
Potassium: 4.4 mmol/L (ref 3.5–5.2)
Sodium: 140 mmol/L (ref 134–144)
Total Protein: 7 g/dL (ref 6.0–8.5)
eGFR: 122 mL/min/{1.73_m2} (ref >59)

## 2021-05-03 LAB — CBC WITH DIFFERENTIAL/PLATELET
Basophils Absolute: 0 10*3/uL (ref 0.0–0.2)
Basos: 0 %
EOS (ABSOLUTE): 0 10*3/uL (ref 0.0–0.4)
Eos: 0 %
Hematocrit: 41.7 % (ref 34.0–46.6)
Hemoglobin: 14.2 g/dL (ref 11.1–15.9)
Immature Grans (Abs): 0 10*3/uL (ref 0.0–0.1)
Immature Granulocytes: 0 %
Lymphocytes Absolute: 2.8 10*3/uL (ref 0.7–3.1)
Lymphs: 32 %
MCH: 29.8 pg (ref 26.6–33.0)
MCHC: 34.1 g/dL (ref 31.5–35.7)
MCV: 88 fL (ref 79–97)
Monocytes Absolute: 0.5 10*3/uL (ref 0.1–0.9)
Monocytes: 6 %
Neutrophils Absolute: 5.4 10*3/uL (ref 1.4–7.0)
Neutrophils: 62 %
Platelets: 295 10*3/uL (ref 150–450)
RBC: 4.76 x10E6/uL (ref 3.77–5.28)
RDW: 13 % (ref 11.7–15.4)
WBC: 8.8 10*3/uL (ref 3.4–10.8)

## 2021-05-03 LAB — LIPID PANEL
Chol/HDL Ratio: 4.5 ratio — ABNORMAL HIGH (ref 0.0–4.4)
Cholesterol, Total: 267 mg/dL — ABNORMAL HIGH (ref 100–199)
HDL: 59 mg/dL (ref >39)
LDL Chol Calc (NIH): 167 mg/dL — ABNORMAL HIGH (ref 0–99)
Triglycerides: 221 mg/dL — ABNORMAL HIGH (ref 0–149)
VLDL Cholesterol Cal: 41 mg/dL — ABNORMAL HIGH (ref 5–40)

## 2021-05-03 LAB — VITAMIN D 25 HYDROXY (VIT D DEFICIENCY, FRACTURES): Vit D, 25-Hydroxy: 20 ng/mL — ABNORMAL LOW (ref 30.0–100.0)

## 2021-06-08 ENCOUNTER — Encounter: Payer: Self-pay | Admitting: Family Medicine

## 2021-06-08 ENCOUNTER — Other Ambulatory Visit: Payer: Self-pay

## 2021-06-08 DIAGNOSIS — F331 Major depressive disorder, recurrent, moderate: Secondary | ICD-10-CM

## 2021-06-08 DIAGNOSIS — F419 Anxiety disorder, unspecified: Secondary | ICD-10-CM

## 2021-06-08 MED ORDER — SERTRALINE HCL 100 MG PO TABS: 1.0000 | ORAL_TABLET | Freq: Every day | ORAL | 3 refills | Status: DC

## 2021-06-14 ENCOUNTER — Encounter: Payer: Self-pay | Admitting: Family Medicine

## 2021-07-22 ENCOUNTER — Other Ambulatory Visit: Payer: Self-pay | Admitting: Obstetrics and Gynecology

## 2021-07-22 DIAGNOSIS — Z3041 Encounter for surveillance of contraceptive pills: Secondary | ICD-10-CM

## 2021-07-22 DIAGNOSIS — Z01419 Encounter for gynecological examination (general) (routine) without abnormal findings: Secondary | ICD-10-CM

## 2021-10-04 ENCOUNTER — Other Ambulatory Visit: Payer: Self-pay | Admitting: Family Medicine

## 2021-10-04 DIAGNOSIS — J209 Acute bronchitis, unspecified: Secondary | ICD-10-CM

## 2021-10-04 NOTE — Telephone Encounter (Signed)
Requested Prescriptions  Pending Prescriptions Disp Refills  . albuterol (VENTOLIN HFA) 108 (90 Base) MCG/ACT inhaler [Pharmacy Med Name: ALBUTEROL HFA (PROAIR) INHALER] 8.5 each 1    Sig: INHALE 2 PUFFS INTO THE LUNGS EVERY 4 HOURS AS NEEDED FOR WHEEZING OR SHORTNESS OF BREATH.     Pulmonology:  Beta Agonists Failed - 10/04/2021  1:40 AM      Failed - One inhaler should last at least one month. If the patient is requesting refills earlier, contact the patient to check for uncontrolled symptoms.      Passed - Valid encounter within last 12 months    Recent Outpatient Visits          5 months ago Moderate episode of recurrent major depressive disorder [facility])   [facility] [place], Sheliah Mends, PA-C   1 year ago Moderate episode of recurrent major depressive disorder [facility])   [facility] Danelle Berry, PA-C   1 year ago Mixed hyperlipidemia   [facility] Danelle Berry, [place]

## 2022-02-16 IMAGING — CR DG CHEST 2V
2 series · 2 of 2 positions shown · non-contrast
Comparison: None.

CLINICAL DATA: Chest pain

EXAM:
CHEST - 2 VIEW

[chest pa]
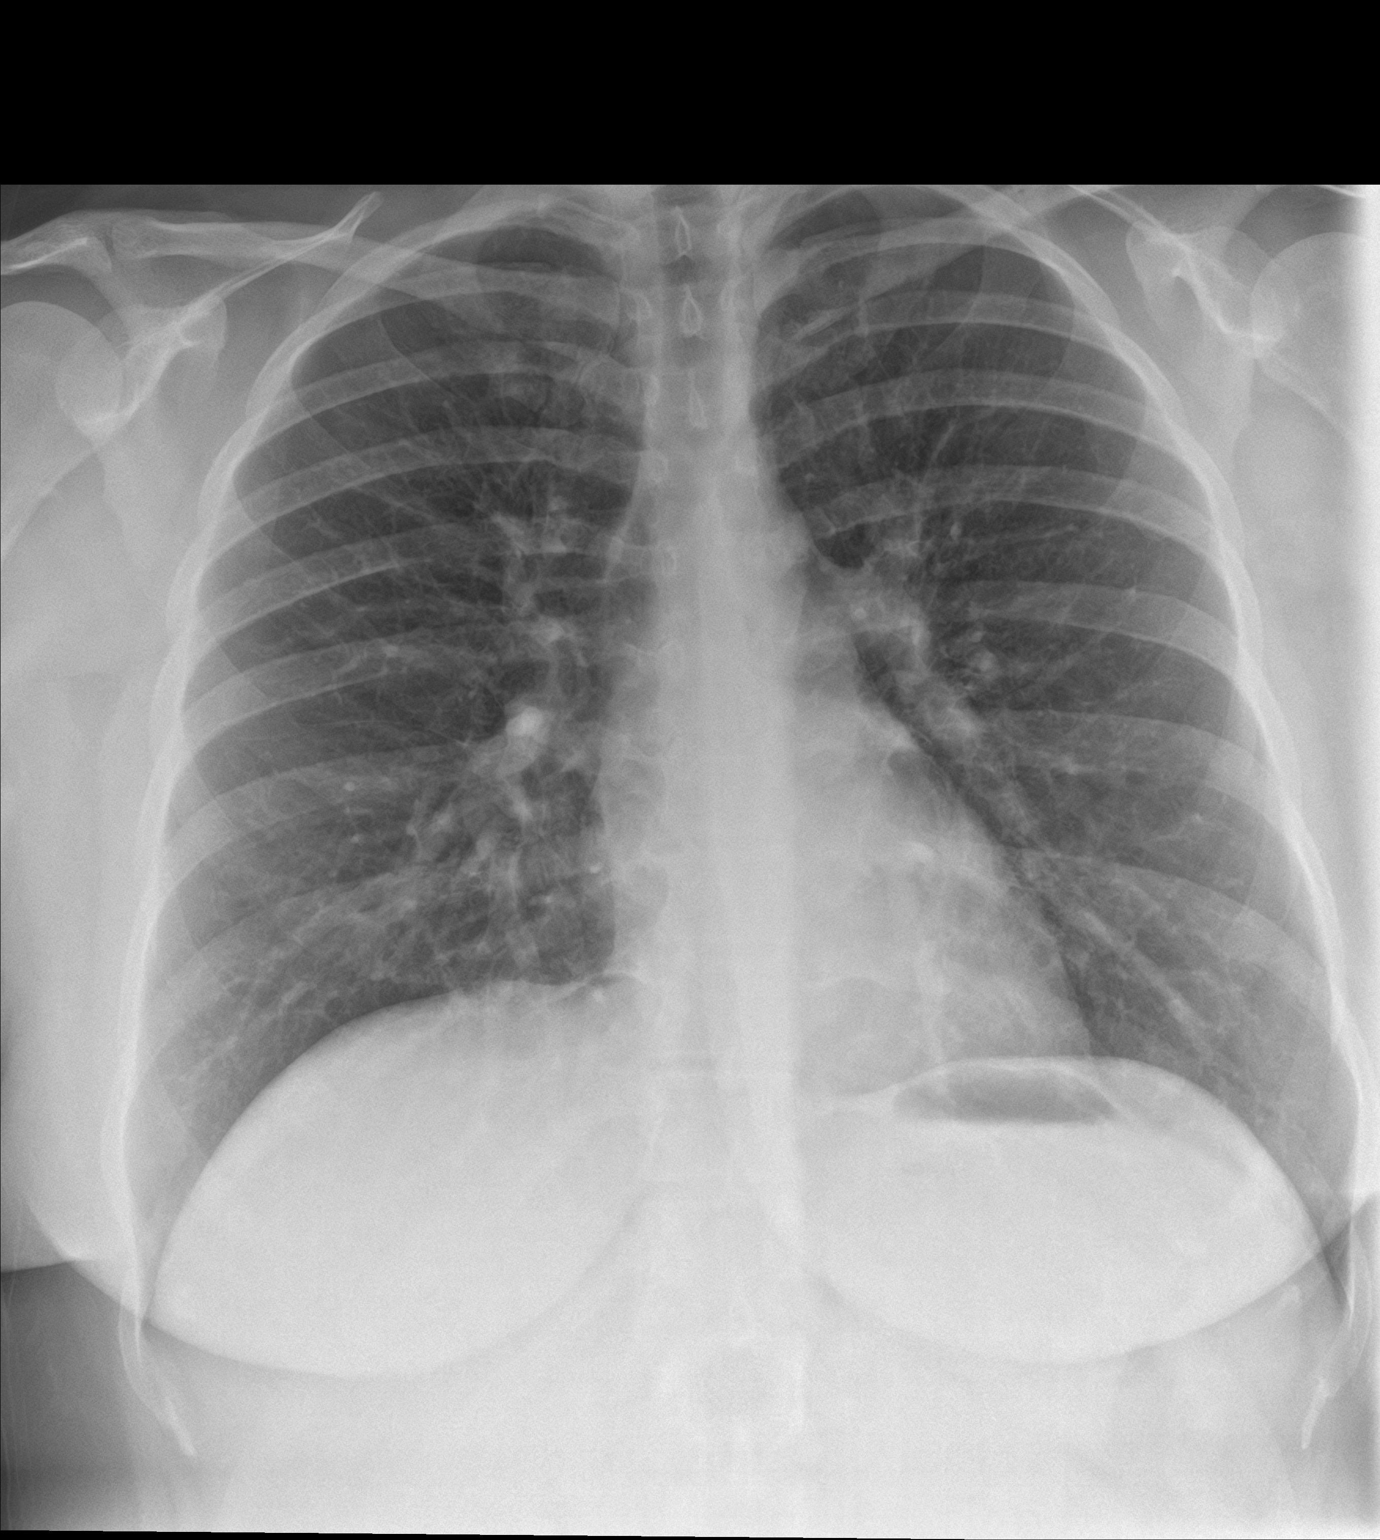

[chest lat]
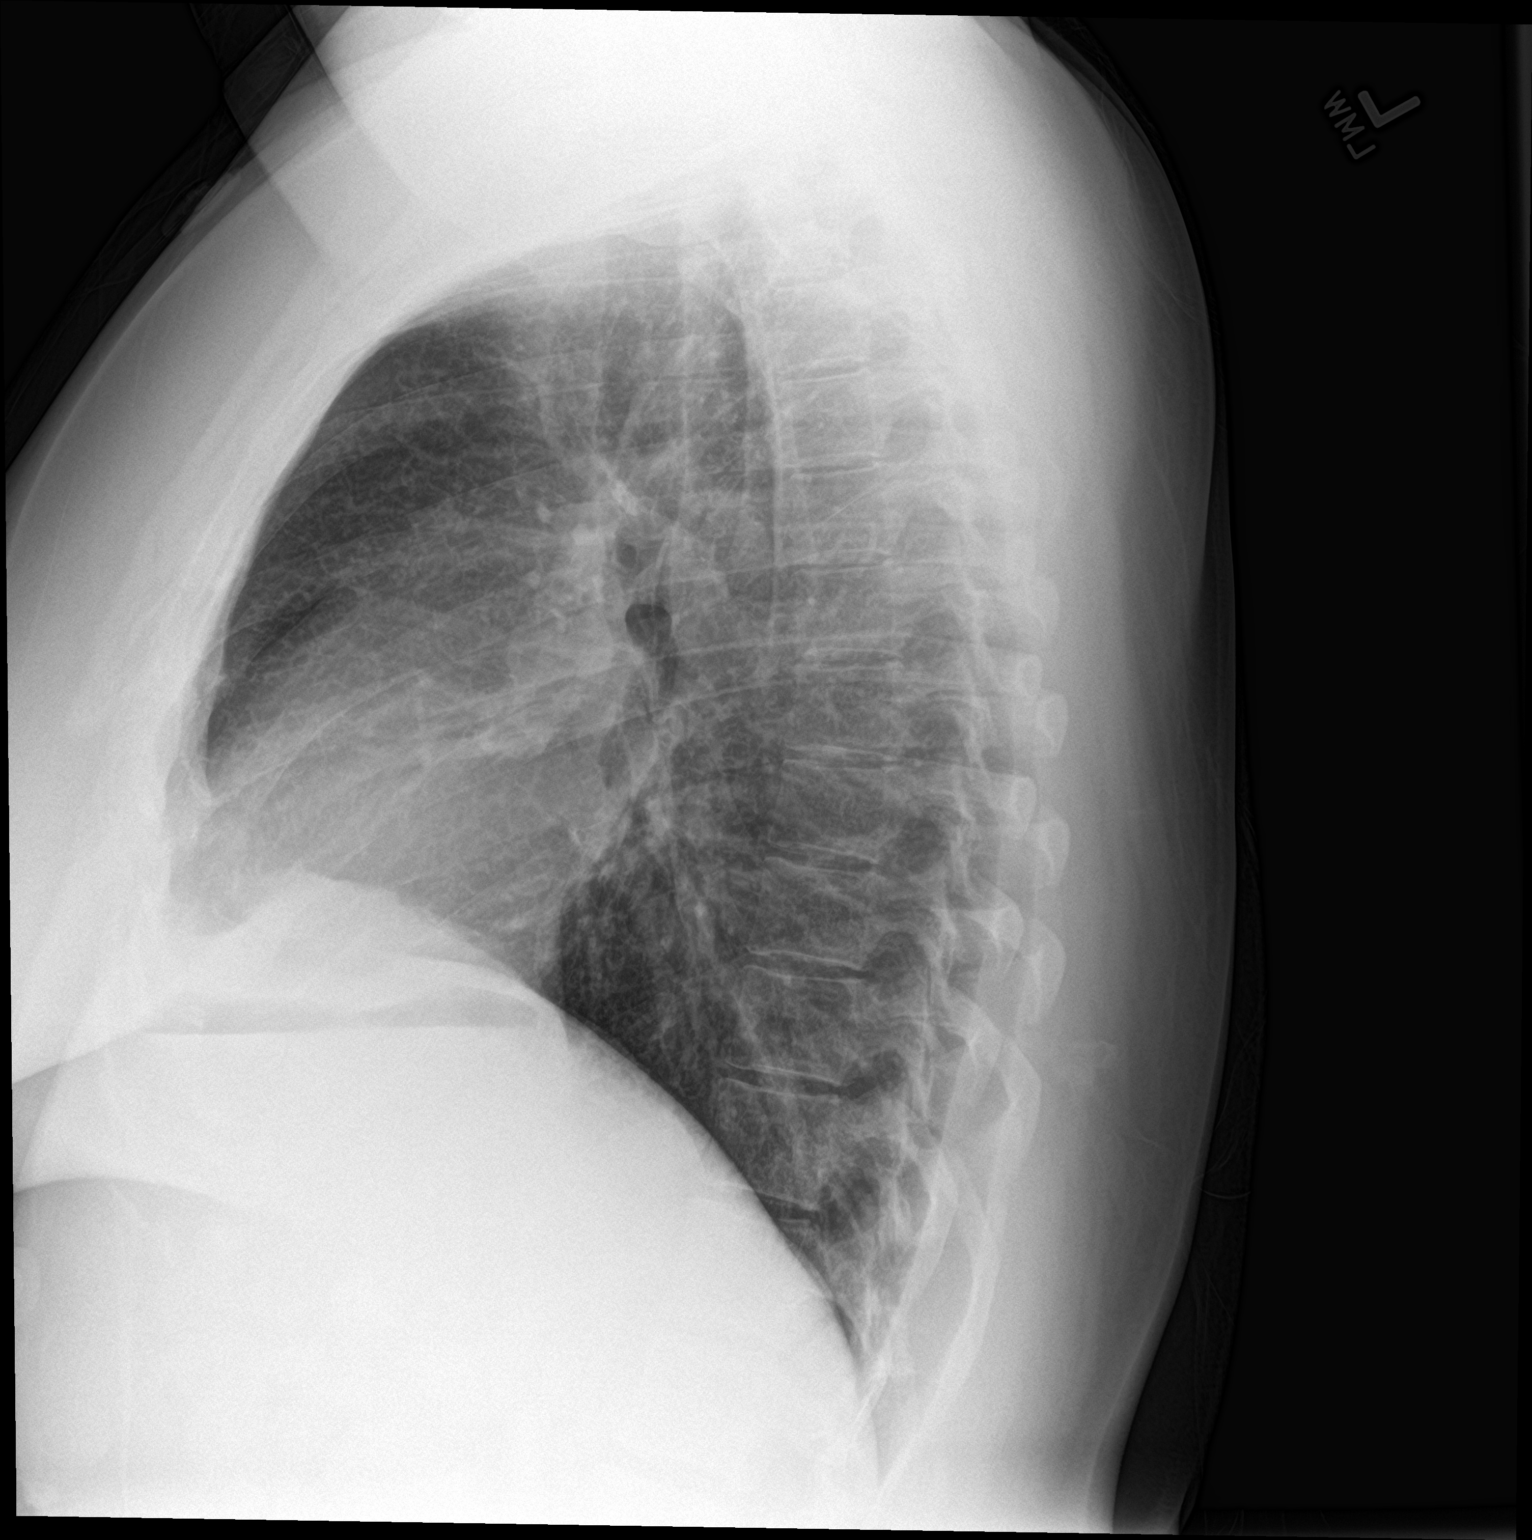

[2 of 2 positions shown; findings below may reference images not displayed]

FINDINGS: The heart size and mediastinal contours are within normal limits.
Both lungs are clear. The visualized skeletal structures are
unremarkable.
IMPRESSION: No active cardiopulmonary disease.

## 2022-02-19 ENCOUNTER — Other Ambulatory Visit: Payer: Self-pay

## 2022-03-07 ENCOUNTER — Encounter: Payer: Self-pay | Admitting: Nurse Practitioner

## 2022-03-07 ENCOUNTER — Other Ambulatory Visit: Payer: Self-pay

## 2022-03-07 ENCOUNTER — Ambulatory Visit: Payer: Managed Care, Other (non HMO) | Admitting: Nurse Practitioner

## 2022-03-07 VITALS — BP 124/72 | HR 98 | Temp 98.2°F | Resp 16 | Ht 68.0 in | Wt 287.1 lb

## 2022-03-07 DIAGNOSIS — Z3041 Encounter for surveillance of contraceptive pills: Secondary | ICD-10-CM

## 2022-03-07 DIAGNOSIS — F419 Anxiety disorder, unspecified: Secondary | ICD-10-CM

## 2022-03-07 DIAGNOSIS — E782 Mixed hyperlipidemia: Secondary | ICD-10-CM

## 2022-03-07 DIAGNOSIS — E559 Vitamin D deficiency, unspecified: Secondary | ICD-10-CM

## 2022-03-07 DIAGNOSIS — F331 Major depressive disorder, recurrent, moderate: Secondary | ICD-10-CM

## 2022-03-07 MED ORDER — NORGESTIMATE-ETH ESTRADIOL 0.25-35 MG-MCG PO TABS: 1.0000 | ORAL_TABLET | Freq: Every day | ORAL | 4 refills | Status: DC

## 2022-03-07 MED ORDER — BUSPIRONE HCL 5 MG PO TABS
5.0000 mg | ORAL_TABLET | Freq: Three times a day (TID) | ORAL | 3 refills | Status: DC | PRN
Start: 2022-03-07 — End: 2023-05-16

## 2022-03-07 MED ORDER — ALPRAZOLAM 0.5 MG PO TABS: 0.5000 mg | ORAL_TABLET | Freq: Two times a day (BID) | ORAL | 1 refills | Status: DC | PRN

## 2022-03-07 MED ORDER — SERTRALINE HCL 100 MG PO TABS: 100.0000 mg | ORAL_TABLET | Freq: Every day | ORAL | 3 refills | Status: DC

## 2022-03-07 NOTE — Progress Notes (Signed)
? ?BP 124/72   Pulse 98   Temp 98.2 ?F (36.8 ?C) (Oral)   Resp 16   Ht _0  (1.727 m)   Wt 287 lb 1.6 oz (130.2 kg)   SpO2 98%   BMI 43.65 kg/m?   ? ?Subjective:  ? ? Patient ID: Terry Zhang, female    DOB: 1991-12-05, 31 y.o.   MRN: [id number] ? ?HPI: ?Terry Zhang is a 31 y.o. female, here with mom ? ?Chief Complaint  ?Patient presents with  ? Depression  ? ?Depression/anxiety: She says that she has been feeling good on her dose of zoloft.  She says that her mood is pretty good and she would like to continue her current dose.  She denies any suicidal thoughts. She takes the xanax as needed for anxiety.  She says the prescription typically lasts her the whole year.  ?Depression screen [facility] 2/9 03/07/2022 05/01/2021 04/13/2020 02/22/2020 02/21/2020  ?Decreased Interest _1 0  ?Down, Depressed, Hopeless _2 0 0  ?PHQ - 2 Score _3 0  ?Altered sleeping 1 0 0 1 1  ?Tired, decreased energy _4 ?Change in appetite 0 0 0 0 0  ?Feeling bad or failure about yourself  0 0 0 0 0  ?Trouble concentrating 1 1 0 1 0  ?Moving slowly or fidgety/restless 0 0 0 0 0  ?Suicidal thoughts 0 0 0 0 0  ?PHQ-9 Score _5 ?Difficult doing work/chores Somewhat difficult Not difficult at all Somewhat difficult Somewhat difficult Somewhat difficult  ?  ?GAD 7 : Generalized Anxiety Score 03/07/2022 05/01/2021 04/13/2020 02/22/2020  ?Nervous, Anxious, on Edge 2 1 0 1  ?Control/stop worrying 1 1 0 0  ?Worry too much - different things 1 1 0 3  ?Trouble relaxing _6 ?Restless 1 1 0 0  ?Easily annoyed or irritable 1 1 0 1  ?Afraid - awful might happen 0 0 0 0  ?Total GAD 7 Score _7 ?Anxiety Difficulty Somewhat difficult Not difficult at all Not difficult at all Not difficult at all  ? ?Hyperlipidemia: Her last LDL was 167 on 05/02/2021.  She is not currently on any treatment.  She is working on lifestyle modification.  She says she would like to wait and get labs at her physical in April.  ? ?Vitamin D  deficiency: She says she is not taking supplement because it really upset her stomach.  Her last vitamin D level was 20 on 05/02/2021.  ? ?Relevant past medical, surgical, family and social history reviewed and updated as indicated. Interim medical history since our last visit reviewed. ?Allergies and medications reviewed and updated. ? ?Review of Systems ? ?Constitutional: Negative for fever or weight change.  ?Respiratory: Negative for cough and shortness of breath.   ?Cardiovascular: Negative for chest pain or palpitations.  ?Gastrointestinal: Negative for abdominal pain, no bowel changes.  ?Musculoskeletal: Negative for gait problem or joint swelling.  ?Skin: Negative for rash.  ?Neurological: Negative for dizziness or headache.  ?No other specific complaints in a complete review of systems (except as listed in HPI above).  ? ?   ?Objective:  ?  ?BP 124/72   Pulse 98   Temp 98.2 ?F (36.8 ?C) (Oral)   Resp 16   Ht _8  (1.727 m)   Wt 287 lb 1.6 oz (130.2 kg)   SpO2 98%   BMI  43.65 kg/m?   ?Wt Readings from Last 3 Encounters:  ?03/07/22 287 lb 1.6 oz (130.2 kg)  ?05/01/21 255 lb 9.6 oz (115.9 kg)  ?02/22/21 261 lb (118.4 kg)  ?  ?Physical Exam ? ?Constitutional: Patient appears well-developed and well-nourished. Obese  No distress.  ?HEENT: head atraumatic, normocephalic, pupils equal and reactive to light, neck supple, throat within normal limits ?Cardiovascular: Normal rate, regular rhythm and normal heart sounds.  No murmur heard. No BLE edema. ?Pulmonary/Chest: Effort normal and breath sounds normal. No respiratory distress. ?Abdominal: Soft.  There is no tenderness. ?Psychiatric: Patient has a normal mood and affect. behavior is normal. Judgment and thought content normal.  ? ?Results for orders placed or performed in visit on 05/01/21  ?CBC with Differential/Platelet  ?Result Value Ref Range  ? WBC 8.8 3.4 - 10.8 x10E3/uL  ? RBC 4.76 3.77 - 5.28 x10E6/uL  ? Hemoglobin 14.2 11.1 - 15.9 g/dL  ?  Hematocrit 41.7 34.0 - 46.6 %  ? MCV 88 79 - 97 fL  ? MCH 29.8 26.6 - 33.0 pg  ? MCHC 34.1 31.5 - 35.7 g/dL  ? RDW 13.0 11.7 - 15.4 %  ? Platelets 295 150 - 450 x10E3/uL  ? Neutrophils 62 Not Estab. %  ? Lymphs 32 Not Estab. %  ? Monocytes 6 Not Estab. %  ? Eos 0 Not Estab. %  ? Basos 0 Not Estab. %  ? Neutrophils Absolute 5.4 1.4 - 7.0 x10E3/uL  ? Lymphocytes Absolute 2.8 0.7 - 3.1 x10E3/uL  ? Monocytes Absolute 0.5 0.1 - 0.9 x10E3/uL  ? EOS (ABSOLUTE) 0.0 0.0 - 0.4 x10E3/uL  ? Basophils Absolute 0.0 0.0 - 0.2 x10E3/uL  ? Immature Granulocytes 0 Not Estab. %  ? Immature Grans (Abs) 0.0 0.0 - 0.1 x10E3/uL  ?Lipid panel  ?Result Value Ref Range  ? Cholesterol, Total 267 (H) 100 - 199 mg/dL  ? Triglycerides 221 (H) 0 - 149 mg/dL  ? HDL 59 >39 mg/dL  ? VLDL Cholesterol Cal 41 (H) 5 - 40 mg/dL  ? LDL Chol Calc (NIH) 167 (H) 0 - 99 mg/dL  ? Chol/HDL Ratio 4.5 (H) 0.0 - 4.4 ratio  ?VITAMIN D 25 Hydroxy (Vit-D Deficiency, Fractures)  ?Result Value Ref Range  ? Vit D, 25-Hydroxy 20.0 (L) 30.0 - 100.0 ng/mL  ?Comprehensive metabolic panel  ?Result Value Ref Range  ? Glucose 99 65 - 99 mg/dL  ? BUN 8 6 - 20 mg/dL  ? Creatinine, Ser 0.60 0.57 - 1.00 mg/dL  ? eGFR 122 >59 mL/min/1.73  ? BUN/Creatinine Ratio 13 9 - 23  ? Sodium 140 134 - 144 mmol/L  ? Potassium 4.4 3.5 - 5.2 mmol/L  ? Chloride 104 96 - 106 mmol/L  ? CO2 19 (L) 20 - 29 mmol/L  ? Calcium 9.9 8.7 - 10.2 mg/dL  ? Total Protein 7.0 6.0 - 8.5 g/dL  ? Albumin 4.1 3.8 - 4.8 g/dL  ? Globulin, Total 2.9 1.5 - 4.5 g/dL  ? Albumin/Globulin Ratio 1.4 1.2 - 2.2  ? Bilirubin Total <0.2 0.0 - 1.2 mg/dL  ? Alkaline Phosphatase 41 (L) 44 - 121 IU/L  ? AST 9 0 - 40 IU/L  ? ALT 13 0 - 32 IU/L  ? ?   ?Assessment & Plan:  ? ?1. Moderate episode of recurrent major depressive disorder ([place]) ?-continue current treatment plan ?- busPIRone (BUSPAR) 5 MG tablet; Take 1-2 tablets (5-10 mg total) by mouth every 8 (eight) hours as needed (for anxiety/agitation/stress sx).  Dispense: 90  tablet; Refill: 3 ?- sertraline (ZOLOFT) 100 MG tablet; Take 1 tablet (100 mg total) by mouth daily.  Dispense: 90 tablet; Refill: 3 ? ?2. Anxiety disorder, unspecified type ?-continue current treatment plan ?- ALPRAZolam (XANAX) 0.5 MG tablet; Take 1 tablet (0.5 mg total) by mouth 2 (two) times daily as needed for anxiety.  Dispense: 30 tablet; Refill: 1 ?- busPIRone (BUSPAR) 5 MG tablet; Take 1-2 tablets (5-10 mg total) by mouth every 8 (eight) hours as needed (for anxiety/agitation/stress sx).  Dispense: 90 tablet; Refill: 3 ?- sertraline (ZOLOFT) 100 MG tablet; Take 1 tablet (100 mg total) by mouth daily.  Dispense: 90 tablet; Refill: 3 ? ?3. Mixed hyperlipidemia ?-continue working on lifestyle modification ?-get labs at physical in April ? ?4. Vitamin D deficiency ?-unable to tolerate vitamin D supplement ? ?5. Encounter for surveillance of contraceptive pills ?-her GYN left the practice and she is wanting to switch her women's healthcare to her primary ?- norgestimate-ethinyl estradiol (Prichard 28) 0.25-35 MG-MCG tablet; Take 1 tablet by mouth daily.  Dispense: 84 tablet; Refill: 4  ? ?Follow up plan: ?Has appointment for physical in April ? ? ? ? ? ?

## 2022-04-01 ENCOUNTER — Telehealth: Payer: Self-pay

## 2022-04-01 NOTE — Telephone Encounter (Signed)
Copied from CRM [id number]. Topic: General - Other >> Apr 01, 2022 10:09 AM Fanny Bien wrote: Reason for CRM: Pt would like to know if she can transfer care to [facility].

## 2022-04-01 NOTE — Telephone Encounter (Signed)
Lvm informing pt the Raynelle Fanning is willing to take her on as her patient. The provider name has been switched

## 2022-04-01 NOTE — Telephone Encounter (Signed)
Is julie willing to take this patient as hers

## 2022-04-04 ENCOUNTER — Encounter: Payer: Managed Care, Other (non HMO) | Admitting: Family Medicine

## 2022-08-21 LAB — LIPID PANEL
Cholesterol: 260 — AB (ref 0–200)
HDL: 60 (ref 35–70)
LDL Cholesterol: 164
Triglycerides: 196 — AB (ref 40–160)

## 2022-08-21 LAB — HEMOGLOBIN A1C: Hemoglobin A1C: 5.9

## 2022-11-12 ENCOUNTER — Encounter: Payer: Managed Care, Other (non HMO) | Admitting: Nurse Practitioner

## 2022-11-15 ENCOUNTER — Encounter: Payer: Self-pay | Admitting: Nurse Practitioner

## 2022-11-15 ENCOUNTER — Ambulatory Visit (INDEPENDENT_AMBULATORY_CARE_PROVIDER_SITE_OTHER): Payer: Managed Care, Other (non HMO) | Admitting: Nurse Practitioner

## 2022-11-15 ENCOUNTER — Other Ambulatory Visit: Payer: Self-pay

## 2022-11-15 VITALS — BP 122/76 | HR 88 | Temp 98.1°F | Resp 16 | Ht 68.0 in | Wt 297.0 lb

## 2022-11-15 DIAGNOSIS — R55 Syncope and collapse: Secondary | ICD-10-CM

## 2022-11-15 DIAGNOSIS — Z0000 Encounter for general adult medical examination without abnormal findings: Secondary | ICD-10-CM

## 2022-11-15 DIAGNOSIS — Z1159 Encounter for screening for other viral diseases: Secondary | ICD-10-CM | POA: Diagnosis not present

## 2022-11-15 DIAGNOSIS — J324 Chronic pansinusitis: Secondary | ICD-10-CM

## 2022-11-15 DIAGNOSIS — Z130 Encounter for screening for diseases of the blood and blood-forming organs and certain disorders involving the immune mechanism: Secondary | ICD-10-CM

## 2022-11-15 DIAGNOSIS — Z6841 Body Mass Index (BMI) 40.0 and over, adult: Secondary | ICD-10-CM

## 2022-11-15 DIAGNOSIS — E559 Vitamin D deficiency, unspecified: Secondary | ICD-10-CM | POA: Diagnosis not present

## 2022-11-15 DIAGNOSIS — Z131 Encounter for screening for diabetes mellitus: Secondary | ICD-10-CM

## 2022-11-15 DIAGNOSIS — E782 Mixed hyperlipidemia: Secondary | ICD-10-CM | POA: Diagnosis not present

## 2022-11-15 MED ORDER — PREDNISONE 10 MG (21) PO TBPK: ORAL_TABLET | ORAL | 0 refills | Status: DC

## 2022-11-15 MED ORDER — SIMVASTATIN 20 MG PO TABS: 20.0000 mg | ORAL_TABLET | Freq: Every day | ORAL | 3 refills | Status: DC

## 2022-11-15 NOTE — Assessment & Plan Note (Signed)
Getting labs 

## 2022-11-15 NOTE — Assessment & Plan Note (Signed)
Referral placed to weight management.  Recommend working on well balanced diet with portion control and increasing physical activity as tolerated.

## 2022-11-15 NOTE — Assessment & Plan Note (Signed)
Restart simvastatin 20 mg daily

## 2022-11-15 NOTE — Assessment & Plan Note (Signed)
Stop using afrin, use steroid taper and switch to saline nasal spray or flonase

## 2022-11-15 NOTE — Progress Notes (Signed)
Name: Terry Zhang   MRN: [id number]    DOB: 1991/05/25   Date:11/15/2022       Progress Note  Subjective  Chief Complaint  Chief Complaint  Patient presents with   Annual Exam    HPI  Patient presents for annual CPE and acute issues. She is aware of possible additional charge  Hyperlipidemia:  she says she had labs done at work on 08/21/2022. Her LDL was 164, triglycerides 196. She says she used to be on simvastatin and came off it after weight loss.   Prediabetes: Her last A1C was 5.9 ib 08/21/2022.  Obesity: Her currently weight is 297 lbs with a BMI 45.16. [facility]  Will   Sinusitis: She says that she started using afrin again.  She says that she usually sees ENT for this and he gives her a steroid taper to get off afrin.  Discussed other treatment for her sinusitis. WIll send in steroid taper.   Syncopal episodes:  She says she has had syncopal episodes multiple times since she was a child.  She says she can tell when it is going to happened. The last time it happened it was in Aug. Will refer to neurology.    Diet: well balanced diet, she does love carbs and eats late Exercise: she says she is not really getting any exercises other than running with a toddler.    Sleep: 8 hours Last dental exam:10/03/2022 Last eye exam: a couple years ago  [employer] Visit from 11/15/2022 in [facility]  AUDIT-C Score 1      Depression: Phq 9 is  positive, she is currently on zoloft, xanax and buspar.     11/15/2022    1:02 PM 03/07/2022    1:25 PM 05/01/2021    9:23 AM 04/13/2020   12:24 PM 02/22/2020    9:50 AM  Depression screen PHQ 2/9  Decreased Interest _0 Down, Depressed, Hopeless _1 0  PHQ - 2 Score _2 Altered sleeping 0 1 0 0 1  Tired, decreased energy _3 Change in appetite 0 0 0 0 0  Feeling bad or failure about yourself  0 0 0 0 0  Trouble concentrating _4 0 1  Moving slowly  or fidgety/restless 0 0 0 0 0  Suicidal thoughts 0 0 0 0 0  PHQ-9 Score _5 Difficult doing work/chores Somewhat difficult Somewhat difficult Not difficult at all Somewhat difficult Somewhat difficult   Hypertension: BP Readings from Last 3 Encounters:  11/15/22 122/76  03/07/22 124/72  05/01/21 124/76   Obesity: Wt Readings from Last 3 Encounters:  11/15/22 297 lb (134.7 kg)  03/07/22 287 lb 1.6 oz (130.2 kg)  05/01/21 255 lb 9.6 oz (115.9 kg)   BMI Readings from Last 3 Encounters:  11/15/22 45.16 kg/m  03/07/22 43.65 kg/m  05/01/21 38.86 kg/m     Vaccines:  HPV: up to at age 36 , ask insurance if age between 31-45  Shingrix: 8-64 yo and ask insurance if covered when patient above 76 yo Pneumonia:  educated and discussed with patient. Flu:  educated and discussed with patient.  Hep C Screening: ordered STD testing and prevention (HIV/chl/gon/syphilis): 10/13/2018 Intimate partner violence:none Sexual History :yes Menstrual History/LMP/Abnormal Bleeding: [facility]: three weeks ago Incontinence Symptoms: none  Breast cancer:  - Last Mammogram: none, no  concerns - BRCA gene screening: none  Osteoporosis: Discussed high calcium and vitamin D supplementation, weight bearing exercises  Cervical cancer screening: 02/22/2021  Skin cancer: Discussed monitoring for atypical lesions  Colorectal cancer: no concerns, does not qualify   Lung cancer:   Low Dose CT Chest recommended if Age 104-80 years, 20 pack-year currently smoking OR have quit w/in 15years. Patient does not qualify.   ECG: 01/12/2021  Advanced Care Planning: A voluntary discussion about advance care planning including the explanation and discussion of advance directives.  Discussed health care proxy and Living will, and the patient was able to identify a health care proxy as husband.  Patient does not have a living will at present time. If patient does have living will, I have requested they bring this to the  clinic to be scanned in to their chart.  Lipids: Lab Results  Component Value Date   CHOL 267 (H) 05/02/2021   CHOL 228 (H) 03/01/2020   Lab Results  Component Value Date   HDL 59 05/02/2021   HDL 48 03/01/2020   Lab Results  Component Value Date   LDLCALC 167 (H) 05/02/2021   LDLCALC 146 (H) 03/01/2020   Lab Results  Component Value Date   TRIG 221 (H) 05/02/2021   TRIG 188 (H) 03/01/2020   Lab Results  Component Value Date   CHOLHDL 4.5 (H) 05/02/2021   CHOLHDL 4.8 (H) 03/01/2020   No results found for: "LDLDIRECT"  Glucose: Glucose  Date Value Ref Range Status  05/02/2021 99 65 - 99 mg/dL Final  03/01/2020 106 (H) 65 - 99 mg/dL Final  01/23/2012 108 (H) 65 - 99 mg/dL Final   Glucose, Bld  Date Value Ref Range Status  01/12/2021 108 (H) 70 - 99 mg/dL Final    Comment:    Glucose reference range applies only to samples taken after fasting for at least 8 hours.  12/22/2018 115 (H) 70 - 99 mg/dL Final  12/13/2018 95 70 - 99 mg/dL Final    Patient Active Problem List   Diagnosis Date Noted   Vitamin D deficiency 11/15/2022   Chronic pansinusitis 11/15/2022   Moderate episode of recurrent major depressive disorder ([place]) 04/13/2020   Class 3 severe obesity due to excess calories with serious comorbidity and body mass index (BMI) of 45.0 to 49.9 in adult [facility]) 04/13/2020   Hyperlipidemia 02/23/2016   Anxiety disorder 08/03/2015    Past Surgical History:  Procedure Laterality Date   ANKLE SURGERY     BREAST BIOPSY  2003   CESAREAN SECTION N/A 12/28/2018   Procedure: CESAREAN SECTION;  Surgeon: Will Bonnet, MD;  Location: [facility] ORS;  Service: Obstetrics;  Laterality: N/A;   SEPTOPLASTY N/A 08/17/2019   Procedure: SEPTOPLASTY;  Surgeon: Carloyn Manner, MD;  Location: [facility] ORS;  Service: ENT;  Laterality: N/A;   TONSILLECTOMY     TURBINATE REDUCTION Bilateral 08/17/2019   Procedure: TURBINATE REDUCTION;  Surgeon: Carloyn Manner, MD;  Location: [facility]  ORS;  Service: ENT;  Laterality: Bilateral;    Family History  Problem Relation Age of Onset   Arthritis Mother    Cancer Mother 58       cervical   Hyperlipidemia Mother    Mental illness Mother    Diabetes Mother    Hyperlipidemia Father    Hypertension Father    Mental illness Father    Diabetes Father        pre   Mental illness Maternal Grandfather    Diabetes Maternal  Grandfather    Pancreatic cancer Maternal Grandmother 73    Social History   Socioeconomic History   Marital status: Married    Spouse name: [name]   Number of children: 2   Years of education: 12   Highest education level: Some college, no degree  Occupational History   Not on file  Tobacco Use   Smoking status: Every Day    Packs/day: 0.25    Types: Cigarettes   Smokeless tobacco: Never  Vaping Use   Vaping Use: Never used  Substance and Sexual Activity   Alcohol use: No    Alcohol/week: 0.0 standard drinks of alcohol   Drug use: No   Sexual activity: Yes    Birth control/protection: Pill  Other Topics Concern   Not on file  Social History Narrative   Married.   1 daughter.   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   Once enjoyed playing sports, going to [employer], swimming.   Social Determinants of Health   Financial Resource Strain: Low Risk  (11/15/2022)   Overall Financial Resource Strain (CARDIA)    Difficulty of Paying Living Expenses: Not hard at all  Food Insecurity: No Food Insecurity (11/15/2022)   Hunger Vital Sign    Worried About Running Out of Food in the Last Year: Never true    Ran Out of Food in the Last Year: Never true  Transportation Needs: No Transportation Needs (11/15/2022)   PRAPARE - Hydrologist (Medical): No    Lack of Transportation (Non-Medical): No  Physical Activity: Insufficiently Active (11/15/2022)   Exercise Vital Sign    Days of Exercise per Week: 1 day    Minutes of Exercise per Session: 30 min  Stress: No  Stress Concern Present (11/15/2022)   [place]    Feeling of Stress : Only a little  Social Connections: Moderately Isolated (11/15/2022)   Social Connection and Isolation Panel [NHANES]    Frequency of Communication with Friends and Family: More than three times a week    Frequency of Social Gatherings with Friends and Family: More than three times a week    Attends Religious Services: Never    Marine scientist or Organizations: No    Attends Archivist Meetings: Never    Marital Status: Married  Human resources officer Violence: Not At Risk (11/15/2022)   Humiliation, Afraid, Rape, and Kick questionnaire    Fear of Current or Ex-Partner: No    Emotionally Abused: No    Physically Abused: No    Sexually Abused: No     Current Outpatient Medications:    ALPRAZolam (XANAX) 0.5 MG tablet, Take 1 tablet (0.5 mg total) by mouth 2 (two) times daily as needed for anxiety., Disp: 30 tablet, Rfl: 1   busPIRone (BUSPAR) 5 MG tablet, Take 1-2 tablets (5-10 mg total) by mouth every 8 (eight) hours as needed (for anxiety/agitation/stress sx)., Disp: 90 tablet, Rfl: 3   norgestimate-ethinyl estradiol (SPRINTEC 28) 0.25-35 MG-MCG tablet, Take 1 tablet by mouth daily., Disp: 84 tablet, Rfl: 4   predniSONE (STERAPRED UNI-PAK 21 TAB) 10 MG (21) TBPK tablet, Take as directed on package.  (60 mg po on day 1, 50 mg po on day 2...), Disp: 21 tablet, Rfl: 0   sertraline (ZOLOFT) 100 MG tablet, Take 1 tablet (100 mg total) by mouth daily., Disp: 90 tablet, Rfl: 3   simvastatin (ZOCOR) 20 MG tablet, Take 1  tablet (20 mg total) by mouth at bedtime., Disp: 90 tablet, Rfl: 3  Allergies  Allergen Reactions   Montelukast Palpitations    anxiety     ROS  Constitutional: Negative for fever or weight change.  Respiratory: Negative for cough and shortness of breath.   Cardiovascular: Negative for chest pain or palpitations.   Gastrointestinal: Negative for abdominal pain, no bowel changes.  Musculoskeletal: Negative for gait problem or joint swelling.  Skin: Negative for rash.  Neurological: Negative for dizziness or headache.  No other specific complaints in a complete review of systems (except as listed in HPI above).   Objective  Vitals:   11/15/22 1253  BP: 122/76  Pulse: 88  Resp: 16  Temp: 98.1 F (36.7 C)  TempSrc: Oral  SpO2: 95%  Weight: 297 lb (134.7 kg)  Height: _0  (1.727 m)    Body mass index is 45.16 kg/m.  Physical Exam  Constitutional: Patient appears well-developed and well-nourished. No distress.  HENT: Head: Normocephalic and atraumatic. Ears: B TMs ok, no erythema or effusion; Nose: Nose normal. Mouth/Throat: Oropharynx is clear and moist. No oropharyngeal exudate.  Eyes: Conjunctivae and EOM are normal. Pupils are equal, round, and reactive to light. No scleral icterus.  Neck: Normal range of motion. Neck supple. No JVD present. No thyromegaly present.  Cardiovascular: Normal rate, regular rhythm and normal heart sounds.  No murmur heard. No BLE edema. Pulmonary/Chest: Effort normal and breath sounds normal. No respiratory distress. Abdominal: Soft. Bowel sounds are normal, no distension. There is no tenderness. no masses Breast: no lumps or masses, no nipple discharge or rashes Musculoskeletal: Normal range of motion, no joint effusions. No gross deformities Neurological: he is alert and oriented to person, place, and time. No cranial nerve deficit. Coordination, balance, strength, speech and gait are normal.  Skin: Skin is warm and dry. No rash noted. No erythema.  Psychiatric: Patient has a normal mood and affect. behavior is normal. Judgment and thought content normal.   No results found for this or any previous visit (from the past 2160 hour(s)).    Fall Risk:    11/15/2022    1:02 PM 03/07/2022    1:24 PM 05/01/2021    9:23 AM 04/13/2020   12:24 PM 02/22/2020     9:50 AM  Fall Risk   Falls in the past year? 0 0 0 0 0  Number falls in past yr: 0 0 0 0 0  Injury with Fall? 0 0 0 0 0  Follow up Falls evaluation completed Falls evaluation completed Falls evaluation completed       Functional Status Survey: Is the patient deaf or have difficulty hearing?: No Does the patient have difficulty seeing, even when wearing glasses/contacts?: No Does the patient have difficulty concentrating, remembering, or making decisions?: No Does the patient have difficulty walking or climbing stairs?: No Does the patient have difficulty dressing or bathing?: No Does the patient have difficulty doing errands alone such as visiting a doctor's office or shopping?: No   Assessment & Plan  Problem List Items Addressed This Visit       Respiratory   Chronic pansinusitis    Stop using afrin, use steroid taper and switch to saline nasal spray or flonase      Relevant Medications   predniSONE (STERAPRED UNI-PAK 21 TAB) 10 MG (21) TBPK tablet     Other   Hyperlipidemia    Restart simvastatin 20 mg daily      Relevant Medications  simvastatin (ZOCOR) 20 MG tablet   Other Relevant Orders   Lipid panel   Comprehensive metabolic panel   Class 3 severe obesity due to excess calories with serious comorbidity and body mass index (BMI) of 45.0 to 49.9 in adult [facility])    Referral placed to weight management.  Recommend working on well balanced diet with portion control and increasing physical activity as tolerated.       Relevant Orders   Amb Ref to Medical Weight Management   TSH   Vitamin D deficiency    Getting labs      Other Visit Diagnoses     Annual physical exam    -  Primary   Relevant Orders   Lipid panel   CBC with Differential/Platelet   Comprehensive metabolic panel   TSH   Hepatitis C antibody   Encounter for hepatitis C screening test for low risk patient       Relevant Orders   Hepatitis C antibody   Screening for diabetes mellitus        Screening for deficiency anemia       Relevant Orders   CBC with Differential/Platelet   Syncope, unspecified syncope type       referral placed to neurology, not a new problem has been dealing with this since childhood   Relevant Orders   Ambulatory referral to Neurology        -USPSTF grade A and B recommendations reviewed with patient; age-appropriate recommendations, preventive care, screening tests, etc discussed and encouraged; healthy living encouraged; see AVS for patient education given to patient -Discussed importance of 150 minutes of physical activity weekly, eat two servings of fish weekly, eat one serving of tree nuts ( cashews, pistachios, pecans, almonds.Marland Kitchen) every other day, eat 6 servings of fruit/vegetables daily and drink plenty of water and avoid sweet beverages.

## 2022-11-18 ENCOUNTER — Encounter: Payer: Self-pay | Admitting: Nurse Practitioner

## 2022-12-19 ENCOUNTER — Telehealth (INDEPENDENT_AMBULATORY_CARE_PROVIDER_SITE_OTHER): Payer: Managed Care, Other (non HMO) | Admitting: Nurse Practitioner

## 2022-12-19 ENCOUNTER — Encounter: Payer: Self-pay | Admitting: Nurse Practitioner

## 2022-12-19 ENCOUNTER — Other Ambulatory Visit: Payer: Self-pay

## 2022-12-19 DIAGNOSIS — R6883 Chills (without fever): Secondary | ICD-10-CM | POA: Diagnosis not present

## 2022-12-19 DIAGNOSIS — R059 Cough, unspecified: Secondary | ICD-10-CM | POA: Diagnosis not present

## 2022-12-19 DIAGNOSIS — R6889 Other general symptoms and signs: Secondary | ICD-10-CM

## 2022-12-19 MED ORDER — OSELTAMIVIR PHOSPHATE 75 MG PO CAPS: 75.0000 mg | ORAL_CAPSULE | Freq: Two times a day (BID) | ORAL | 0 refills | Status: AC

## 2022-12-19 MED ORDER — BENZONATATE 100 MG PO CAPS: 200.0000 mg | ORAL_CAPSULE | Freq: Two times a day (BID) | ORAL | 0 refills | Status: DC | PRN

## 2022-12-19 NOTE — Progress Notes (Signed)
Name: Terry Zhang   MRN: [id number]    DOB: Jun 25, 1991   Date:12/19/2022       Progress Note  Subjective  Chief Complaint  Chief Complaint  Patient presents with   Fatigue    Nausea, vomiting on Tuesday and Wednesday. Today bodyaches and chills, congestion, cough    I connected with  [name]  on 12/19/22 at  3:20 PM EST by a video enabled telemedicine application and verified that I am speaking with the correct person using two identifiers.  I discussed the limitations of evaluation and management by telemedicine and the availability of in person appointments. The patient expressed understanding and agreed to proceed with a virtual visit  Staff also discussed with the patient that there may be a patient responsible charge related to this service. Patient Location: home Provider Location: [facility] Additional Individuals present: alone  HPI  URI/nausea/vomiting:patient reports on Tuesday and Wednesday she had nausea and vomiting she says today she has started having body aches, chills, congestion and cough. She denies any fever or shortness of breath.  She says she has been taking OTC flu medication.  She says that she feels like she has the flu. Will treat for flu like illness. Discussed tamiflu.  Will send in tamiflu, and tessalon perls.  Recommend taking vitamin c, vitamin d and zinc.   Patient Active Problem List   Diagnosis Date Noted   Vitamin D deficiency 11/15/2022   Chronic pansinusitis 11/15/2022   Moderate episode of recurrent major depressive disorder (H[facility]) 04/13/2020   Class 3 severe obesity due to excess calories with serious comorbidity and body mass index (BMI) of 45.0 to 49.9 in adult [facility]) 04/13/2020   Hyperlipidemia 02/23/2016   Anxiety disorder 08/03/2015    Social History   Tobacco Use   Smoking status: Every Day    Packs/day: 0.25    Types: Cigarettes   Smokeless tobacco: Never  Substance Use Topics   Alcohol use: No    Alcohol/week: 0.0  standard drinks of alcohol     Current Outpatient Medications:    ALPRAZolam (XANAX) 0.5 MG tablet, Take 1 tablet (0.5 mg total) by mouth 2 (two) times daily as needed for anxiety., Disp: 30 tablet, Rfl: 1   benzonatate (TESSALON) 100 MG capsule, Take 2 capsules (200 mg total) by mouth 2 (two) times daily as needed for cough., Disp: 20 capsule, Rfl: 0   busPIRone (BUSPAR) 5 MG tablet, Take 1-2 tablets (5-10 mg total) by mouth every 8 (eight) hours as needed (for anxiety/agitation/stress sx)., Disp: 90 tablet, Rfl: 3   norgestimate-ethinyl estradiol (SPRINTEC 28) 0.25-35 MG-MCG tablet, Take 1 tablet by mouth daily., Disp: 84 tablet, Rfl: 4   oseltamivir (TAMIFLU) 75 MG capsule, Take 1 capsule (75 mg total) by mouth 2 (two) times daily for 5 days., Disp: 10 capsule, Rfl: 0   sertraline (ZOLOFT) 100 MG tablet, Take 1 tablet (100 mg total) by mouth daily., Disp: 90 tablet, Rfl: 3   simvastatin (ZOCOR) 20 MG tablet, Take 1 tablet (20 mg total) by mouth at bedtime., Disp: 90 tablet, Rfl: 3   predniSONE (STERAPRED UNI-PAK 21 TAB) 10 MG (21) TBPK tablet, Take as directed on package.  (60 mg po on day 1, 50 mg po on day 2...) (Patient not taking: Reported on 12/19/2022), Disp: 21 tablet, Rfl: 0  Allergies  Allergen Reactions   Montelukast Palpitations    anxiety    I personally reviewed active problem list, medication list, allergies with the  patient/caregiver today.  ROS  Constitutional: Negative for fever or weight change. Positive for body aches HEENT: positive for nasal congestion Respiratory: positive for cough and negative for shortness of breath.   Cardiovascular: Negative for chest pain or palpitations.  Gastrointestinal: Negative for abdominal pain, no bowel changes.  Musculoskeletal: Negative for gait problem or joint swelling.  Skin: Negative for rash.  Neurological: Negative for dizziness or headache.  No other specific complaints in a complete review of systems (except as listed  in HPI above).   Objective  Virtual encounter, vitals not obtained.  There is no height or weight on file to calculate BMI.  Nursing Note and Vital Signs reviewed.  Physical Exam  Awake, alert and oriented, speaking in complete sentences  No results found for this or any previous visit (from the past 72 hour(s)).  Assessment & Plan  Problem List Items Addressed This Visit   None Visit Diagnoses     Flu-like symptoms    -  Primary   start tamiflu tessalon perls, push fluids, get rest, take vitamin c, d and zinc, can continue to take OTC cold medication   Relevant Medications   oseltamivir (TAMIFLU) 75 MG capsule   benzonatate (TESSALON) 100 MG capsule        -Red flags and when to present for emergency care or RTC including fever >101.11F, chest pain, shortness of breath, new/worsening/un-resolving symptoms,  reviewed with patient at time of visit. Follow up and care instructions discussed and provided in AVS. - I discussed the assessment and treatment plan with the patient. The patient was provided an opportunity to ask questions and all were answered. The patient agreed with the plan and demonstrated an understanding of the instructions.  I provided 15 minutes of non-face-to-face time during this encounter.  Berniece Salines, FNP

## 2023-01-27 ENCOUNTER — Encounter (INDEPENDENT_AMBULATORY_CARE_PROVIDER_SITE_OTHER): Payer: Managed Care, Other (non HMO) | Admitting: Family Medicine

## 2023-01-30 ENCOUNTER — Encounter (INDEPENDENT_AMBULATORY_CARE_PROVIDER_SITE_OTHER): Payer: Managed Care, Other (non HMO) | Admitting: Family Medicine

## 2023-02-14 ENCOUNTER — Other Ambulatory Visit: Payer: Self-pay | Admitting: Nurse Practitioner

## 2023-02-14 DIAGNOSIS — Z3041 Encounter for surveillance of contraceptive pills: Secondary | ICD-10-CM

## 2023-02-17 NOTE — Telephone Encounter (Signed)
Requested Prescriptions  Pending Prescriptions Disp Refills   Sterling 28 0.25-35 MG-MCG tablet [Pharmacy Med Name: Sprintec 28 0.25-35 MG-MCG Oral Tablet] 84 tablet 3    Sig: TAKE 1 TABLET BY MOUTH DAILY     OB/GYN:  Contraceptives Failed - 02/14/2023 10:48 PM      Failed - Patient is not a smoker      Passed - Last BP in normal range    BP Readings from Last 1 Encounters:  11/15/22 122/76         Passed - Valid encounter within last 12 months    Recent Outpatient Visits           2 months ago Flu-like symptoms   [place], FNP   3 months ago Annual physical exam   [facility] Bo Merino, FNP   11 months ago Moderate episode of recurrent major depressive disorder [facility])   [facility] Serafina Royals F, FNP   1 year ago Moderate episode of recurrent major depressive disorder [facility])   [facility] Delsa Grana, PA-C   2 years ago Moderate episode of recurrent major depressive disorder [facility])   [facility] Delsa Grana, [place]

## 2023-02-20 ENCOUNTER — Other Ambulatory Visit: Payer: Self-pay | Admitting: Nurse Practitioner

## 2023-02-20 DIAGNOSIS — F419 Anxiety disorder, unspecified: Secondary | ICD-10-CM

## 2023-02-20 DIAGNOSIS — F331 Major depressive disorder, recurrent, moderate: Secondary | ICD-10-CM

## 2023-02-21 NOTE — Telephone Encounter (Signed)
Requested Prescriptions  Pending Prescriptions Disp Refills   sertraline (ZOLOFT) 100 MG tablet [Pharmacy Med Name: Sertraline HCl 100 MG Oral Tablet] 90 tablet 3    Sig: TAKE 1 TABLET BY MOUTH DAILY     Psychiatry:  Antidepressants - SSRI - sertraline Failed - 02/20/2023 10:39 PM      Failed - AST in normal range and within 360 days    AST  Date Value Ref Range Status  05/02/2021 9 0 - 40 IU/L Final         Failed - ALT in normal range and within 360 days    ALT  Date Value Ref Range Status  05/02/2021 13 0 - 32 IU/L Final         Passed - Completed PHQ-2 or PHQ-9 in the last 360 days      Passed - Valid encounter within last 6 months    Recent Outpatient Visits           2 months ago Flu-like symptoms   [place], FNP   3 months ago Annual physical exam   [facility] Bo Merino, FNP   11 months ago Moderate episode of recurrent major depressive disorder [facility])   [facility] Serafina Royals F, FNP   1 year ago Moderate episode of recurrent major depressive disorder [facility])   [facility] Delsa Grana, PA-C   2 years ago Moderate episode of recurrent major depressive disorder [facility])   [facility] Delsa Grana, [place]

## 2023-03-31 ENCOUNTER — Encounter (INDEPENDENT_AMBULATORY_CARE_PROVIDER_SITE_OTHER): Payer: Managed Care, Other (non HMO) | Admitting: Internal Medicine

## 2023-04-29 ENCOUNTER — Other Ambulatory Visit: Payer: Self-pay | Admitting: Nurse Practitioner

## 2023-04-29 DIAGNOSIS — F331 Major depressive disorder, recurrent, moderate: Secondary | ICD-10-CM

## 2023-04-29 DIAGNOSIS — F419 Anxiety disorder, unspecified: Secondary | ICD-10-CM

## 2023-04-30 NOTE — Telephone Encounter (Signed)
Requested medication (s) are due for refill today - little early  Requested medication (s) are on the active medication list -yes  Future visit scheduled -no  Last refill: 02/21/23 #90  Notes to clinic: fails lab protocol for RF- over 1 year 05/02/21  Requested Prescriptions  Pending Prescriptions Disp Refills   sertraline (ZOLOFT) 100 MG tablet [Pharmacy Med Name: Sertraline HCl 100 MG Oral Tablet] 90 tablet 3    Sig: TAKE 1 TABLET BY MOUTH DAILY     Psychiatry:  Antidepressants - SSRI - sertraline Failed - 04/29/2023 10:46 PM      Failed - AST in normal range and within 360 days    AST  Date Value Ref Range Status  05/02/2021 9 0 - 40 IU/L Final         Failed - ALT in normal range and within 360 days    ALT  Date Value Ref Range Status  05/02/2021 13 0 - 32 IU/L Final         Passed - Completed PHQ-2 or PHQ-9 in the last 360 days      Passed - Valid encounter within last 6 months    Recent Outpatient Visits           4 months ago Flu-like symptoms   [facility] Berniece Salines, FNP   5 months ago Annual physical exam   [facility] Berniece Salines, FNP   1 year ago Moderate episode of recurrent major depressive disorder [facility])   [place] [facility] Berniece Salines, FNP   1 year ago Moderate episode of recurrent major depressive disorder [facility])   [facility] Danelle Berry, PA-C   3 years ago Moderate episode of recurrent major depressive disorder [facility])   [facility] Danelle Berry, [place]                 Requested Prescriptions  Pending Prescriptions Disp Refills   sertraline (ZOLOFT) 100 MG tablet [Pharmacy Med Name: Sertraline HCl 100 MG Oral Tablet] 90 tablet 3    Sig: TAKE 1 TABLET BY MOUTH DAILY     Psychiatry:  Antidepressants - SSRI - sertraline Failed - 04/29/2023 10:46 PM      Failed - AST in normal range and within 360 days    AST   Date Value Ref Range Status  05/02/2021 9 0 - 40 IU/L Final         Failed - ALT in normal range and within 360 days    ALT  Date Value Ref Range Status  05/02/2021 13 0 - 32 IU/L Final         Passed - Completed PHQ-2 or PHQ-9 in the last 360 days      Passed - Valid encounter within last 6 months    Recent Outpatient Visits           4 months ago Flu-like symptoms   [facility] Berniece Salines, FNP   5 months ago Annual physical exam   [facility] Della Goo F, FNP   1 year ago Moderate episode of recurrent major depressive disorder [facility])   [facility] Della Goo F, FNP   1 year ago Moderate episode of recurrent major depressive disorder [facility])    [facility] Danelle Berry, PA-C   3 years ago Moderate episode of recurrent major depressive disorder (  [facility])   [facility] Danelle Berry, [place]

## 2023-05-12 ENCOUNTER — Encounter: Payer: Self-pay | Admitting: Nurse Practitioner

## 2023-05-16 ENCOUNTER — Encounter: Payer: Self-pay | Admitting: Nurse Practitioner

## 2023-05-16 ENCOUNTER — Other Ambulatory Visit: Payer: Self-pay

## 2023-05-16 ENCOUNTER — Ambulatory Visit: Payer: Managed Care, Other (non HMO) | Admitting: Nurse Practitioner

## 2023-05-16 VITALS — BP 130/80 | HR 97 | Temp 97.9°F | Resp 16 | Ht 68.0 in | Wt 309.6 lb

## 2023-05-16 DIAGNOSIS — F331 Major depressive disorder, recurrent, moderate: Secondary | ICD-10-CM | POA: Diagnosis not present

## 2023-05-16 DIAGNOSIS — E782 Mixed hyperlipidemia: Secondary | ICD-10-CM | POA: Diagnosis not present

## 2023-05-16 DIAGNOSIS — F419 Anxiety disorder, unspecified: Secondary | ICD-10-CM | POA: Diagnosis not present

## 2023-05-16 DIAGNOSIS — Z6841 Body Mass Index (BMI) 40.0 and over, adult: Secondary | ICD-10-CM

## 2023-05-16 LAB — COMPREHENSIVE METABOLIC PANEL
ALT: 19 IU/L (ref 0–32)
AST: 14 IU/L (ref 0–40)
Albumin/Globulin Ratio: 1.6 (ref 1.2–2.2)
Albumin: 4.3 g/dL (ref 3.9–4.9)
Alkaline Phosphatase: 54 IU/L (ref 44–121)
BUN/Creatinine Ratio: 21 (ref 9–23)
BUN: 14 mg/dL (ref 6–20)
Bilirubin Total: <0.2 mg/dL (ref 0.0–1.2)
CO2: 20 mmol/L (ref 20–29)
Calcium: 9.7 mg/dL (ref 8.7–10.2)
Chloride: 103 mmol/L (ref 96–106)
Creatinine, Ser: 0.67 mg/dL (ref 0.57–1.00)
Globulin, Total: 2.7 g/dL (ref 1.5–4.5)
Glucose: 107 mg/dL — ABNORMAL HIGH (ref 70–99)
Potassium: 4.5 mmol/L (ref 3.5–5.2)
Sodium: 138 mmol/L (ref 134–144)
Total Protein: 7 g/dL (ref 6.0–8.5)
eGFR: 118 mL/min/{1.73_m2} (ref >59)

## 2023-05-16 LAB — LIPID PANEL
Chol/HDL Ratio: 3.4 ratio (ref 0.0–4.4)
Cholesterol, Total: 178 mg/dL (ref 100–199)
HDL: 53 mg/dL (ref >39)
LDL Chol Calc (NIH): 97 mg/dL (ref 0–99)
Triglycerides: 163 mg/dL — ABNORMAL HIGH (ref 0–149)
VLDL Cholesterol Cal: 28 mg/dL (ref 5–40)

## 2023-05-16 LAB — CBC WITH DIFFERENTIAL/PLATELET
Basophils Absolute: 0 10*3/uL (ref 0.0–0.2)
Basos: 0 %
EOS (ABSOLUTE): 0.2 10*3/uL (ref 0.0–0.4)
Eos: 2 %
Hematocrit: 41.7 % (ref 34.0–46.6)
Hemoglobin: 13.7 g/dL (ref 11.1–15.9)
Immature Grans (Abs): 0 10*3/uL (ref 0.0–0.1)
Immature Granulocytes: 0 %
Lymphocytes Absolute: 3.3 10*3/uL — ABNORMAL HIGH (ref 0.7–3.1)
Lymphs: 42 %
MCH: 28.7 pg (ref 26.6–33.0)
MCHC: 32.9 g/dL (ref 31.5–35.7)
MCV: 87 fL (ref 79–97)
Monocytes Absolute: 0.4 10*3/uL (ref 0.1–0.9)
Monocytes: 5 %
Neutrophils Absolute: 4 10*3/uL (ref 1.4–7.0)
Neutrophils: 51 %
Platelets: 270 10*3/uL (ref 150–450)
RBC: 4.78 x10E6/uL (ref 3.77–5.28)
RDW: 12.6 % (ref 11.7–15.4)
WBC: 8 10*3/uL (ref 3.4–10.8)

## 2023-05-16 LAB — HEPATITIS C ANTIBODY: Hep C Virus Ab: NONREACTIVE

## 2023-05-16 LAB — TSH: TSH: 1.21 u[IU]/mL (ref 0.450–4.500)

## 2023-05-16 MED ORDER — ALPRAZOLAM 0.5 MG PO TABS: 0.5000 mg | ORAL_TABLET | Freq: Two times a day (BID) | ORAL | 1 refills | Status: DC | PRN

## 2023-05-16 MED ORDER — SERTRALINE HCL 25 MG PO TABS: 25.0000 mg | ORAL_TABLET | Freq: Every day | ORAL | 0 refills | Status: DC

## 2023-05-16 NOTE — Progress Notes (Addendum)
BP 130/80   Pulse 97   Temp 97.9 F (36.6 C) (Oral)   Resp 16   Ht 5\' 8"  (1.727 m)   Wt (!) 309 lb 9.6 oz (140.4 kg)   SpO2 97%   BMI 47.07 kg/m    Subjective:    Patient ID: Terry Zhang, female    DOB: 03-Jan-1991, 32 y.o.   MRN: [id number]  HPI: Terry Zhang is a 32 y.o. female, here with mom  Chief Complaint  Patient presents with   Depression   Anxiety   Hyperlipidemia   Depression/anxiety: she is currently taking zoloft 100 mg daily and xanax 0.5 mg two times a day as needed ( she says recently she says she has been needing to take it 2 -3 times a week) due to increase stress at home .  She says a prescription typically lasts her a year.  She reports that the zoloft probably should go up.  Will increase to 125 mg daily.       05/16/2023    2:48 PM 12/19/2022    2:17 PM 11/15/2022    1:02 PM 03/07/2022    1:25 PM 05/01/2021    9:23 AM  Depression screen PHQ 2/9  Decreased Interest 1 0 1 1 1   Down, Depressed, Hopeless 1 0 1 1 1   PHQ - 2 Score 2 0 2 2 2   Altered sleeping 1  0 1 0  Tired, decreased energy 2  2 2 2   Change in appetite 0  0 0 0  Feeling bad or failure about yourself  1  0 0 0  Trouble concentrating 1  1 1 1   Moving slowly or fidgety/restless 0  0 0 0  Suicidal thoughts 0  0 0 0  PHQ-9 Score 7  5 6 5   Difficult doing work/chores Not difficult at all  Somewhat difficult Somewhat difficult Not difficult at all       05/16/2023    2:48 PM 11/15/2022    1:03 PM 03/07/2022    1:27 PM 05/01/2021    9:24 AM  GAD 7 : Generalized Anxiety Score  Nervous, Anxious, on Edge 2 1 2 1   Control/stop worrying 1 0 1 1  Worry too much - different things 1 0 1 1  Trouble relaxing 1 1 2 1   Restless 1 1 1 1   Easily annoyed or irritable 0 1 1 1   Afraid - awful might happen 0 0 0 0  Total GAD 7 Score 6 4 8 6   Anxiety Difficulty Not difficult at all Not difficult at all Somewhat difficult Not difficult at all   Hyperlipidemia: her last LDL was 97 on  05/15/2023. She is not taking anything for cholesterol.  She is working on lifestyle modification.  Obesity: her weight today is 309 lbs with a BMI of 47.07.  She has been working on lifestyle modification.  Comorbidities include hyperlipidemia depression, anxiety.  Commend continuing working on lifestyle modification including eating a well-balanced diet with portion control and increasing physical activity as tolerated.  Relevant past medical, surgical, family and social history reviewed and updated as indicated. Interim medical history since our last visit reviewed. Allergies and medications reviewed and updated.  Review of Systems  Constitutional: Negative for fever or weight change.  Respiratory: Negative for cough and shortness of breath.   Cardiovascular: Negative for chest pain or palpitations.  Gastrointestinal: Negative for abdominal pain, no bowel changes.  Musculoskeletal: Negative for gait problem or joint swelling.  Skin: Negative for rash.  Neurological: Negative for dizziness or headache.  No other specific complaints in a complete review of systems (except as listed in HPI above).      Objective:    BP 130/80   Pulse 97   Temp 97.9 F (36.6 C) (Oral)   Resp 16   Ht 5\' 8"  (1.727 m)   Wt (!) 309 lb 9.6 oz (140.4 kg)   SpO2 97%   BMI 47.07 kg/m   Wt Readings from Last 3 Encounters:  05/16/23 (!) 309 lb 9.6 oz (140.4 kg)  11/15/22 297 lb (134.7 kg)  03/07/22 287 lb 1.6 oz (130.2 kg)    Physical Exam  Constitutional: Patient appears well-developed and well-nourished. Obese  No distress.  HEENT: head atraumatic, normocephalic, pupils equal and reactive to light, neck supple, throat within normal limits Cardiovascular: Normal rate, regular rhythm and normal heart sounds.  No murmur heard. No BLE edema. Pulmonary/Chest: Effort normal and breath sounds normal. No respiratory distress. Abdominal: Soft.  There is no tenderness. Psychiatric: Patient has a normal mood  and affect. behavior is normal. Judgment and thought content normal.   Results for orders placed or performed in visit on 11/18/22  Lipid panel  Result Value Ref Range   Triglycerides 196 (A) 40 - 160   Cholesterol 260 (A) 0 - 200   HDL 60 35 - 70   LDL Cholesterol 164   Hemoglobin A1c  Result Value Ref Range   Hemoglobin A1C 5.9       Assessment & Plan:   Problem List Items Addressed This Visit       Other   Anxiety disorder    increase Zoloft 225 mg daily.  Continue taking Xanax 0.5 mg 2 times a day as needed.      Relevant Medications   sertraline (ZOLOFT) 25 MG tablet   ALPRAZolam (XANAX) 0.5 MG tablet   Hyperlipidemia - Primary    Continue working on lifestyle modification.      Moderate episode of recurrent major depressive disorder (HCC)    increase Zoloft 225 mg daily.  Continue taking Xanax 0.5 mg 2 times a day as needed.      Relevant Medications   sertraline (ZOLOFT) 25 MG tablet   ALPRAZolam (XANAX) 0.5 MG tablet   Class 3 severe obesity due to excess calories with serious comorbidity and body mass index (BMI) of 45.0 to 49.9 in adult [facility])    Continue working on lifestyle modification.        Follow up plan: Return in about 4 weeks (around 06/13/2023) for follow up virtual ok.

## 2023-05-16 NOTE — Assessment & Plan Note (Signed)
Continue working on lifestyle modification.  

## 2023-05-16 NOTE — Assessment & Plan Note (Signed)
increase Zoloft 225 mg daily.  Continue taking Xanax 0.5 mg 2 times a day as needed. 

## 2023-05-16 NOTE — Assessment & Plan Note (Addendum)
Continue working on lifestyle modification.  

## 2023-05-16 NOTE — Assessment & Plan Note (Signed)
increase Zoloft 225 mg daily.  Continue taking Xanax 0.5 mg 2 times a day as needed.

## 2023-06-02 ENCOUNTER — Encounter (INDEPENDENT_AMBULATORY_CARE_PROVIDER_SITE_OTHER): Payer: Managed Care, Other (non HMO) | Admitting: Internal Medicine

## 2023-06-07 ENCOUNTER — Other Ambulatory Visit: Payer: Self-pay | Admitting: Nurse Practitioner

## 2023-06-07 DIAGNOSIS — F419 Anxiety disorder, unspecified: Secondary | ICD-10-CM

## 2023-06-07 DIAGNOSIS — F331 Major depressive disorder, recurrent, moderate: Secondary | ICD-10-CM

## 2023-06-09 ENCOUNTER — Encounter (INDEPENDENT_AMBULATORY_CARE_PROVIDER_SITE_OTHER): Payer: Managed Care, Other (non HMO) | Admitting: Family Medicine

## 2023-06-09 NOTE — Telephone Encounter (Signed)
Requested Prescriptions  Pending Prescriptions Disp Refills   sertraline (ZOLOFT) 25 MG tablet [Pharmacy Med Name: SERTRALINE HCL 25 MG TABLET] 90 tablet 0    Sig: TAKE 1 TABLET (25 MG TOTAL) BY MOUTH DAILY.     Psychiatry:  Antidepressants - SSRI - sertraline Passed - 06/07/2023 11:33 AM      Passed - AST in normal range and within 360 days    AST  Date Value Ref Range Status  05/15/2023 14 0 - 40 IU/L Final         Passed - ALT in normal range and within 360 days    ALT  Date Value Ref Range Status  05/15/2023 19 0 - 32 IU/L Final         Passed - Completed PHQ-2 or PHQ-9 in the last 360 days      Passed - Valid encounter within last 6 months    Recent Outpatient Visits           3 weeks ago Mixed hyperlipidemia   [facility] Berniece Salines, FNP   5 months ago Flu-like symptoms   [facility] Berniece Salines, FNP   6 months ago Annual physical exam   [facility] Della Goo F, FNP   1 year ago Moderate episode of recurrent major depressive disorder [facility])   [facility] Della Goo F, FNP   2 years ago Moderate episode of recurrent major depressive disorder [facility])   [facility] Danelle Berry, PA-C       Future Appointments             In 4 days Zane Herald, Rudolpho Sevin, FNP [facility], [facility]

## 2023-06-12 NOTE — Progress Notes (Signed)
Name: Terry Zhang   MRN: [id number]    DOB: [birth year]   Date:06/13/2023       Progress Note  Subjective  Chief Complaint  Chief Complaint  Patient presents with   Depression    4 week follow up, medication was increased   Anxiety    I connected with  [name]  on 06/13/23 at 8:00 am by a video enabled telemedicine application and verified that I am speaking with the correct person using two identifiers.  I discussed the limitations of evaluation and management by telemedicine and the availability of in person appointments. The patient expressed understanding and agreed to proceed with a virtual visit  Staff also discussed with the patient that there may be a patient responsible charge related to this service. Patient Location: home Provider Location: [facility] Additional Individuals present: alone  HPI  Depression/anxiety Medication zoloft 125 mg daily, increased at last visit, xanax 0.5 mg two times a day as needed Compliant yes Side effects none PHQ9 improved GAD improved Doing well will continue with current treatment.      06/13/2023    7:57 AM 05/16/2023    2:48 PM 12/19/2022    2:17 PM 11/15/2022    1:02 PM 03/07/2022    1:25 PM  Depression screen PHQ 2/9  Decreased Interest 0 1 0 1 1  Down, Depressed, Hopeless 0 1 0 1 1  PHQ - 2 Score 0 2 0 2 2  Altered sleeping 0 1  0 1  Tired, decreased energy 1 2  2 2   Change in appetite 0 0  0 0  Feeling bad or failure about yourself  0 1  0 0  Trouble concentrating 1 1  1 1   Moving slowly or fidgety/restless 0 0  0 0  Suicidal thoughts 0 0  0 0  PHQ-9 Score 2 7  5 6   Difficult doing work/chores Not difficult at all Not difficult at all  Somewhat difficult Somewhat difficult       06/13/2023    7:57 AM 05/16/2023    2:48 PM 11/15/2022    1:03 PM 03/07/2022    1:27 PM  GAD 7 : Generalized Anxiety Score  Nervous, Anxious, on Edge 1 2 1 2   Control/stop worrying 1 1 0 1  Worry too much - different things 1 1 0  1  Trouble relaxing 1 1 1 2   Restless 0 1 1 1   Easily annoyed or irritable 0 0 1 1  Afraid - awful might happen 0 0 0 0  Total GAD 7 Score 4 6 4 8   Anxiety Difficulty Not difficult at all Not difficult at all Not difficult at all Somewhat difficult      Patient Active Problem List   Diagnosis Date Noted   Vitamin D deficiency 11/15/2022   Chronic pansinusitis 11/15/2022   Moderate episode of recurrent major depressive disorder (HCC) 04/13/2020   Class 3 severe obesity due to excess calories with serious comorbidity and body mass index (BMI) of 45.0 to 49.9 in adult (HCC) 04/13/2020   Hyperlipidemia 02/23/2016   Anxiety disorder 08/03/2015    Social History   Tobacco Use   Smoking status: Every Day    Packs/day: .25    Types: Cigarettes   Smokeless tobacco: Never  Substance Use Topics   Alcohol use: No    Alcohol/week: 0.0 standard drinks of alcohol     Current Outpatient Medications:    ALPRAZolam (XANAX) 0.5 MG tablet, Take  1 tablet (0.5 mg total) by mouth 2 (two) times daily as needed for anxiety., Disp: 90 tablet, Rfl: 1   sertraline (ZOLOFT) 100 MG tablet, TAKE 1 TABLET BY MOUTH DAILY, Disp: 90 tablet, Rfl: 3   simvastatin (ZOCOR) 20 MG tablet, Take 1 tablet (20 mg total) by mouth at bedtime., Disp: 90 tablet, Rfl: 3   SPRINTEC 28 0.25-35 MG-MCG tablet, TAKE 1 TABLET BY MOUTH DAILY, Disp: 84 tablet, Rfl: 3   sertraline (ZOLOFT) 25 MG tablet, Take 1 tablet (25 mg total) by mouth daily., Disp: 90 tablet, Rfl: 1  Allergies  Allergen Reactions   Montelukast Palpitations    anxiety    I personally reviewed active problem list, medication list, allergies, notes from last encounter with the patient/caregiver today.  ROS  Constitutional: Negative for fever or weight change.  Respiratory: Negative for cough and shortness of breath.   Cardiovascular: Negative for chest pain or palpitations.  Gastrointestinal: Negative for abdominal pain, no bowel changes.   Musculoskeletal: Negative for gait problem or joint swelling.  Skin: Negative for rash.  Neurological: Negative for dizziness or headache.  No other specific complaints in a complete review of systems (except as listed in HPI above).   Objective  Virtual encounter, vitals not obtained.  There is no height or weight on file to calculate BMI.  Nursing Note and Vital Signs reviewed.  Physical Exam  Awake, alert and oriented, speaking in complete sentences  No results found for this or any previous visit (from the past 72 hour(s)).  Assessment & Plan  Problem List Items Addressed This Visit       Other   Anxiety disorder    Doing well since increasing zoloft to 125 mg daily.  Continue with current treatment. She also uses xanax 0.5 mg two times a day as needed.       Relevant Medications   sertraline (ZOLOFT) 25 MG tablet   Moderate episode of recurrent major depressive disorder (HCC)    Doing well since increasing zoloft to 125 mg daily.  Continue with current treatment. She also uses xanax 0.5 mg two times a day as needed.       Relevant Medications   sertraline (ZOLOFT) 25 MG tablet     -Red flags and when to present for emergency care or RTC including fever >101.66F, chest pain, shortness of breath, new/worsening/un-resolving symptoms,  reviewed with patient at time of visit. Follow up and care instructions discussed and provided in AVS. - I discussed the assessment and treatment plan with the patient. The patient was provided an opportunity to ask questions and all were answered. The patient agreed with the plan and demonstrated an understanding of the instructions.  I provided 15 minutes of non-face-to-face time during this encounter.  Berniece Salines, FNP

## 2023-06-13 ENCOUNTER — Encounter: Payer: Self-pay | Admitting: Nurse Practitioner

## 2023-06-13 ENCOUNTER — Other Ambulatory Visit: Payer: Self-pay

## 2023-06-13 ENCOUNTER — Telehealth (INDEPENDENT_AMBULATORY_CARE_PROVIDER_SITE_OTHER): Payer: Managed Care, Other (non HMO) | Admitting: Nurse Practitioner

## 2023-06-13 DIAGNOSIS — F419 Anxiety disorder, unspecified: Secondary | ICD-10-CM

## 2023-06-13 DIAGNOSIS — F331 Major depressive disorder, recurrent, moderate: Secondary | ICD-10-CM | POA: Diagnosis not present

## 2023-06-13 MED ORDER — SERTRALINE HCL 25 MG PO TABS: 25.0000 mg | ORAL_TABLET | Freq: Every day | ORAL | 1 refills | Status: DC

## 2023-06-13 NOTE — Assessment & Plan Note (Signed)
Doing well since increasing zoloft to 125 mg daily.  Continue with current treatment. She also uses xanax 0.5 mg two times a day as needed.

## 2023-06-13 NOTE — Assessment & Plan Note (Signed)
Doing well since increasing zoloft to 125 mg daily.  Continue with current treatment. She also uses xanax 0.5 mg two times a day as needed.  

## 2023-07-02 ENCOUNTER — Other Ambulatory Visit: Payer: Self-pay | Admitting: Nurse Practitioner

## 2023-07-02 ENCOUNTER — Other Ambulatory Visit: Payer: Self-pay

## 2023-07-07 ENCOUNTER — Encounter: Payer: Self-pay | Admitting: Nurse Practitioner

## 2023-07-09 ENCOUNTER — Encounter: Payer: Self-pay | Admitting: Nurse Practitioner

## 2023-07-09 ENCOUNTER — Encounter (INDEPENDENT_AMBULATORY_CARE_PROVIDER_SITE_OTHER): Payer: Managed Care, Other (non HMO) | Admitting: Family Medicine

## 2023-07-09 ENCOUNTER — Ambulatory Visit: Payer: Managed Care, Other (non HMO) | Admitting: Nurse Practitioner

## 2023-07-09 ENCOUNTER — Other Ambulatory Visit: Payer: Self-pay | Admitting: Nurse Practitioner

## 2023-07-09 ENCOUNTER — Other Ambulatory Visit: Payer: Self-pay

## 2023-07-09 VITALS — BP 126/72 | HR 96 | Temp 98.3°F | Resp 16 | Ht 68.0 in | Wt 306.3 lb

## 2023-07-09 DIAGNOSIS — R7303 Prediabetes: Secondary | ICD-10-CM | POA: Diagnosis not present

## 2023-07-09 DIAGNOSIS — Z6841 Body Mass Index (BMI) 40.0 and over, adult: Secondary | ICD-10-CM | POA: Diagnosis not present

## 2023-07-09 MED ORDER — SEMAGLUTIDE-WEIGHT MANAGEMENT 0.25 MG/0.5ML ~~LOC~~ SOAJ: 0.2500 mg | SUBCUTANEOUS | 0 refills | Status: AC

## 2023-07-09 MED ORDER — SEMAGLUTIDE-WEIGHT MANAGEMENT 1.7 MG/0.75ML ~~LOC~~ SOAJ: 1.7000 mg | SUBCUTANEOUS | 0 refills | Status: DC

## 2023-07-09 MED ORDER — SEMAGLUTIDE-WEIGHT MANAGEMENT 0.5 MG/0.5ML ~~LOC~~ SOAJ: 0.5000 mg | SUBCUTANEOUS | 0 refills | Status: AC

## 2023-07-09 MED ORDER — SEMAGLUTIDE-WEIGHT MANAGEMENT 2.4 MG/0.75ML ~~LOC~~ SOAJ
2.4000 mg | SUBCUTANEOUS | 0 refills | Status: DC
Start: 2023-11-02 — End: 2023-10-27

## 2023-07-09 MED ORDER — SEMAGLUTIDE-WEIGHT MANAGEMENT 1 MG/0.5ML ~~LOC~~ SOAJ: 1.0000 mg | SUBCUTANEOUS | 0 refills | Status: DC

## 2023-07-09 NOTE — Progress Notes (Signed)
BP 126/72   Pulse 96   Temp 98.3 F (36.8 C) (Oral)   Resp 16   Ht 5\' 8"  (1.727 m)   Wt (!) 306 lb 4.8 oz (138.9 kg)   SpO2 98%   BMI 46.57 kg/m    Subjective:    Patient ID: Terry Zhang, female    DOB: 1991/08/30, 32 y.o.   MRN: [id number]  HPI: Terry Zhang is a 32 y.o. female  Chief Complaint  Patient presents with   Obesity    Discuss weight options   Hyperlipidemia   Obesity: patient would like to discuss weight loss options.   Current weight : 306.3 lbs BMI: 46.57 Highest weight:315 lbs Struggled with weight for 14 years Treatment Tried: Wegovy, Phentermine, Saxenda, life style modification for 14 years off and on Comorbidities: HLD, anxiety ,depression  Will get A1C last A1c was 08/21/2022 and was 5.9 which is in the prediabetic range, will recheck lab Start wegovy.  Follow up in 3 months.   Relevant past medical, surgical, family and social history reviewed and updated as indicated. Interim medical history since our last visit reviewed. Allergies and medications reviewed and updated.  Review of Systems  Constitutional: Negative for fever or weight change.  Respiratory: Negative for cough and shortness of breath.   Cardiovascular: Negative for chest pain or palpitations.  Gastrointestinal: Negative for abdominal pain, no bowel changes.  Musculoskeletal: Negative for gait problem or joint swelling.  Skin: Negative for rash.  Neurological: Negative for dizziness or headache.  No other specific complaints in a complete review of systems (except as listed in HPI above).      Objective:    BP 126/72   Pulse 96   Temp 98.3 F (36.8 C) (Oral)   Resp 16   Ht 5\' 8"  (1.727 m)   Wt (!) 306 lb 4.8 oz (138.9 kg)   SpO2 98%   BMI 46.57 kg/m   Wt Readings from Last 3 Encounters:  07/09/23 (!) 306 lb 4.8 oz (138.9 kg)  05/16/23 (!) 309 lb 9.6 oz (140.4 kg)  11/15/22 297 lb (134.7 kg)    Physical Exam  Constitutional: Patient appears  well-developed and well-nourished. Obese  No distress.  HEENT: head atraumatic, normocephalic, pupils equal and reactive to light,neck supple, throat within normal limits Cardiovascular: Normal rate, regular rhythm and normal heart sounds.  No murmur heard. No BLE edema. Pulmonary/Chest: Effort normal and breath sounds normal. No respiratory distress. Abdominal: Soft.  There is no tenderness. Psychiatric: Patient has a normal mood and affect. behavior is normal. Judgment and thought content normal.  Results for orders placed or performed in visit on 11/18/22  Lipid panel  Result Value Ref Range   Triglycerides 196 (A) 40 - 160   Cholesterol 260 (A) 0 - 200   HDL 60 35 - 70   LDL Cholesterol 164   Hemoglobin A1c  Result Value Ref Range   Hemoglobin A1C 5.9       Assessment & Plan:   Problem List Items Addressed This Visit       Other   Class 3 severe obesity due to excess calories with serious comorbidity and body mass index (BMI) of 45.0 to 49.9 in adult [facility]) - Primary    Start wegovy, continue lifestyle modification including eating well balanced diet with portion control.       Relevant Medications   Semaglutide-Weight Management 0.25 MG/0.5ML SOAJ   Semaglutide-Weight Management 0.5 MG/0.5ML SOAJ (Start on 08/07/2023)  Semaglutide-Weight Management 1 MG/0.5ML SOAJ (Start on 09/05/2023)   Semaglutide-Weight Management 1.7 MG/0.75ML SOAJ (Start on 10/04/2023)   Semaglutide-Weight Management 2.4 MG/0.75ML SOAJ (Start on 11/02/2023)   Other Visit Diagnoses     Prediabetes       check A1C   Relevant Orders   Hemoglobin A1c        Follow up plan: Return in about 3 months (around 10/09/2023) for follow up from when you start wegovy.

## 2023-07-09 NOTE — Assessment & Plan Note (Addendum)
Start wegovy, continue lifestyle modification including eating well balanced diet with portion control.

## 2023-07-10 NOTE — Telephone Encounter (Signed)
Requested medication (s) are due for refill today: alternative request  Requested medication (s) are on the active medication list: yes  Last refill:  07/09/23  Future visit scheduled: yes  Notes to clinic:  harmacy comment: Alternative Requested:PA REQUIRE.      Requested Prescriptions  Pending Prescriptions Disp Refills   WEGOVY 2.4 MG/0.75ML SOAJ [Pharmacy Med Name: WEGOVY 2.4 MG/0.75 ML PEN]  0    Sig: INJECT 2.4 MG INTO THE SKIN ONCE A WEEK FOR 28 DAYS.     Endocrinology:  Diabetes - GLP-1 Receptor Agonists - semaglutide Failed - 07/09/2023  2:02 PM      Failed - HBA1C in normal range and within 180 days    Hemoglobin A1C  Date Value Ref Range Status  08/21/2022 5.9  Final         Passed - Cr in normal range and within 360 days    Creatinine  Date Value Ref Range Status  01/23/2012 0.67 0.60 - 1.30 mg/dL Final   Creatinine, Ser  Date Value Ref Range Status  05/15/2023 0.67 0.57 - 1.00 mg/dL Final   Creatinine, Urine  Date Value Ref Range Status  12/22/2018 385 mg/dL Final         Passed - Valid encounter within last 6 months    Recent Outpatient Visits           Yesterday Class 3 severe obesity due to excess calories with serious comorbidity and body mass index (BMI) of 45.0 to 49.9 in adult [facility])   [facility] Berniece Salines, FNP   3 weeks ago Moderate episode of recurrent major depressive disorder [facility])   [place] [facility] Berniece Salines, FNP   1 month ago Mixed hyperlipidemia   [facility] Berniece Salines, FNP   6 months ago Flu-like symptoms   [facility]ce Salines, FNP   7 months ago Annual physical exam   [facility] Zane Herald, Rudolpho Sevin, FNP               WEGOVY 0.25 MG/0.5ML [place] [Pharmacy Med Name: WEGOVY 0.25 MG/0.5 ML PEN]  0    Sig: INJECT 0.25 MG INTO THE SKIN ONCE A WEEK FOR 28 DAYS     Endocrinology:   Diabetes - GLP-1 Receptor Agonists - semaglutide Failed - 07/09/2023  2:02 PM      Failed - HBA1C in normal range and within 180 days    Hemoglobin A1C  Date Value Ref Range Status  08/21/2022 5.9  Final         Passed - Cr in normal range and within 360 days    Creatinine  Date Value Ref Range Status  01/23/2012 0.67 0.60 - 1.30 mg/dL Final   Creatinine, Ser  Date Value Ref Range Status  05/15/2023 0.67 0.57 - 1.00 mg/dL Final   Creatinine, Urine  Date Value Ref Range Status  12/22/2018 385 mg/dL Final         Passed - Valid encounter within last 6 months    Recent Outpatient Visits           Yesterday Class 3 severe obesity due to excess calories with serious comorbidity and body mass index (BMI) of 45.0 to 49.9 in adult [facility])   [facility] Berniece Salines, FNP   3 weeks ago Moderate episode of recurrent major depressive disorder [facility])   [facility] Berniece Salines, FNP   1 month ago Mixed hyperlipidemia   [facility] Bernie[facility] Salines, FNP   6 months ago Flu-like symptoms   [facility] Bernie[facility] Salines, FNP   7 months ago Annual physical exam   [facility] Berniece Salines, FNP               [facility] 1 MG/0.5ML [place] [Pharmacy Med Name: WEGOVY 1 MG/0.5 ML PEN]  0    Sig: INJECT 1 MG INTO THE SKIN ONCE A WEEK FOR 28 DAYS.     Endocrinology:  Diabetes - GLP-1 Receptor Agonists - semaglutide Failed - 07/09/2023  2:02 PM      Failed - HBA1C in normal range and within 180 days    Hemoglobin A1C  Date Value Ref Range Status  08/21/2022 5.9  Final         Passed - Cr in normal range and within 360 days    Creatinine  Date Value Ref Range Status  01/23/2012 0.67 0.60 - 1.30 mg/dL Final   Creatinine, Ser  Date Value Ref Range Status  05/15/2023 0.67 0.57 - 1.00 mg/dL Final   Creatinine, Urine  Date Value Ref Range Status  12/22/2018  385 mg/dL Final         Passed - Valid encounter within last 6 months    Recent Outpatient Visits           Yesterday Class 3 severe obesity due to excess calories with serious comorbidity and body mass index (BMI) of 45.0 to 49.9 in adult [facility])   [facility] Berniece Salines, FNP   3 weeks ago Moderate episode of recurrent major depressive disorder [facility])   [facility] Berniece Salines, FNP   1 month ago Mixed hyperlipidemia   [facility] Berniece Salines, FNP   6 months ago Flu-like symptoms   [facility] Berniece Salines, FNP   7 months ago Annual physical exam   [facility] Zane Herald, Rudolpho Sevin, FNP               WEGOVY 0.5 MG/0.5ML [place] [Pharmacy Med Name: WEGOVY 0.5 MG/0.5 ML PEN]  0    Sig: INJECT 0.5 MG INTO THE SKIN ONCE A WEEK FOR 28 DAYS.     Endocrinology:  Diabetes - GLP-1 Receptor Agonists - semaglutide Failed - 07/09/2023  2:02 PM      Failed - HBA1C in normal range and within 180 days    Hemoglobin A1C  Date Value Ref Range Status  08/21/2022 5.9  Final         Passed - Cr in normal range and within 360 days    Creatinine  Date Value Ref Range Status  01/23/2012 0.67 0.60 - 1.30 mg/dL Final   Creatinine, Ser  Date Value Ref Range Status  05/15/2023 0.67 0.57 - 1.00 mg/dL Final   Creatinine, Urine  Date Value Ref Range Status  12/22/2018 385 mg/dL Final         Passed - Valid encounter within last 6 months    Recent Outpatient Visits           Yesterday Class 3 severe obesity due to excess calories with serious comorbidity and body mass index (BMI) of 45.0 to 49.9 in adult [facility])   [facility] Berniece Salines, [place]  3 weeks ago Moderate episode of recurrent major depressive disorder [facility])   [place] [facility] Berniece Salines, FNP   1 month ago Mixed hyperlipidemia   [facility] Berniece Salines, FNP   6 months ago Flu-like symptoms   [facility] Bernie[facility] Salines, FNP   7 months ago Annual physical exam   [facility] Zane Herald, Rudolpho Sevin, FNP               WEGOVY 1.7 MG/0.75ML SOAJ [Pharmacy Med Name: WEGOVY 1.7 MG/0.75 ML PEN]  0    Sig: INJECT 1.7 MG INTO THE SKIN ONCE A WEEK FOR 28 DAYS.     Endocrinology:  Diabetes - GLP-1 Receptor Agonists - semaglutide Failed - 07/09/2023  2:02 PM      Failed - HBA1C in normal range and within 180 days    Hemoglobin A1C  Date Value Ref Range Status  08/21/2022 5.9  Final         Passed - Cr in normal range and within 360 days    Creatinine  Date Value Ref Range Status  01/23/2012 0.67 0.60 - 1.30 mg/dL Final   Creatinine, Ser  Date Value Ref Range Status  05/15/2023 0.67 0.57 - 1.00 mg/dL Final   Creatinine, Urine  Date Value Ref Range Status  12/22/2018 385 mg/dL Final         Passed - Valid encounter within last 6 months    Recent Outpatient Visits           Yesterday Class 3 severe obesity due to excess calories with serious comorbidity and body mass index (BMI) of 45.0 to 49.9 in adult [facility])   [facility] Berniece Salines, FNP   3 weeks ago Moderate episode of recurrent major depressive disorder [facility])   [facility] Berniece Salines, FNP   1 month ago Mixed hyperlipidemia   [facility] Berniece Salines, FNP   6 months ago Flu-like symptoms   [facility] Bernie[facility], F[facility]   7 months ago Annual physical exam   [facility] Berniece Salines, [place]

## 2023-07-11 ENCOUNTER — Other Ambulatory Visit: Payer: Self-pay | Admitting: Nurse Practitioner

## 2023-07-14 ENCOUNTER — Encounter: Payer: Self-pay | Admitting: Nurse Practitioner

## 2023-07-14 NOTE — Telephone Encounter (Signed)
Requested medications are due for refill today.  no  Requested medications are on the active medications list.  yes  Last refill. 07/09/2023 - not filled  Future visit scheduled.   no  Notes to clinic.    Pharmacy comment: Alternative Requested:NOT COVERED.      Requested Prescriptions  Pending Prescriptions Disp Refills   WEGOVY 0.25 MG/0.5ML SOAJ [Pharmacy Med Name: WEGOVY 0.25 MG/0.5 ML PEN]  0    Sig: INJECT 0.25 MG INTO THE SKIN ONCE A WEEK FOR 28 DAYS     Endocrinology:  Diabetes - GLP-1 Receptor Agonists - semaglutide Failed - 07/11/2023  5:11 PM      Failed - HBA1C in normal range and within 180 days    Hemoglobin A1C  Date Value Ref Range Status  08/21/2022 5.9  Final         Passed - Cr in normal range and within 360 days    Creatinine  Date Value Ref Range Status  01/23/2012 0.67 0.60 - 1.30 mg/dL Final   Creatinine, Ser  Date Value Ref Range Status  05/15/2023 0.67 0.57 - 1.00 mg/dL Final   Creatinine, Urine  Date Value Ref Range Status  12/22/2018 385 mg/dL Final         Passed - Valid encounter within last 6 months    Recent Outpatient Visits           5 days ago Class 3 severe obesity due to excess calories with serious comorbidity and body mass index (BMI) of 45.0 to 49.9 in adult [facility])   [facility] [address] [facility] Berniece Salines, FNP   1 month ago Moderate episode of recurrent major depressive disorder [facility])   [facility] Berniece Salines, FNP   1 month ago Mixed hyperlipidemia   [facility] Bernie[facility] Salines, FNP   6 months ago Flu-like symptoms   [facility] Berniece Salines, FNP   8 months ago Annual physical exam   [facility] Berniece Salines, [place]

## 2023-07-18 NOTE — Telephone Encounter (Signed)
PA initiated for [facility] through Cover My Meds.

## 2023-09-10 ENCOUNTER — Other Ambulatory Visit: Payer: Self-pay

## 2023-09-10 ENCOUNTER — Encounter: Payer: Self-pay | Admitting: Nurse Practitioner

## 2023-09-10 ENCOUNTER — Telehealth (INDEPENDENT_AMBULATORY_CARE_PROVIDER_SITE_OTHER): Payer: Managed Care, Other (non HMO) | Admitting: Nurse Practitioner

## 2023-09-10 VITALS — Ht 68.0 in | Wt 309.0 lb

## 2023-09-10 DIAGNOSIS — U071 COVID-19: Secondary | ICD-10-CM | POA: Diagnosis not present

## 2023-09-10 MED ORDER — NIRMATRELVIR/RITONAVIR (PAXLOVID)TABLET
3.0000 | ORAL_TABLET | Freq: Two times a day (BID) | ORAL | 0 refills | Status: AC
Start: 2023-09-10 — End: 2023-09-15

## 2023-09-10 NOTE — Progress Notes (Signed)
Name: Terry Zhang   MRN: [id number]    DOB: 09-Aug-1991   Date:09/10/2023       Progress Note  Subjective  Chief Complaint  Chief Complaint  Patient presents with   Covid Positive    Symptoms started 3 days ago. Stuffy nose, headache, muscle pain, scratchy throat    I connected with  [name]  on 09/10/23 at 11:00 AM EDT by a video enabled telemedicine application and verified that I am speaking with the correct person using two identifiers.  I discussed the limitations of evaluation and management by telemedicine and the availability of in person appointments. The patient expressed understanding and agreed to proceed with a virtual visit  Staff also discussed with the patient that there may be a patient responsible charge related to this service. Patient Location: home Provider Location: [facility] Additional Individuals present: alone  HPI  Covid positive: symptoms started three days ago, she tested positive for covid at home, she would like to take paxlovid, last gfr in normal range, she denies possibility of pregnancy, LMC: 2 weeks ago  -Fever: no -Cough: no -Shortness of breath: no -Wheezing: no -Chest congestion: no -Nasal congestion: yes -Runny nose: yes -Post nasal drip: yes -Sore throat: yes -Sinus pressure: yes -Headache: yes -Face pain: no -Ear pain:  no -Ear pressure: no Body aches: yes -Relief with OTC cold/cough medications: took some benadryl and tylenol  Recommend taking zyrtec, flonase, mucinex, vitamin d, vitamin c, and zinc. Push fluids and get rest.     Patient Active Problem List   Diagnosis Date Noted   Vitamin D deficiency 11/15/2022   Chronic pansinusitis 11/15/2022   Moderate episode of recurrent major depressive disorder (HCC) 04/13/2020   Class 3 severe obesity due to excess calories with serious comorbidity and body mass index (BMI) of 45.0 to 49.9 in adult [facility]) 04/13/2020   Hyperlipidemia 02/23/2016   Anxiety disorder  08/03/2015    Social History   Tobacco Use   Smoking status: Every Day    Current packs/day: 0.25    Types: Cigarettes   Smokeless tobacco: Never  Substance Use Topics   Alcohol use: No    Alcohol/week: 0.0 standard drinks of alcohol     Current Outpatient Medications:    ALPRAZolam (XANAX) 0.5 MG tablet, Take 1 tablet (0.5 mg total) by mouth 2 (two) times daily as needed for anxiety., Disp: 90 tablet, Rfl: 1   nirmatrelvir/ritonavir (PAXLOVID) 20 x 150 MG & 10 x 100MG  TABS, Take 3 tablets by mouth 2 (two) times daily for 5 days. (Take nirmatrelvir 150 mg two tablets twice daily for 5 days and ritonavir 100 mg one tablet twice daily for 5 days) Patient GFR is 118, Disp: 30 tablet, Rfl: 0   [START ON 10/04/2023] Semaglutide-Weight Management 1.7 MG/0.75ML SOAJ, Inject 1.7 mg into the skin once a week for 28 days., Disp: 3 mL, Rfl: 0   [START ON 11/02/2023] Semaglutide-Weight Management 2.4 MG/0.75ML SOAJ, Inject 2.4 mg into the skin once a week for 28 days., Disp: 3 mL, Rfl: 0   sertraline (ZOLOFT) 100 MG tablet, TAKE 1 TABLET BY MOUTH DAILY, Disp: 90 tablet, Rfl: 3   sertraline (ZOLOFT) 25 MG tablet, Take 1 tablet (25 mg total) by mouth daily., Disp: 90 tablet, Rfl: 1   simvastatin (ZOCOR) 20 MG tablet, Take 1 tablet (20 mg total) by mouth at bedtime., Disp: 90 tablet, Rfl: 3   SPRINTEC 28 0.25-35 MG-MCG tablet, TAKE 1 TABLET BY MOUTH DAILY,  Disp: 84 tablet, Rfl: 3  Allergies  Allergen Reactions   Montelukast Palpitations    anxiety    I personally reviewed active problem list, medication list, allergies with the patient/caregiver today.  ROS  Ten systems reviewed and is negative except as mentioned in HPI    Objective  Virtual encounter, vitals not obtained.  Body mass index is 46.98 kg/m.  Nursing Note and Vital Signs reviewed.  Physical Exam  Awake, alert and oriented, speaking in complete sentences  No results found for this or any previous visit (from the past 72  hour(s)).  Assessment & Plan  Problem List Items Addressed This Visit   None Visit Diagnoses     COVID-19    -  Primary   Recommend taking zyrtec, flonase, mucinex, vitamin d, vitamin c, and zinc. Push fluids and get rest.   sent in prescription for paxlovid.   Relevant Medications   nirmatrelvir/ritonavir (PAXLOVID) 20 x 150 MG & 10 x 100MG  TABS        -Red flags and when to present for emergency care or RTC including fever >101.35F, chest pain, shortness of breath, new/worsening/un-resolving symptoms,  reviewed with patient at time of visit. Follow up and care instructions discussed and provided in AVS. - I discussed the assessment and treatment plan with the patient. The patient was provided an opportunity to ask questions and all were answered. The patient agreed with the plan and demonstrated an understanding of the instructions.  I provided 15 minutes of non-face-to-face time during this encounter.  Berniece Salines, FNP

## 2023-09-14 ENCOUNTER — Encounter: Payer: Self-pay | Admitting: Nurse Practitioner

## 2023-09-15 ENCOUNTER — Ambulatory Visit
Admission: RE | Admit: 2023-09-15 | Discharge: 2023-09-15 | Disposition: A | Discharge status: Home / Self Care | Payer: Managed Care, Other (non HMO) | Attending: Nurse Practitioner | Admitting: Nurse Practitioner

## 2023-09-15 ENCOUNTER — Other Ambulatory Visit: Payer: Self-pay | Admitting: Nurse Practitioner

## 2023-09-15 ENCOUNTER — Ambulatory Visit
Admission: RE | Admit: 2023-09-15 | Discharge: 2023-09-15 | Disposition: A | Discharge status: Home / Self Care | Payer: Managed Care, Other (non HMO) | Source: Ambulatory Visit | Attending: Nurse Practitioner | Admitting: Nurse Practitioner

## 2023-09-15 DIAGNOSIS — U071 COVID-19: Secondary | ICD-10-CM | POA: Insufficient documentation

## 2023-09-15 MED ORDER — BENZONATATE 100 MG PO CAPS
200.0000 mg | ORAL_CAPSULE | Freq: Two times a day (BID) | ORAL | 0 refills | Status: DC | PRN
Start: 2023-09-15 — End: 2024-01-30

## 2023-09-15 MED ORDER — PROMETHAZINE-DM 6.25-15 MG/5ML PO SYRP
5.0000 mL | ORAL_SOLUTION | Freq: Four times a day (QID) | ORAL | 0 refills | Status: DC | PRN
Start: 2023-09-15 — End: 2024-01-30

## 2023-10-07 ENCOUNTER — Encounter: Payer: Self-pay | Admitting: Nurse Practitioner

## 2023-10-10 ENCOUNTER — Telehealth: Payer: Managed Care, Other (non HMO) | Admitting: Nurse Practitioner

## 2023-10-10 DIAGNOSIS — U099 Post covid-19 condition, unspecified: Secondary | ICD-10-CM

## 2023-10-10 DIAGNOSIS — R0981 Nasal congestion: Secondary | ICD-10-CM

## 2023-10-10 DIAGNOSIS — F419 Anxiety disorder, unspecified: Secondary | ICD-10-CM

## 2023-10-10 DIAGNOSIS — F331 Major depressive disorder, recurrent, moderate: Secondary | ICD-10-CM

## 2023-10-10 DIAGNOSIS — E782 Mixed hyperlipidemia: Secondary | ICD-10-CM

## 2023-10-10 DIAGNOSIS — J0140 Acute pansinusitis, unspecified: Secondary | ICD-10-CM

## 2023-10-10 MED ORDER — SIMVASTATIN 20 MG PO TABS
20.0000 mg | ORAL_TABLET | Freq: Every day | ORAL | 3 refills | Status: DC
Start: 2023-10-10 — End: 2024-10-14

## 2023-10-10 MED ORDER — PREDNISONE 10 MG (21) PO TBPK
ORAL_TABLET | ORAL | 0 refills | Status: DC
Start: 2023-10-10 — End: 2024-01-30

## 2023-10-10 MED ORDER — SERTRALINE HCL 100 MG PO TABS
100.0000 mg | ORAL_TABLET | Freq: Every day | ORAL | 3 refills | Status: DC
Start: 2023-10-10 — End: 2024-01-30

## 2023-10-10 MED ORDER — SERTRALINE HCL 25 MG PO TABS
25.0000 mg | ORAL_TABLET | Freq: Every day | ORAL | 3 refills | Status: DC
Start: 2023-10-10 — End: 2024-01-30

## 2023-10-10 NOTE — Progress Notes (Signed)
Name: Terry Zhang   MRN: [id number]    DOB: [birth year]   Date:10/10/2023       Progress Note  Subjective  Chief Complaint  Chief Complaint  Patient presents with   Nasal Congestion    Still having congestion after having COVID,pt curious could do a round of the step down prednisone    I connected with  [name]  on 10/10/23 at 0900 am by a video enabled telemedicine application and verified that I am speaking with the correct person using two identifiers.  I discussed the limitations of evaluation and management by telemedicine and the availability of in person appointments. The patient expressed understanding and agreed to proceed with a virtual visit  Staff also discussed with the patient that there may be a patient responsible charge related to this service. Patient Location: home Provider Location: [facility] Additional Individuals present: alone  HPI  History of covid infection:  was diagnosed with covid on 09/10/2023. Treated with paxlovid. Pt reports that she is doing better but has had some rebound congestion. She would like a steroid taper to knock it out. She denies any fever, shortness of breath. She reports she just has some nasal congestion.    Recommend taking zyrtec, flonase, mucinex.  Will also send in steroid taper.   Patient Active Problem List   Diagnosis Date Noted   Vitamin D deficiency 11/15/2022   Chronic pansinusitis 11/15/2022   Moderate episode of recurrent major depressive disorder (HCC) 04/13/2020   Class 3 severe obesity due to excess calories with serious comorbidity and body mass index (BMI) of 45.0 to 49.9 in adult [facility]) 04/13/2020   Hyperlipidemia 02/23/2016   Anxiety disorder 08/03/2015    Social History   Tobacco Use   Smoking status: Every Day    Current packs/day: 0.25    Types: Cigarettes   Smokeless tobacco: Never  Substance Use Topics   Alcohol use: No    Alcohol/week: 0.0 standard drinks of alcohol     Current  Outpatient Medications:    ALPRAZolam (XANAX) 0.5 MG tablet, Take 1 tablet (0.5 mg total) by mouth 2 (two) times daily as needed for anxiety., Disp: 90 tablet, Rfl: 1   benzonatate (TESSALON) 100 MG capsule, Take 2 capsules (200 mg total) by mouth 2 (two) times daily as needed for cough., Disp: 20 capsule, Rfl: 0   predniSONE (STERAPRED UNI-PAK 21 TAB) 10 MG (21) TBPK tablet, Take as directed on package.  (60 mg po on day 1, 50 mg po on day 2...), Disp: 21 tablet, Rfl: 0   promethazine-dextromethorphan (PROMETHAZINE-DM) 6.25-15 MG/5ML syrup, Take 5 mLs by mouth 4 (four) times daily as needed for cough., Disp: 118 mL, Rfl: 0   Semaglutide-Weight Management 1.7 MG/0.75ML SOAJ, Inject 1.7 mg into the skin once a week for 28 days., Disp: 3 mL, Rfl: 0   [START ON 11/02/2023] Semaglutide-Weight Management 2.4 MG/0.75ML SOAJ, Inject 2.4 mg into the skin once a week for 28 days., Disp: 3 mL, Rfl: 0   SPRINTEC 28 0.25-35 MG-MCG tablet, TAKE 1 TABLET BY MOUTH DAILY, Disp: 84 tablet, Rfl: 3   sertraline (ZOLOFT) 100 MG tablet, Take 1 tablet (100 mg total) by mouth daily., Disp: 90 tablet, Rfl: 3   sertraline (ZOLOFT) 25 MG tablet, Take 1 tablet (25 mg total) by mouth daily., Disp: 90 tablet, Rfl: 3   simvastatin (ZOCOR) 20 MG tablet, Take 1 tablet (20 mg total) by mouth at bedtime., Disp: 90 tablet, Rfl: 3  Allergies  Allergen Reactions   Montelukast Palpitations    anxiety    I personally reviewed active problem list, medication list, allergies, notes from last encounter with the patient/caregiver today.  ROS  Ten systems reviewed and is negative except as mentioned in HPI    Objective  Virtual encounter, vitals not obtained.  There is no height or weight on file to calculate BMI.  Nursing Note and Vital Signs reviewed.  Physical Exam  Awake, alert and oriented, speaking in complete sentences  No results found for this or any previous visit (from the past 72 hour(s)).  Assessment &  Plan  There are no diagnoses linked to this encounter.  -Red flags and when to present for emergency care or RTC including fever >101.4F, chest pain, shortness of breath, new/worsening/un-resolving symptoms,  reviewed with patient at time of visit. Follow up and care instructions discussed and provided in AVS. - I discussed the assessment and treatment plan with the patient. The patient was provided an opportunity to ask questions and all were answered. The patient agreed with the plan and demonstrated an understanding of the instructions.  I provided 15 minutes of non-face-to-face time during this encounter.  Berniece Salines, FNP

## 2023-10-10 NOTE — Assessment & Plan Note (Signed)
Refills sent to [facility], no changes

## 2023-10-10 NOTE — Addendum Note (Signed)
Addended by: Della Goo F on: 10/10/2023 10:16 AM   Modules accepted: Level of Service

## 2023-10-22 ENCOUNTER — Other Ambulatory Visit: Payer: Self-pay | Admitting: Nurse Practitioner

## 2023-10-22 DIAGNOSIS — E66813 Obesity, class 3: Secondary | ICD-10-CM

## 2023-10-24 NOTE — Telephone Encounter (Signed)
Requested medication (s) are due for refill today: yes  Requested medication (s) are on the active medication list: yes  Last refill:  07/09/23  Future visit scheduled: no  Notes to clinic:  Unable to refill per protocol due to failed labs, no updated A1c results.      Requested Prescriptions  Pending Prescriptions Disp Refills   WEGOVY 0.25 MG/0.5ML SOAJ [Pharmacy Med Name: WEGOVY 0.25 MG/0.5 ML PEN]      Sig: INJECT 0.25 MG INTO THE SKIN ONCE A WEEK FOR 28 DAYS--NEED TO ENROLL WEIGHT WATCH PROGRAM     Endocrinology:  Diabetes - GLP-1 Receptor Agonists - semaglutide Failed - 10/22/2023 11:53 AM      Failed - HBA1C in normal range and within 180 days    Hemoglobin A1C  Date Value Ref Range Status  08/21/2022 5.9  Final         Passed - Cr in normal range and within 360 days    Creatinine  Date Value Ref Range Status  01/23/2012 0.67 0.60 - 1.30 mg/dL Final   Creatinine, Ser  Date Value Ref Range Status  05/15/2023 0.67 0.57 - 1.00 mg/dL Final   Creatinine, Urine  Date Value Ref Range Status  12/22/2018 385 mg/dL Final         Passed - Valid encounter within last 6 months    Recent Outpatient Visits           2 weeks ago Subacute pansinusitis   [facility] Berniece Salines, FNP   1 month ago COVID-19   [facility] Berniece Salines, FNP   3 months ago Class 3 severe obesity due to excess calories with serious comorbidity and body mass index (BMI) of 45.0 to 49.9 in adult [facility])   [facility] Berniece Salines, FNP   4 months ago Moderate episode of recurrent major depressive disorder [facility])   [facility] Berniece Salines, FNP   5 months ago Mixed hyperlipidemia   [facility] Berniece Salines, [place]

## 2023-10-27 ENCOUNTER — Other Ambulatory Visit: Payer: Self-pay | Admitting: Nurse Practitioner

## 2023-10-27 DIAGNOSIS — E66813 Obesity, class 3: Secondary | ICD-10-CM

## 2023-10-27 MED ORDER — SEMAGLUTIDE-WEIGHT MANAGEMENT 2.4 MG/0.75ML ~~LOC~~ SOAJ
2.4000 mg | SUBCUTANEOUS | 5 refills | Status: DC
Start: 2023-11-02 — End: 2023-11-05

## 2023-11-04 ENCOUNTER — Encounter: Payer: Self-pay | Admitting: Nurse Practitioner

## 2023-11-05 ENCOUNTER — Other Ambulatory Visit: Payer: Self-pay | Admitting: Nurse Practitioner

## 2023-11-05 MED ORDER — WEGOVY 1 MG/0.5ML ~~LOC~~ SOAJ: 1.0000 mg | SUBCUTANEOUS | 0 refills | Status: DC

## 2023-11-05 NOTE — Telephone Encounter (Signed)
Pharmacy do not have. Only have higher doses

## 2023-11-29 ENCOUNTER — Other Ambulatory Visit: Payer: Self-pay | Admitting: Nurse Practitioner

## 2023-11-29 DIAGNOSIS — E782 Mixed hyperlipidemia: Secondary | ICD-10-CM

## 2023-12-02 NOTE — Telephone Encounter (Signed)
Requested Prescriptions  Pending Prescriptions Disp Refills   simvastatin (ZOCOR) 20 MG tablet [Pharmacy Med Name: SIMVASTATIN 20 MG TABLET] 30 tablet 11    Sig: TAKE 1 TABLET BY MOUTH EVERYDAY AT BEDTIME     Cardiovascular:  Antilipid - Statins Failed - 11/29/2023  1:04 AM      Failed - Lipid Panel in normal range within the last 12 months    Cholesterol, Total  Date Value Ref Range Status  05/15/2023 178 100 - 199 mg/dL Final   LDL Chol Calc (NIH)  Date Value Ref Range Status  05/15/2023 97 0 - 99 mg/dL Final   HDL  Date Value Ref Range Status  05/15/2023 53 >39 mg/dL Final   Triglycerides  Date Value Ref Range Status  05/15/2023 163 (H) 0 - 149 mg/dL Final         Passed - Patient is not pregnant      Passed - Valid encounter within last 12 months    Recent Outpatient Visits           1 month ago Subacute pansinusitis   [facility] Berniece Salines, FNP   2 months ago COVID-19   [facility] Della Goo F, FNP   4 months ago Class 3 severe obesity due to excess calories with serious comorbidity and body mass index (BMI) of 45.0 to 49.9 in adult [facility])   [facility] Della Goo F, FNP   5 months ago Moderate episode of recurrent major depressive disorder [facility])   [facility] Berniece Salines, FNP   6 months ago Mixed hyperlipidemia   [facility] Berniece Salines, [place]

## 2023-12-16 ENCOUNTER — Other Ambulatory Visit: Payer: Self-pay | Admitting: Nurse Practitioner

## 2023-12-16 DIAGNOSIS — Z3041 Encounter for surveillance of contraceptive pills: Secondary | ICD-10-CM

## 2023-12-17 NOTE — Telephone Encounter (Signed)
Rx 02/17/23 #84 [id number]-too soon Requested Prescriptions  Pending Prescriptions Disp Refills   MONO-LINYAH 0.25-35 MG-MCG tablet [Pharmacy Med Name: MONO-LINYAH TAB #] 84 tablet 3    Sig: TAKE 1 TABLET BY MOUTH DAILY     OB/GYN:  Contraceptives Failed - 12/17/2023  9:45 AM      Failed - Patient is not a smoker      Passed - Last BP in normal range    BP Readings from Last 1 Encounters:  07/09/23 126/72         Passed - Valid encounter within last 12 months    Recent Outpatient Visits           2 months ago Subacute pansinusitis   [facility] Berniece Salines, FNP   3 months ago COVID-19   [facility] Della Goo F, FNP   5 months ago Class 3 severe obesity due to excess calories with serious comorbidity and body mass index (BMI) of 45.0 to 49.9 in adult [facility])   [facility] Della Goo F, FNP   6 months ago Moderate episode of recurrent major depressive disorder [facility])   [facility] Berniece Salines, FNP   7 months ago Mixed hyperlipidemia   [facility] Berniece Salines, [place]

## 2024-01-03 ENCOUNTER — Other Ambulatory Visit: Payer: Self-pay | Admitting: Nurse Practitioner

## 2024-01-06 NOTE — Telephone Encounter (Signed)
 Requested Prescriptions  Pending Prescriptions Disp Refills   Semaglutide -Weight Management (WEGOVY ) 1 MG/0.5ML SOAJ [Pharmacy Med Name: WEGOVY  1 MG/0.5 ML PEN] 2 mL 0    Sig: INJECT 1MG  INTO THE SKIN ONCE A WEEK     Endocrinology:  Diabetes - GLP-1 Receptor Agonists - semaglutide  Failed - 01/06/2024  3:09 PM      Failed - HBA1C in normal range and within 180 days    Hemoglobin A1C  Date Value Ref Range Status  08/21/2022 5.9  Final         Passed - Cr in normal range and within 360 days    Creatinine  Date Value Ref Range Status  01/23/2012 0.67 0.60 - 1.30 mg/dL Final   Creatinine, Ser  Date Value Ref Range Status  05/15/2023 0.67 0.57 - 1.00 mg/dL Final   Creatinine, Urine  Date Value Ref Range Status  12/22/2018 385 mg/dL Final         Passed - Valid encounter within last 6 months    Recent Outpatient Visits           2 months ago Subacute pansinusitis   [facility] Gareth Mliss FALCON, FNP   3 months ago COVID-19   [facility] Gareth Mliss FALCON, FNP   6 months ago Class 3 severe obesity due to excess calories with serious comorbidity and body mass index (BMI) of 45.0 to 49.9 in adult [facility])   [facility] Gareth Mliss FALCON, FNP   6 months ago Moderate episode of recurrent major depressive disorder [facility])   [facility] Gareth Mliss FALCON, FNP   7 months ago Mixed hyperlipidemia   [facility] Gareth Mliss FALCON, FNP       Future Appointments             In 1 week Gareth, Mliss FALCON, FNP [facility], [facility]

## 2024-01-13 ENCOUNTER — Ambulatory Visit: Payer: Managed Care, Other (non HMO) | Admitting: Nurse Practitioner

## 2024-01-21 ENCOUNTER — Ambulatory Visit: Payer: Managed Care, Other (non HMO) | Admitting: Nurse Practitioner

## 2024-01-30 ENCOUNTER — Ambulatory Visit (INDEPENDENT_AMBULATORY_CARE_PROVIDER_SITE_OTHER): Payer: Managed Care, Other (non HMO) | Admitting: Nurse Practitioner

## 2024-01-30 ENCOUNTER — Encounter: Payer: Self-pay | Admitting: Nurse Practitioner

## 2024-01-30 VITALS — BP 116/78 | HR 91 | Temp 98.0°F | Resp 18 | Ht 68.0 in | Wt 288.3 lb

## 2024-01-30 DIAGNOSIS — F331 Major depressive disorder, recurrent, moderate: Secondary | ICD-10-CM

## 2024-01-30 DIAGNOSIS — R112 Nausea with vomiting, unspecified: Secondary | ICD-10-CM

## 2024-01-30 DIAGNOSIS — F419 Anxiety disorder, unspecified: Secondary | ICD-10-CM

## 2024-01-30 DIAGNOSIS — Z6841 Body Mass Index (BMI) 40.0 and over, adult: Secondary | ICD-10-CM

## 2024-01-30 DIAGNOSIS — E66813 Obesity, class 3: Secondary | ICD-10-CM | POA: Diagnosis not present

## 2024-01-30 MED ORDER — SERTRALINE HCL 100 MG PO TABS
100.0000 mg | ORAL_TABLET | Freq: Every day | ORAL | 3 refills | Status: DC
Start: 2024-01-30 — End: 2024-10-14

## 2024-01-30 MED ORDER — ALPRAZOLAM 0.5 MG PO TABS
0.5000 mg | ORAL_TABLET | Freq: Two times a day (BID) | ORAL | 1 refills | Status: DC | PRN
Start: 2024-01-30 — End: 2024-10-14

## 2024-01-30 MED ORDER — SERTRALINE HCL 25 MG PO TABS
25.0000 mg | ORAL_TABLET | Freq: Every day | ORAL | 3 refills | Status: DC
Start: 2024-01-30 — End: 2024-10-06

## 2024-01-30 NOTE — Progress Notes (Signed)
BP 116/78   Pulse 91   Temp 98 F (36.7 C)   Resp 18   Ht 5\' 8"  (1.727 m)   Wt 288 lb 4.8 oz (130.8 kg)   LMP 01/07/2024   SpO2 97%   BMI 43.84 kg/m    Subjective:    Patient ID: Terry Zhang, female    DOB: [birth year], 33 y.o.   MRN: [id number]  HPI: Terry Zhang is a 33 y.o. female  Chief Complaint  Patient presents with   Medical Management of Chronic Issues   Medication Refill    wegovy    Discussed the use of AI scribe software for clinical note transcription with the patient, who gave verbal consent to proceed.  History of Present Illness   The patient, with a history of obesity, depression and anxiety, presents with nausea and vomiting last week, after increasing her dose of wegovy. She reports vomiting four times since starting the medication, but notes that she could have 'choked it down.' She describes a sensation of fullness her abdomen, but denies sharp pain. She also reports diarrhea, which she attributes to a possible stomach bug.  The patient has lost 18 pounds since starting wegovy, but notes that the nausea and vomiting have worsened since increasing the dose. She also reports difficulty eating greasy foods and has stopped eating fast food.  In addition to the wegovy, the patient is also taking Zoloft and alprazolam, which she uses a couple of times a week. she reports mental health is good with current treatment.       01/30/2024    9:51 AM 10/10/2023    8:52 AM 09/10/2023    9:38 AM  Depression screen PHQ 2/9  Decreased Interest 0 0 0  Down, Depressed, Hopeless 0 0 0  PHQ - 2 Score 0 0 0  Altered sleeping 0 0   Tired, decreased energy 0 0   Change in appetite 0 0   Feeling bad or failure about yourself  0 0   Trouble concentrating 0 0   Moving slowly or fidgety/restless 0 0   Suicidal thoughts 0 0   PHQ-9 Score 0 0   Difficult doing work/chores Not difficult at all Not difficult at all     Relevant past medical, surgical, family  and social history reviewed and updated as indicated. Interim medical history since our last visit reviewed. Allergies and medications reviewed and updated.  Review of Systems  Constitutional: Negative for fever or weight change.  Respiratory: Negative for cough and shortness of breath.   Cardiovascular: Negative for chest pain or palpitations.  Gastrointestinal: positive for abdominal pain, no bowel changes.  Musculoskeletal: Negative for gait problem or joint swelling.  Skin: Negative for rash.  Neurological: Negative for dizziness or headache.  No other specific complaints in a complete review of systems (except as listed in HPI above).      Objective:    BP 116/78   Pulse 91   Temp 98 F (36.7 C)   Resp 18   Ht 5\' 8"  (1.727 m)   Wt 288 lb 4.8 oz (130.8 kg)   LMP 01/07/2024   SpO2 97%   BMI 43.84 kg/m    Wt Readings from Last 3 Encounters:  01/30/24 288 lb 4.8 oz (130.8 kg)  09/10/23 (!) 309 lb (140.2 kg)  07/09/23 (!) 306 lb 4.8 oz (138.9 kg)    Physical Exam Vitals reviewed.  Constitutional:      Appearance: Normal appearance.  HENT:     Head: Normocephalic.  Cardiovascular:     Rate and Rhythm: Normal rate and regular rhythm.  Pulmonary:     Effort: Pulmonary effort is normal.     Breath sounds: Normal breath sounds.  Musculoskeletal:        General: Normal range of motion.  Skin:    General: Skin is warm and dry.  Neurological:     General: No focal deficit present.     Mental Status: She is alert and oriented to person, place, and time. Mental status is at baseline.  Psychiatric:        Mood and Affect: Mood normal.        Behavior: Behavior normal.        Thought Content: Thought content normal.        Judgment: Judgment normal.     Results for orders placed or performed in visit on 11/18/22  Lipid panel   Collection Time: 08/21/22 12:00 AM  Result Value Ref Range   Triglycerides 196 (A) 40 - 160   Cholesterol 260 (A) 0 - 200   HDL 60 35 - 70    LDL Cholesterol 164   Hemoglobin A1c   Collection Time: 08/21/22 12:00 AM  Result Value Ref Range   Hemoglobin A1C 5.9        Assessment & Plan:   Problem List Items Addressed This Visit       Other   Anxiety disorder   Relevant Medications   ALPRAZolam (XANAX) 0.5 MG tablet   sertraline (ZOLOFT) 100 MG tablet   sertraline (ZOLOFT) 25 MG tablet   Moderate episode of recurrent major depressive disorder (HCC)   Relevant Medications   ALPRAZolam (XANAX) 0.5 MG tablet   sertraline (ZOLOFT) 100 MG tablet   sertraline (ZOLOFT) 25 MG tablet   Class 3 severe obesity due to excess calories with serious comorbidity and body mass index (BMI) of 45.0 to 49.9 in adult [facility]) - Primary   Relevant Orders   CBC with Differential/Platelet   Comprehensive metabolic panel   Lipase   Other Visit Diagnoses       Nausea and vomiting, unspecified vomiting type       Relevant Orders   CBC with Differential/Platelet   Comprehensive metabolic panel   Lipase        Assessment and Plan    Nausea and Vomiting on Wegovy Patient reports intermittent nausea and vomiting since starting Wegovy. Describes vomiting as bile-like, particularly when not eating enough. No severe abdominal pain. Physical examination of the abdomen was non-tender. -Order comprehensive metabolic panel to assess liver and pancreatic function. -next dose of wegovy next week -If labs are normal, continue current [facility] regimen.  Weight Loss Patient has lost 18 pounds since starting Wegovy. -Continue current weight loss regimen.  Anxiety/depression Patient reports using Alprazolam a couple of times a week. continue zoloft 125 mg daily -Refill Alprazolam, zoloft prescription.  Nasal Congestion Patient reports successful weaning off Afrin. -Continue current regimen without Afrin.        Follow up plan: Return in about 6 months (around 07/29/2024) for follow up.

## 2024-01-31 LAB — CBC WITH DIFFERENTIAL/PLATELET
Basophils Absolute: 0 10*3/uL (ref 0.0–0.2)
Basos: 0 %
EOS (ABSOLUTE): 0.2 10*3/uL (ref 0.0–0.4)
Eos: 3 %
Hematocrit: 39.5 % (ref 34.0–46.6)
Hemoglobin: 12.7 g/dL (ref 11.1–15.9)
Immature Grans (Abs): 0 10*3/uL (ref 0.0–0.1)
Immature Granulocytes: 0 %
Lymphocytes Absolute: 2.9 10*3/uL (ref 0.7–3.1)
Lymphs: 43 %
MCH: 29.1 pg (ref 26.6–33.0)
MCHC: 32.2 g/dL (ref 31.5–35.7)
MCV: 91 fL (ref 79–97)
Monocytes Absolute: 0.4 10*3/uL (ref 0.1–0.9)
Monocytes: 5 %
Neutrophils Absolute: 3.2 10*3/uL (ref 1.4–7.0)
Neutrophils: 49 %
Platelets: 237 10*3/uL (ref 150–450)
RBC: 4.36 x10E6/uL (ref 3.77–5.28)
RDW: 13.1 % (ref 11.7–15.4)
WBC: 6.7 10*3/uL (ref 3.4–10.8)

## 2024-01-31 LAB — COMPREHENSIVE METABOLIC PANEL
ALT: 13 [IU]/L (ref 0–32)
AST: 12 [IU]/L (ref 0–40)
Albumin: 4 g/dL (ref 3.9–4.9)
Alkaline Phosphatase: 43 [IU]/L — ABNORMAL LOW (ref 44–121)
BUN/Creatinine Ratio: 16 (ref 9–23)
BUN: 9 mg/dL (ref 6–20)
Bilirubin Total: <0.2 mg/dL (ref 0.0–1.2)
CO2: 19 mmol/L — ABNORMAL LOW (ref 20–29)
Calcium: 9.2 mg/dL (ref 8.7–10.2)
Chloride: 106 mmol/L (ref 96–106)
Creatinine, Ser: 0.55 mg/dL — ABNORMAL LOW (ref 0.57–1.00)
Globulin, Total: 2.3 g/dL (ref 1.5–4.5)
Glucose: 92 mg/dL (ref 70–99)
Potassium: 4.2 mmol/L (ref 3.5–5.2)
Sodium: 139 mmol/L (ref 134–144)
Total Protein: 6.3 g/dL (ref 6.0–8.5)
eGFR: 123 mL/min/{1.73_m2} (ref >59)

## 2024-01-31 LAB — LIPASE: Lipase: 51 U/L (ref 14–72)

## 2024-02-02 ENCOUNTER — Encounter: Payer: Self-pay | Admitting: Nurse Practitioner

## 2024-02-02 ENCOUNTER — Other Ambulatory Visit: Payer: Self-pay | Admitting: Nurse Practitioner

## 2024-02-03 NOTE — Telephone Encounter (Signed)
Per note in chart 02/02/24- continue - next due 7/25

## 2024-03-02 ENCOUNTER — Encounter: Payer: Self-pay | Admitting: Nurse Practitioner

## 2024-03-05 ENCOUNTER — Telehealth: Payer: Self-pay

## 2024-03-05 NOTE — Telephone Encounter (Signed)
 Prior auth denied.

## 2024-03-05 NOTE — Telephone Encounter (Signed)
 Cover My Meds prior auth   Semaglutide-Weight Management (WEGOVY) 1 MG/0.5ML SOAJ   KEY [id number]

## 2024-06-20 ENCOUNTER — Other Ambulatory Visit: Payer: Self-pay | Admitting: Nurse Practitioner

## 2024-06-20 DIAGNOSIS — Z6841 Body Mass Index (BMI) 40.0 and over, adult: Secondary | ICD-10-CM

## 2024-06-20 DIAGNOSIS — E66813 Obesity, class 3: Secondary | ICD-10-CM

## 2024-06-22 ENCOUNTER — Ambulatory Visit: Admitting: Nurse Practitioner

## 2024-06-22 ENCOUNTER — Ambulatory Visit
Admission: RE | Admit: 2024-06-22 | Discharge: 2024-06-22 | Disposition: A | Discharge status: Home / Self Care | Source: Ambulatory Visit | Attending: Nurse Practitioner | Admitting: Nurse Practitioner

## 2024-06-22 ENCOUNTER — Encounter: Payer: Self-pay | Admitting: Nurse Practitioner

## 2024-06-22 ENCOUNTER — Other Ambulatory Visit: Payer: Self-pay | Admitting: Nurse Practitioner

## 2024-06-22 VITALS — BP 122/78 | HR 95 | Temp 98.2°F | Resp 18 | Ht 68.0 in | Wt 258.2 lb

## 2024-06-22 DIAGNOSIS — R1011 Right upper quadrant pain: Secondary | ICD-10-CM | POA: Diagnosis not present

## 2024-06-22 LAB — POCT URINALYSIS DIPSTICK
Appearance: NORMAL
Bilirubin, UA: NEGATIVE
Blood, UA: NEGATIVE
Glucose, UA: NEGATIVE
Ketones, UA: NEGATIVE
Leukocytes, UA: NEGATIVE
Nitrite, UA: NEGATIVE
Protein, UA: NEGATIVE
Spec Grav, UA: 1.02 (ref 1.010–1.025)
Urobilinogen, UA: 0.2 U/dL
pH, UA: 6.5 (ref 5.0–8.0)

## 2024-06-22 LAB — POCT URINE PREGNANCY: Preg Test, Ur: NEGATIVE

## 2024-06-22 MED ORDER — ONDANSETRON 4 MG PO TBDP
4.0000 mg | ORAL_TABLET | Freq: Three times a day (TID) | ORAL | 0 refills | Status: DC | PRN
Start: 2024-06-22 — End: 2024-10-14

## 2024-06-22 NOTE — Progress Notes (Signed)
 BP 122/78   Pulse 95   Temp 98.2 F (36.8 C)   Resp 18   Ht 5' 8 (1.727 m)   Wt 258 lb 3.2 oz (117.1 kg)   LMP 05/24/2024   SpO2 97%   BMI 39.26 kg/m    Subjective:    Patient ID: Laymon LOISE Piety, female    DOB: March 29, 1991, 33 y.o.   MRN: [id number]  HPI: ZOI DEVINE is a 33 y.o. female  Chief Complaint  Patient presents with   Abdominal Pain    W/ vomiting and belching for 1 week    Discussed the use of AI scribe software for clinical note transcription with the patient, who gave verbal consent to proceed.  History of Present Illness DANYELLE BROOKOVER is a 33 year old female who presents with recurrent vomiting and abdominal pain.  She began experiencing vomiting last Thursday, having not vomited since January. The vomiting occurs every other day and is accompanied by 'sulfur burps,' bloating, and gas. Initially, she felt better after eating on Friday, but similar symptoms returned on Saturday morning.  On Monday, she developed a 'crazy pain' under her right breast, radiating around her ribs to her back. She initially thought she might have pulled something due to vomiting. The pain is described as a pressure pain rather than sharp, worsening after eating. It subsided last night but returned this morning.  No fever, diarrhea, or urination issues, although she notes decreased urination due to dehydration. She reports constipation, having taken a laxative on Sunday night with minimal bowel movement results.  She is currently taking Wegovy , which she has been on without a change in dose. She has lost weight, now weighing 258 pounds, down from 310 pounds.  Her last menstrual period was about 28 days ago, and she is currently experiencing PMS. She denies the possibility of pregnancy and reports no recent trauma or bruising. She recently stopped taking birth control after 14 months, which she speculates might be affecting her symptoms.         06/22/2024     9:46 AM 01/30/2024    9:51 AM 10/10/2023    8:52 AM  Depression screen PHQ 2/9  Decreased Interest 0 0 0  Down, Depressed, Hopeless 0 0 0  PHQ - 2 Score 0 0 0  Altered sleeping 0 0 0  Tired, decreased energy 0 0 0  Change in appetite 0 0 0  Feeling bad or failure about yourself  0 0 0  Trouble concentrating 0 0 0  Moving slowly or fidgety/restless 0 0 0  Suicidal thoughts 0 0 0  PHQ-9 Score 0 0 0  Difficult doing work/chores Not difficult at all Not difficult at all Not difficult at all    Relevant past medical, surgical, family and social history reviewed and updated as indicated. Interim medical history since our last visit reviewed. Allergies and medications reviewed and updated.  Review of Systems  Per HPI unless specifically indicated above     Objective:     BP 122/78   Pulse 95   Temp 98.2 F (36.8 C)   Resp 18   Ht 5' 8 (1.727 m)   Wt 258 lb 3.2 oz (117.1 kg)   LMP 05/24/2024   SpO2 97%   BMI 39.26 kg/m    Wt Readings from Last 3 Encounters:  06/22/24 258 lb 3.2 oz (117.1 kg)  01/30/24 288 lb 4.8 oz (130.8 kg)  09/10/23 (!) 309 lb (140.2 kg)  Physical Exam Physical Exam MEASUREMENTS: Weight- 258. GENERAL: Alert, cooperative, well developed, no acute distress. HEENT: Normocephalic, normal oropharynx, moist mucous membranes. CHEST: Clear to auscultation bilaterally, no wheezes, rhonchi, or crackles. CARDIOVASCULAR: Normal heart rate and rhythm, S1 and S2 normal without murmurs. ABDOMEN: Soft, tenderness under right upper quadrtant, non-distended, without organomegaly, normal bowel sounds. EXTREMITIES: No cyanosis or edema. NEUROLOGICAL: Cranial nerves grossly intact, moves all extremities without gross motor or sensory deficit.   Results for orders placed or performed in visit on 06/22/24  POCT urinalysis dipstick   Collection Time: 06/22/24 10:22 AM  Result Value Ref Range   Color, UA yellow    Clarity, UA clear    Glucose, UA Negative Negative    Bilirubin, UA neg    Ketones, UA neg    Spec Grav, UA 1.020 1.010 - 1.025   Blood, UA neg    pH, UA 6.5 5.0 - 8.0   Protein, UA Negative Negative   Urobilinogen, UA 0.2 0.2 or 1.0 E.U./dL   Nitrite, UA neg    Leukocytes, UA Negative Negative   Appearance normal    Odor clear   POCT urine pregnancy   Collection Time: 06/22/24 10:22 AM  Result Value Ref Range   Preg Test, Ur Negative Negative          Assessment & Plan:   Problem List Items Addressed This Visit   None Visit Diagnoses       RUQ abdominal pain    -  Primary   Relevant Orders   CBC with Differential/Platelet   Comprehensive metabolic panel with GFR   Lipase   US  ABDOMEN LIMITED RUQ (LIVER/GB)   Amylase   POCT urinalysis dipstick (Completed)   POCT urine pregnancy (Completed)        Assessment and Plan Assessment & Plan Abdominal pain Intermittent pressure-like abdominal pain under the right breast, radiating around the ribs to the back. No fever or diarrhea. Differential diagnosis includes gallbladder issues and pancreatitis, potentially related to GLP-1 agonist (Wegovy ) use. Pain severity is persistent but not intolerable. Discussed potential for gallbladder issues or pancreatitis related to Wegovy . If pancreatitis is confirmed, Wegovy  will be discontinued. If gallbladder issues are confirmed, may pause Wegovy  and consider surgical intervention. - Order blood work to assess for pancreatitis and gallbladder issues. - Order abdominal ultrasound to evaluate gallbladder and pancreas. - Instruct to skip today's dose of Wegovy  to assess if symptoms improve. - Advise to go to the emergency room if pain becomes significantly worse or intolerable.  Nausea and vomiting Nausea and vomiting since last Thursday, with episodes every other day. Symptoms include sulfur burps and bloating. No recent changes in Wegovy  dosage, which has been effective for weight loss. Discussed potential link between symptoms and Wegovy   use. - Order blood work to assess for underlying causes related to nausea and vomiting. - Order abdominal ultrasound to evaluate for gallbladder and pancreas issues. - Instruct to skip today's dose of Wegovy  to assess if symptoms improve.  Dehydration Decreased urination and feeling of dehydration, possibly related to vomiting and decreased fluid intake. No burning sensation during urination.  Constipation Constipation with minimal bowel movement after taking a laxative on Sunday night. No diarrhea reported.  Weight loss Weight loss from 310 lbs to 258 lbs attributed to Wegovy  use. She expresses concern about potential gallbladder issues but values weight loss benefits. Discussed potential need to discontinue Wegovy  if pancreatitis is confirmed. - Monitor weight and assess for any adverse effects related to Wegovy   use. - Instruct to skip today's dose of Wegovy  to assess if symptoms improve.        Follow up plan: Return if symptoms worsen or fail to improve.

## 2024-06-22 NOTE — Telephone Encounter (Signed)
 Requested medication (s) are due for refill today:   Yes  Requested medication (s) are on the active medication list:   Yes  Future visit scheduled:   Seen today 6/24 by Mliss   Last ordered: 02/03/2024 2 ml, 4 refills  Returned for provider review since pt for abd U/S today.   Continue Wegovy  or not?   Requested Prescriptions  Pending Prescriptions Disp Refills   WEGOVY  1 MG/0.5ML SOAJ [Pharmacy Med Name: WEGOVY  1 MG/0.5 ML PEN]  4    Sig: INJECT 1 MG INTO THE SKIN ONE TIME PER WEEK     Endocrinology:  Diabetes - GLP-1 Receptor Agonists - semaglutide  Failed - 06/22/2024  1:21 PM      Failed - HBA1C in normal range and within 180 days    Hemoglobin A1C  Date Value Ref Range Status  08/21/2022 5.9  Final         Failed - Cr in normal range and within 360 days    Creatinine  Date Value Ref Range Status  01/23/2012 0.67 0.60 - 1.30 mg/dL Final   Creatinine, Ser  Date Value Ref Range Status  01/30/2024 0.55 (L) 0.57 - 1.00 mg/dL Final   Creatinine, Urine  Date Value Ref Range Status  12/22/2018 385 mg/dL Final         Failed - Valid encounter within last 6 months    Recent Outpatient Visits           Today RUQ abdominal pain   [facility] Gareth Mliss FALCON, [place]

## 2024-06-23 ENCOUNTER — Ambulatory Visit: Payer: Self-pay | Admitting: Nurse Practitioner

## 2024-06-23 ENCOUNTER — Other Ambulatory Visit: Payer: Self-pay | Admitting: Nurse Practitioner

## 2024-06-23 DIAGNOSIS — R1011 Right upper quadrant pain: Secondary | ICD-10-CM

## 2024-06-23 LAB — CBC WITH DIFFERENTIAL/PLATELET
Basophils Absolute: 0 10*3/uL (ref 0.0–0.2)
Basos: 0 %
EOS (ABSOLUTE): 0.2 10*3/uL (ref 0.0–0.4)
Eos: 3 %
Hematocrit: 43.3 % (ref 34.0–46.6)
Hemoglobin: 14.1 g/dL (ref 11.1–15.9)
Immature Grans (Abs): 0 10*3/uL (ref 0.0–0.1)
Immature Granulocytes: 0 %
Lymphocytes Absolute: 3.2 10*3/uL — ABNORMAL HIGH (ref 0.7–3.1)
Lymphs: 46 %
MCH: 29.5 pg (ref 26.6–33.0)
MCHC: 32.6 g/dL (ref 31.5–35.7)
MCV: 91 fL (ref 79–97)
Monocytes Absolute: 0.5 10*3/uL (ref 0.1–0.9)
Monocytes: 7 %
Neutrophils Absolute: 3 10*3/uL (ref 1.4–7.0)
Neutrophils: 44 %
Platelets: 235 10*3/uL (ref 150–450)
RBC: 4.78 x10E6/uL (ref 3.77–5.28)
RDW: 13.2 % (ref 11.7–15.4)
WBC: 6.8 10*3/uL (ref 3.4–10.8)

## 2024-06-23 LAB — COMPREHENSIVE METABOLIC PANEL WITH GFR
ALT: 17 IU/L (ref 0–32)
AST: 15 IU/L (ref 0–40)
Albumin: 4.6 g/dL (ref 3.9–4.9)
Alkaline Phosphatase: 44 IU/L (ref 44–121)
BUN/Creatinine Ratio: 13 (ref 9–23)
BUN: 8 mg/dL (ref 6–20)
Bilirubin Total: 0.3 mg/dL (ref 0.0–1.2)
CO2: 19 mmol/L — ABNORMAL LOW (ref 20–29)
Calcium: 9.7 mg/dL (ref 8.7–10.2)
Chloride: 104 mmol/L (ref 96–106)
Creatinine, Ser: 0.61 mg/dL (ref 0.57–1.00)
Globulin, Total: 2.1 g/dL (ref 1.5–4.5)
Glucose: 96 mg/dL (ref 70–99)
Potassium: 4.3 mmol/L (ref 3.5–5.2)
Sodium: 141 mmol/L (ref 134–144)
Total Protein: 6.7 g/dL (ref 6.0–8.5)
eGFR: 119 mL/min/{1.73_m2} (ref >59)

## 2024-06-23 LAB — AMYLASE: Amylase: 50 U/L (ref 31–110)

## 2024-06-23 LAB — LIPASE: Lipase: 40 U/L (ref 14–72)

## 2024-07-08 ENCOUNTER — Ambulatory Visit
Admission: RE | Admit: 2024-07-08 | Discharge: 2024-07-08 | Disposition: A | Discharge status: Home / Self Care | Source: Ambulatory Visit | Attending: Nurse Practitioner | Admitting: Nurse Practitioner

## 2024-07-08 DIAGNOSIS — R1011 Right upper quadrant pain: Secondary | ICD-10-CM | POA: Diagnosis present

## 2024-07-08 MED ORDER — TECHNETIUM TC 99M MEBROFENIN IV KIT
5.0000 | PACK | Freq: Once | INTRAVENOUS | Status: AC | PRN
  Administered 2024-07-08: 5.21 via INTRAVENOUS

## 2024-07-27 ENCOUNTER — Other Ambulatory Visit: Payer: Self-pay | Admitting: Nurse Practitioner

## 2024-07-27 DIAGNOSIS — F419 Anxiety disorder, unspecified: Secondary | ICD-10-CM

## 2024-07-27 DIAGNOSIS — F331 Major depressive disorder, recurrent, moderate: Secondary | ICD-10-CM

## 2024-07-28 NOTE — Telephone Encounter (Signed)
 LOV 06/22/2024.  Refused because it's being requested too soon.  01/30/2024 #90, 3 refills sent in.

## 2024-09-27 ENCOUNTER — Other Ambulatory Visit: Payer: Self-pay | Admitting: Nurse Practitioner

## 2024-09-27 DIAGNOSIS — F419 Anxiety disorder, unspecified: Secondary | ICD-10-CM

## 2024-09-28 NOTE — Telephone Encounter (Signed)
 Requested medications are due for refill today.  yes  Requested medications are on the active medications list.  yes  Last refill. 01/30/2024 #90 1 rf  Future visit scheduled.   no  Notes to clinic.  Refill not delegated.    Requested Prescriptions  Pending Prescriptions Disp Refills   ALPRAZolam  (XANAX ) 0.5 MG tablet [Pharmacy Med Name: ALPRAZOLAM  0.5 MG TABLET] 60 tablet     Sig: TAKE 1 TABLET BY MOUTH 2 TIMES DAILY AS NEEDED FOR ANXIETY.     Not Delegated - Psychiatry: Anxiolytics/Hypnotics 2 Failed - 09/28/2024  4:48 PM      Failed - This refill cannot be delegated      Failed - Urine Drug Screen completed in last 360 days      Failed - Valid encounter within last 6 months    Recent Outpatient Visits           3 months ago RUQ abdominal pain   [facility] Gareth Mliss FALCON, [place]              Passed - Patient is not pregnant

## 2024-10-04 ENCOUNTER — Other Ambulatory Visit: Payer: Self-pay | Admitting: Nurse Practitioner

## 2024-10-04 DIAGNOSIS — F331 Major depressive disorder, recurrent, moderate: Secondary | ICD-10-CM

## 2024-10-04 DIAGNOSIS — F419 Anxiety disorder, unspecified: Secondary | ICD-10-CM

## 2024-10-06 NOTE — Telephone Encounter (Signed)
 Requested by interface surescripts. Future visit 10/14/24.  Requested Prescriptions  Pending Prescriptions Disp Refills   sertraline  (ZOLOFT ) 25 MG tablet [Pharmacy Med Name: Sertraline  HCl 25 MG Oral Tablet] 90 tablet 1    Sig: TAKE 1 TABLET BY MOUTH DAILY     Psychiatry:  Antidepressants - SSRI - sertraline  Failed - 10/06/2024  2:30 PM      Failed - Valid encounter within last 6 months    Recent Outpatient Visits           3 months ago RUQ abdominal pain   [facility] Gareth Mliss FALCON, FNP              Passed - AST in normal range and within 360 days    AST  Date Value Ref Range Status  06/22/2024 15 0 - 40 IU/L Final         Passed - ALT in normal range and within 360 days    ALT  Date Value Ref Range Status  06/22/2024 17 0 - 32 IU/L Final         Passed - Completed PHQ-2 or PHQ-9 in the last 360 days

## 2024-10-13 NOTE — Progress Notes (Unsigned)
 Name: Terry Zhang   MRN: [id number]    DOB: 05/25/1991   Date:10/14/2024       Progress Note  Subjective  Chief Complaint: Annual Wellness Exam w/ PAP  Chief Complaint  Patient presents with   Annual Exam    HPI  Patient presents for annual CPE.  Diet: Limiting greasy foods and sugar. Tries to eat 3 well balanced meals a day Exercise: walks or does the elliptical machine 3 times a week for 30 minutes  Sleep: 7-8 hours per night  Last dental exam:10/08/24 Last eye exam: Reports needing to schedule   Flowsheet Row Office Visit from 10/14/2024 in [facility]  AUDIT-C Score 1    Depression: Phq 9 is  negative    10/14/2024    8:17 AM 06/22/2024    9:46 AM 01/30/2024    9:51 AM 10/10/2023    8:52 AM 09/10/2023    9:38 AM  Depression screen PHQ 2/9  Decreased Interest 0 0 0 0 0  Down, Depressed, Hopeless 0 0 0 0 0  PHQ - 2 Score 0 0 0 0 0  Altered sleeping 0 0 0 0   Tired, decreased energy 0 0 0 0   Change in appetite 0 0 0 0   Feeling bad or failure about yourself  0 0 0 0   Trouble concentrating 0 0 0 0   Moving slowly or fidgety/restless 0 0 0 0   Suicidal thoughts 0 0 0 0   PHQ-9 Score 0 0 0 0   Difficult doing work/chores  Not difficult at all Not difficult at all Not difficult at all    Hypertension: BP Readings from Last 3 Encounters:  10/14/24 122/76  06/22/24 122/78  01/30/24 116/78   Obesity: Wt Readings from Last 3 Encounters:  10/14/24 256 lb (116.1 kg)  06/22/24 258 lb 3.2 oz (117.1 kg)  01/30/24 288 lb 4.8 oz (130.8 kg)   BMI Readings from Last 3 Encounters:  10/14/24 38.92 kg/m  06/22/24 39.26 kg/m  01/30/24 43.84 kg/m     Vaccines:  HPV: up to at age 2 , ask insurance if age between 60-45  Shingrix: 37-64 yo and ask insurance if covered when patient above 76 yo Pneumonia:  educated and discussed with patient. Flu: Receiving today  Hep C Screening: 05/15/2023 STD testing and prevention  (HIV/chl/gon/syphilis): 10/13/2018 Intimate partner violence:no  Sexual History : sexually active with husband, on birth control  Menstrual History/LMP/Abnormal Bleeding: 09/21/24 Incontinence Symptoms: no   Breast cancer:  - Last Mammogram: Does not qualify  - BRCA gene screening: none   Osteoporosis: Discussed high calcium and vitamin D  supplementation, weight bearing exercises  Cervical cancer screening: performing today   Skin cancer: Discussed monitoring for atypical lesions  Colorectal cancer: Does not qualify    Lung cancer:  Does not qualify Low Dose CT Chest recommended if Age 4-80 years, 20 pack-year currently smoking OR have quit w/in 15years. Patient does not qualify.   ECG: 01/12/2021  Advanced Care Planning: A voluntary discussion about advance care planning including the explanation and discussion of advance directives.  Discussed health care proxy and Living will, and the patient was able to identify a health care proxy as husband .  Patient does not have a living will at present time. If patient does have living will, I have requested they bring this to the clinic to be scanned in to their chart.  Lipids: Lab Results  Component Value Date  CHOL 178 05/15/2023   CHOL 260 (A) 08/21/2022   CHOL 267 (H) 05/02/2021   Lab Results  Component Value Date   HDL 53 05/15/2023   HDL 60 08/21/2022   HDL 59 05/02/2021   Lab Results  Component Value Date   LDLCALC 97 05/15/2023   LDLCALC 164 08/21/2022   LDLCALC 167 (H) 05/02/2021   Lab Results  Component Value Date   TRIG 163 (H) 05/15/2023   TRIG 196 (A) 08/21/2022   TRIG 221 (H) 05/02/2021   Lab Results  Component Value Date   CHOLHDL 3.4 05/15/2023   CHOLHDL 4.5 (H) 05/02/2021   CHOLHDL 4.8 (H) 03/01/2020   No results found for: LDLDIRECT  Glucose: Glucose  Date Value Ref Range Status  06/22/2024 96 70 - 99 mg/dL Final  98/68/7974 92 70 - 99 mg/dL Final  94/83/7975 892 (H) 70 - 99 mg/dL Final   98/75/7986 891 (H) 65 - 99 mg/dL Final   Glucose, Bld  Date Value Ref Range Status  01/12/2021 108 (H) 70 - 99 mg/dL Final    Comment:    Glucose reference range applies only to samples taken after fasting for at least 8 hours.  12/22/2018 115 (H) 70 - 99 mg/dL Final  87/84/7980 95 70 - 99 mg/dL Final    Patient Active Problem List   Diagnosis Date Noted   Vitamin D  deficiency 11/15/2022   Chronic pansinusitis 11/15/2022   Moderate episode of recurrent major depressive disorder (HCC) 04/13/2020   Class 3 severe obesity due to excess calories with serious comorbidity and body mass index (BMI) of 45.0 to 49.9 in adult [facility]) 04/13/2020   Hyperlipidemia 02/23/2016   Anxiety disorder 08/03/2015    Past Surgical History:  Procedure Laterality Date   ANKLE SURGERY     BREAST BIOPSY  2003   CESAREAN SECTION N/A 12/28/2018   Procedure: CESAREAN SECTION;  Surgeon: Leonce Garnette BIRCH, MD;  Location: [facility] ORS;  Service: Obstetrics;  Laterality: N/A;   FRACTURE SURGERY  2016   Fracture of talas, screws inserted   SEPTOPLASTY N/A 08/17/2019   Procedure: SEPTOPLASTY;  Surgeon: Milissa Hamming, MD;  Location: [facility] ORS;  Service: ENT;  Laterality: N/A;   TONSILLECTOMY     TURBINATE REDUCTION Bilateral 08/17/2019   Procedure: TURBINATE REDUCTION;  Surgeon: Milissa Hamming, MD;  Location: [facility] ORS;  Service: ENT;  Laterality: Bilateral;    Family History  Problem Relation Age of Onset   Arthritis Mother    Cancer Mother 79       cervical   Hyperlipidemia Mother    Mental illness Mother    Diabetes Mother    Anxiety disorder Mother    Depression Mother    Hyperlipidemia Father    Hypertension Father    Mental illness Father    Diabetes Father        pre   Anxiety disorder Father    Arthritis Father    Depression Father    Mental illness Maternal Grandfather    Diabetes Maternal Grandfather    Anxiety disorder Maternal Grandfather    COPD Maternal Grandfather    Hearing  loss Maternal Grandfather    Obesity Maternal Grandfather    Vision loss Maternal Grandfather    Pancreatic cancer Maternal Grandmother 15   Cancer Maternal Grandmother    Early death Maternal Grandmother     Social History   Socioeconomic History   Marital status: Married    Spouse name: Lamar   Number of children: 2  Years of education: 47   Highest education level: Some college, no degree  Occupational History   Not on file  Tobacco Use   Smoking status: Every Day    Current packs/day: 0.25    Average packs/day: 0.3 packs/day for 10.0 years (2.5 ttl pk-yrs)    Types: Cigarettes   Smokeless tobacco: Never  Vaping Use   Vaping status: Never Used  Substance and Sexual Activity   Alcohol use: No   Drug use: No   Sexual activity: Yes    Birth control/protection: None  Other Topics Concern   Not on file  Social History Narrative   Married.   1 daughter.   Works as a Writer at [employer].   Once enjoyed playing sports, going to [employer], swimming.   Social Drivers of Corporate investment banker Strain: Low Risk  (10/13/2024)   Overall Financial Resource Strain (CARDIA)    Difficulty of Paying Living Expenses: Not very hard  Food Insecurity: No Food Insecurity (10/13/2024)   Hunger Vital Sign    Worried About Running Out of Food in the Last Year: Never true    Ran Out of Food in the Last Year: Never true  Transportation Needs: No Transportation Needs (10/13/2024)   PRAPARE - Administrator, Civil Service (Medical): No    Lack of Transportation (Non-Medical): No  Physical Activity: Insufficiently Active (10/13/2024)   Exercise Vital Sign    Days of Exercise per Week: 3 days    Minutes of Exercise per Session: 30 min  Stress: Stress Concern Present (10/13/2024)   [employer] of Occupational Health - Occupational Stress Questionnaire    Feeling of Stress: Rather much  Social Connections: Moderately Isolated (10/13/2024)   Social  Connection and Isolation Panel    Frequency of Communication with Friends and Family: More than three times a week    Frequency of Social Gatherings with Friends and Family: Twice a week    Attends Religious Services: Never    Database administrator or Organizations: No    Attends Engineer, structural: Not on file    Marital Status: Married  Catering manager Violence: Not At Risk (10/14/2024)   Humiliation, Afraid, Rape, and Kick questionnaire    Fear of Current or Ex-Partner: No    Emotionally Abused: No    Physically Abused: No    Sexually Abused: No     Current Outpatient Medications:    sertraline  (ZOLOFT ) 25 MG tablet, TAKE 1 TABLET BY MOUTH DAILY, Disp: 90 tablet, Rfl: 1   SPRINTEC 28 0.25-35 MG-MCG tablet, TAKE 1 TABLET BY MOUTH DAILY, Disp: 84 tablet, Rfl: 3   ALPRAZolam  (XANAX ) 0.5 MG tablet, Take 1 tablet (0.5 mg total) by mouth 2 (two) times daily as needed for anxiety., Disp: 90 tablet, Rfl: 1   ondansetron  (ZOFRAN -ODT) 4 MG disintegrating tablet, Take 1 tablet (4 mg total) by mouth every 8 (eight) hours as needed for nausea or vomiting., Disp: 20 tablet, Rfl: 0   semaglutide -weight management (WEGOVY ) 1 MG/0.5ML SOAJ SQ injection, Inject 1 mg into the skin once a week., Disp: 6 mL, Rfl: 0   sertraline  (ZOLOFT ) 100 MG tablet, Take 1 tablet (100 mg total) by mouth daily., Disp: 90 tablet, Rfl: 3   simvastatin  (ZOCOR ) 20 MG tablet, Take 1 tablet (20 mg total) by mouth at bedtime., Disp: 90 tablet, Rfl: 3  Allergies  Allergen Reactions   Montelukast Palpitations    anxiety     ROS  Constitutional: Negative for fever or weight change.  Respiratory: Negative for cough and shortness of breath.   Cardiovascular: Negative for chest pain or palpitations.  Gastrointestinal: Negative for abdominal pain, no bowel changes.  Musculoskeletal: Negative for gait problem or joint swelling.  Skin: Negative for rash.  Neurological: Negative for dizziness or headache.  No  other specific complaints in a complete review of systems (except as listed in HPI above).   Objective  Vitals:   10/14/24 0818  BP: 122/76  Pulse: 63  Resp: 16  SpO2: 98%  Weight: 256 lb (116.1 kg)  Height: 5' 8 (1.727 m)    Body mass index is 38.92 kg/m.  Physical Exam Exam conducted with a chaperone present.  Constitutional:      Appearance: Normal appearance.  HENT:     Head: Normocephalic and atraumatic.     Right Ear: Tympanic membrane normal.     Left Ear: Tympanic membrane normal.     Nose: Nose normal.     Mouth/Throat:     Mouth: Mucous membranes are moist.  Eyes:     Pupils: Pupils are equal, round, and reactive to light.  Cardiovascular:     Rate and Rhythm: Normal rate and regular rhythm.     Pulses: Normal pulses.     Heart sounds: Normal heart sounds.  Pulmonary:     Effort: Pulmonary effort is normal.     Breath sounds: Normal breath sounds.  Chest:  Breasts:    Right: Normal.     Left: Normal.  Abdominal:     General: Abdomen is flat. Bowel sounds are normal.     Palpations: Abdomen is soft.  Genitourinary:    Exam position: Lithotomy position.     Vagina: Normal. No vaginal discharge.     Cervix: Normal.     Uterus: Normal.      Adnexa: Right adnexa normal.  Musculoskeletal:        General: Normal range of motion.     Cervical back: Normal range of motion and neck supple.  Skin:    General: Skin is warm and dry.  Neurological:     General: No focal deficit present.     Mental Status: She is alert and oriented to person, place, and time.  Psychiatric:        Mood and Affect: Mood normal.        Behavior: Behavior normal.        Thought Content: Thought content normal.        Judgment: Judgment normal.      No results found for this or any previous visit (from the past 2160 hours).   Fall Risk:    10/14/2024    8:17 AM 06/22/2024    9:46 AM 01/30/2024    9:30 AM 10/10/2023    8:52 AM 09/10/2023    9:37 AM  Fall Risk   Falls  in the past year? 0 0 0 0 0  Number falls in past yr: 0 0 0 0 0  Injury with Fall? 0 0 0 0 0  Risk for fall due to : No Fall Risks   No Fall Risks   Follow up Falls prevention discussed Falls evaluation completed Falls evaluation completed Falls prevention discussed;Education provided;Falls evaluation completed      Functional Status Survey: Is the patient deaf or have difficulty hearing?: No Does the patient have difficulty seeing, even when wearing glasses/contacts?: No Does the patient have difficulty concentrating, remembering, or making decisions?: No  Does the patient have difficulty walking or climbing stairs?: No Does the patient have difficulty dressing or bathing?: No Does the patient have difficulty doing errands alone such as visiting a doctor's office or shopping?: No   Assessment & Plan  Problem List Items Addressed This Visit       Other   Anxiety disorder   Refill for Sertaline 100 mg sent to pharmacy along with Alprazolam  0.5 mg 2 times daily as needed for anxiety       Relevant Medications   sertraline  (ZOLOFT ) 100 MG tablet   ALPRAZolam  (XANAX ) 0.5 MG tablet   Hyperlipidemia   Checking lipid panel. Refill sent in for simvastatin  20 mg. Continue working on limiting saturated fats in diet.       Relevant Medications   simvastatin  (ZOCOR ) 20 MG tablet   Other Relevant Orders   Lipid panel   Moderate episode of recurrent major depressive disorder (HCC)   Sertaline 100 mg sent to pharmacy       Relevant Medications   sertraline  (ZOLOFT ) 100 MG tablet   ALPRAZolam  (XANAX ) 0.5 MG tablet   Class 3 severe obesity due to excess calories with serious comorbidity and body mass index (BMI) of 45.0 to 49.9 in adult (HCC)   Weight today 256 lb with a BMI of 38.92. Wegovy  1 mg injection sent to pharmacy  - Encourage continuation of lifestyle modifications, including dietary management and regular exercise. -continue to increase physical activity, getting at least 150  min of physical activity a week.  Work on including Runner, broadcasting/film/video 2 days a week.  - continue eating at a calorie deficit 1600-1700 cal a day, eating a well balanced diet with whole foods, avoiding processed foods.   Patient is motivated to continue working on lifestyle modification.        Relevant Medications   semaglutide -weight management (WEGOVY ) 1 MG/0.5ML SOAJ SQ injection   ondansetron  (ZOFRAN -ODT) 4 MG disintegrating tablet   Other Visit Diagnoses       Annual physical exam    -  Primary   Checking routine lab work today. Pap completed at today's visit.   Relevant Orders   CBC with Differential/Platelet   Comprehensive metabolic panel with GFR   Lipid panel   Hemoglobin A1c   TSH     Screening for diabetes mellitus       Checking hgbA1c   Relevant Orders   Hemoglobin A1c     Screening for deficiency anemia       Checking CBC   Relevant Orders   CBC with Differential/Platelet     Screening for thyroid disorder       Checking TSH   Relevant Orders   TSH     Cervical cancer screening       Relevant Orders   Pap LB (liquid-based)     Immunization due       Relevant Orders   Flu vaccine trivalent PF, 6mos and older(Flulaval,Afluria,Fluarix,Fluzone) (Completed)      Refills sent in  -USPSTF grade A and B recommendations reviewed with patient; age-appropriate recommendations, preventive care, screening tests, etc discussed and encouraged; healthy living encouraged; see AVS for patient education given to patient -Discussed importance of 150 minutes of physical activity weekly, eat two servings of fish weekly, eat one serving of tree nuts ( cashews, pistachios, pecans, almonds.SABRA) every other day, eat 6 servings of fruit/vegetables daily and drink plenty of water and avoid sweet beverages.   -Reviewed Health Maintenance: YES  I have  reviewed this encounter including the documentation in this note and/or discussed this patient with the provider, Sherine Cortese, SNP, I  am certifying that I agree with the content of this note as supervising/preceptor nurse practitioner.  Mliss Spray, FNP-C [facility] 10/14/2024, 9:36 AM

## 2024-10-14 ENCOUNTER — Encounter: Payer: Self-pay | Admitting: Nurse Practitioner

## 2024-10-14 ENCOUNTER — Ambulatory Visit (INDEPENDENT_AMBULATORY_CARE_PROVIDER_SITE_OTHER): Admitting: Nurse Practitioner

## 2024-10-14 VITALS — BP 122/76 | HR 63 | Resp 16 | Ht 68.0 in | Wt 256.0 lb

## 2024-10-14 DIAGNOSIS — E782 Mixed hyperlipidemia: Secondary | ICD-10-CM | POA: Diagnosis not present

## 2024-10-14 DIAGNOSIS — Z1329 Encounter for screening for other suspected endocrine disorder: Secondary | ICD-10-CM

## 2024-10-14 DIAGNOSIS — F419 Anxiety disorder, unspecified: Secondary | ICD-10-CM

## 2024-10-14 DIAGNOSIS — Z6841 Body Mass Index (BMI) 40.0 and over, adult: Secondary | ICD-10-CM

## 2024-10-14 DIAGNOSIS — Z0001 Encounter for general adult medical examination with abnormal findings: Secondary | ICD-10-CM | POA: Diagnosis not present

## 2024-10-14 DIAGNOSIS — Z130 Encounter for screening for diseases of the blood and blood-forming organs and certain disorders involving the immune mechanism: Secondary | ICD-10-CM

## 2024-10-14 DIAGNOSIS — Z131 Encounter for screening for diabetes mellitus: Secondary | ICD-10-CM

## 2024-10-14 DIAGNOSIS — Z124 Encounter for screening for malignant neoplasm of cervix: Secondary | ICD-10-CM

## 2024-10-14 DIAGNOSIS — F331 Major depressive disorder, recurrent, moderate: Secondary | ICD-10-CM | POA: Diagnosis not present

## 2024-10-14 DIAGNOSIS — E66813 Obesity, class 3: Secondary | ICD-10-CM

## 2024-10-14 DIAGNOSIS — Z23 Encounter for immunization: Secondary | ICD-10-CM | POA: Diagnosis not present

## 2024-10-14 DIAGNOSIS — Z0000 Encounter for general adult medical examination without abnormal findings: Secondary | ICD-10-CM

## 2024-10-14 MED ORDER — SERTRALINE HCL 100 MG PO TABS: 100.0000 mg | ORAL_TABLET | Freq: Every day | ORAL | 3 refills | Status: AC

## 2024-10-14 MED ORDER — ONDANSETRON 4 MG PO TBDP: 4.0000 mg | ORAL_TABLET | Freq: Three times a day (TID) | ORAL | 0 refills | Status: AC | PRN

## 2024-10-14 MED ORDER — SIMVASTATIN 20 MG PO TABS: 20.0000 mg | ORAL_TABLET | Freq: Every day | ORAL | 3 refills | Status: AC

## 2024-10-14 MED ORDER — WEGOVY 1 MG/0.5ML ~~LOC~~ SOAJ: 1.0000 mg | SUBCUTANEOUS | 0 refills | Status: DC

## 2024-10-14 MED ORDER — ALPRAZOLAM 0.5 MG PO TABS
0.5000 mg | ORAL_TABLET | Freq: Two times a day (BID) | ORAL | 1 refills | Status: AC | PRN
Start: 2024-10-14 — End: ?

## 2024-10-14 NOTE — Assessment & Plan Note (Signed)
 Checking lipid panel. Refill sent in for simvastatin  20 mg. Continue working on limiting saturated fats in diet.

## 2024-10-14 NOTE — Assessment & Plan Note (Addendum)
 Refill for Sertaline 100 mg sent to pharmacy along with Alprazolam  0.5 mg 2 times daily as needed for anxiety

## 2024-10-14 NOTE — Assessment & Plan Note (Addendum)
 Weight today 256 lb with a BMI of 38.92. Wegovy  1 mg injection sent to pharmacy  - Encourage continuation of lifestyle modifications, including dietary management and regular exercise. -continue to increase physical activity, getting at least 150 min of physical activity a week.  Work on including Runner, broadcasting/film/video 2 days a week.  - continue eating at a calorie deficit 1600-1700 cal a day, eating a well balanced diet with whole foods, avoiding processed foods.   Patient is motivated to continue working on lifestyle modification.

## 2024-10-14 NOTE — Assessment & Plan Note (Signed)
 Sertaline 100 mg sent to pharmacy

## 2024-10-20 LAB — PAP LB (LIQUID-BASED)

## 2024-11-11 ENCOUNTER — Ambulatory Visit: Payer: Self-pay | Admitting: Nurse Practitioner

## 2024-11-11 LAB — COMPREHENSIVE METABOLIC PANEL WITH GFR
ALT: 20 IU/L (ref 0–32)
AST: 17 IU/L (ref 0–40)
Albumin: 4.7 g/dL (ref 3.9–4.9)
Alkaline Phosphatase: 55 IU/L (ref 41–116)
BUN/Creatinine Ratio: 16 (ref 9–23)
BUN: 9 mg/dL (ref 6–20)
Bilirubin Total: 0.3 mg/dL (ref 0.0–1.2)
CO2: 23 mmol/L (ref 20–29)
Calcium: 9.6 mg/dL (ref 8.7–10.2)
Chloride: 102 mmol/L (ref 96–106)
Creatinine, Ser: 0.57 mg/dL (ref 0.57–1.00)
Globulin, Total: 2.5 g/dL (ref 1.5–4.5)
Glucose: 89 mg/dL (ref 70–99)
Potassium: 4.2 mmol/L (ref 3.5–5.2)
Sodium: 139 mmol/L (ref 134–144)
Total Protein: 7.2 g/dL (ref 6.0–8.5)
eGFR: 121 mL/min/1.73 (ref >59)

## 2024-11-11 LAB — CBC WITH DIFFERENTIAL/PLATELET
Basophils Absolute: 0 x10E3/uL (ref 0.0–0.2)
Basos: 0 %
EOS (ABSOLUTE): 0.1 x10E3/uL (ref 0.0–0.4)
Eos: 2 %
Hematocrit: 46.5 % (ref 34.0–46.6)
Hemoglobin: 15.3 g/dL (ref 11.1–15.9)
Immature Grans (Abs): 0 x10E3/uL (ref 0.0–0.1)
Immature Granulocytes: 0 %
Lymphocytes Absolute: 3.4 x10E3/uL — ABNORMAL HIGH (ref 0.7–3.1)
Lymphs: 39 %
MCH: 29.8 pg (ref 26.6–33.0)
MCHC: 32.9 g/dL (ref 31.5–35.7)
MCV: 91 fL (ref 79–97)
Monocytes Absolute: 0.6 x10E3/uL (ref 0.1–0.9)
Monocytes: 7 %
Neutrophils Absolute: 4.5 x10E3/uL (ref 1.4–7.0)
Neutrophils: 52 %
Platelets: 248 x10E3/uL (ref 150–450)
RBC: 5.14 x10E6/uL (ref 3.77–5.28)
RDW: 12.7 % (ref 11.7–15.4)
WBC: 8.6 x10E3/uL (ref 3.4–10.8)

## 2024-11-11 LAB — LIPID PANEL
Chol/HDL Ratio: 4.7 ratio — ABNORMAL HIGH (ref 0.0–4.4)
Cholesterol, Total: 225 mg/dL — ABNORMAL HIGH (ref 100–199)
HDL: 48 mg/dL (ref >39)
LDL Chol Calc (NIH): 159 mg/dL — ABNORMAL HIGH (ref 0–99)
Triglycerides: 100 mg/dL (ref 0–149)
VLDL Cholesterol Cal: 18 mg/dL (ref 5–40)

## 2024-11-11 LAB — HEMOGLOBIN A1C
Est. average glucose Bld gHb Est-mCnc: 111 mg/dL
Hgb A1c MFr Bld: 5.5 % (ref 4.8–5.6)

## 2024-11-11 LAB — TSH: TSH: 1.2 u[IU]/mL (ref 0.450–4.500)

## 2025-01-13 ENCOUNTER — Other Ambulatory Visit: Payer: Self-pay | Admitting: Nurse Practitioner

## 2025-01-13 DIAGNOSIS — Z6841 Body Mass Index (BMI) 40.0 and over, adult: Secondary | ICD-10-CM

## 2025-01-14 NOTE — Telephone Encounter (Signed)
 Requested Prescriptions  Pending Prescriptions Disp Refills   WEGOVY  1 MG/0.5ML SOAJ SQ injection [Pharmacy Med Name: WEGOVY  1 MG/0.5 ML PEN] 6 mL 3    Sig: INJECT 1MG  INTO THE SKIN ONCE A WEEK     Endocrinology:  Diabetes - GLP-1 Receptor Agonists - semaglutide  Passed - 01/14/2025 10:30 AM      Passed - HBA1C in normal range and within 180 days    Hemoglobin A1C  Date Value Ref Range Status  08/21/2022 5.9  Final   Hgb A1c MFr Bld  Date Value Ref Range Status  11/10/2024 5.5 4.8 - 5.6 % Final    Comment:             Prediabetes: 5.7 - 6.4          Diabetes: >6.4          Glycemic control for adults with diabetes: <7.0          Passed - Cr in normal range and within 360 days    Creatinine  Date Value Ref Range Status  01/23/2012 0.67 0.60 - 1.30 mg/dL Final   Creatinine, Ser  Date Value Ref Range Status  11/10/2024 0.57 0.57 - 1.00 mg/dL Final   Creatinine, Urine  Date Value Ref Range Status  12/22/2018 385 mg/dL Final         Passed - Valid encounter within last 6 months    Recent Outpatient Visits           3 months ago Annual physical exam   [facility] Gareth Mliss [place], FNP   6 months ago RUQ abdominal pain   [facility] Gareth Mliss [place], [place]

## 2025-10-18 ENCOUNTER — Encounter: Admitting: Nurse Practitioner
